# Patient Record
Sex: Male | Born: 1935 | Race: White | Hispanic: No | Smoking: Former smoker
Health system: Southern US, Community
[De-identification: ages and names within clinical notes are randomized; demographics above are authoritative.]

## PROBLEM LIST (undated history)

## (undated) DIAGNOSIS — E039 Hypothyroidism, unspecified: Secondary | ICD-10-CM

## (undated) DIAGNOSIS — I2510 Atherosclerotic heart disease of native coronary artery without angina pectoris: Secondary | ICD-10-CM

## (undated) DIAGNOSIS — N189 Chronic kidney disease, unspecified: Secondary | ICD-10-CM

## (undated) DIAGNOSIS — I252 Old myocardial infarction: Secondary | ICD-10-CM

## (undated) DIAGNOSIS — I10 Essential (primary) hypertension: Secondary | ICD-10-CM

## (undated) DIAGNOSIS — K219 Gastro-esophageal reflux disease without esophagitis: Secondary | ICD-10-CM

## (undated) DIAGNOSIS — E785 Hyperlipidemia, unspecified: Secondary | ICD-10-CM

## (undated) DIAGNOSIS — M069 Rheumatoid arthritis, unspecified: Secondary | ICD-10-CM

## (undated) HISTORY — DX: Hyperlipidemia, unspecified: E78.5

## (undated) HISTORY — DX: Rheumatoid arthritis, unspecified: M06.9

## (undated) HISTORY — DX: Hypothyroidism, unspecified: E03.9

## (undated) HISTORY — DX: Old myocardial infarction: I25.2

## (undated) HISTORY — PX: CHOLECYSTECTOMY: SHX55

## (undated) HISTORY — DX: Essential (primary) hypertension: I10

## (undated) HISTORY — DX: Atherosclerotic heart disease of native coronary artery without angina pectoris: I25.10

## (undated) HISTORY — PX: HEMILAMINOTOMY LUMBAR SPINE: SUR654

## (undated) HISTORY — DX: Chronic kidney disease, unspecified: N18.9

## (undated) HISTORY — DX: Gastro-esophageal reflux disease without esophagitis: K21.9

## (undated) SURGERY — Surgical Case
Anesthesia: *Unknown

---

## 2004-01-14 ENCOUNTER — Encounter: Admission: RE | Admit: 2004-01-14 | Discharge: 2004-01-14 | Payer: Self-pay | Admitting: Internal Medicine

## 2004-12-11 ENCOUNTER — Ambulatory Visit (HOSPITAL_COMMUNITY): Admission: RE | Admit: 2004-12-11 | Discharge: 2004-12-11 | Payer: Self-pay | Admitting: Gastroenterology

## 2010-09-02 ENCOUNTER — Encounter: Payer: Self-pay | Admitting: Neurosurgery

## 2013-02-09 ENCOUNTER — Other Ambulatory Visit: Payer: Self-pay | Admitting: Internal Medicine

## 2013-02-09 DIAGNOSIS — M549 Dorsalgia, unspecified: Secondary | ICD-10-CM

## 2013-02-15 ENCOUNTER — Ambulatory Visit
Admission: RE | Admit: 2013-02-15 | Discharge: 2013-02-15 | Disposition: A | Discharge status: Home / Self Care | Payer: Medicare Other | Source: Ambulatory Visit | Attending: Internal Medicine | Admitting: Internal Medicine

## 2013-02-15 DIAGNOSIS — M549 Dorsalgia, unspecified: Secondary | ICD-10-CM

## 2013-04-22 ENCOUNTER — Ambulatory Visit: Payer: Medicare Other | Admitting: Rehabilitation

## 2013-04-26 ENCOUNTER — Ambulatory Visit: Payer: Medicare Other | Attending: Neurosurgery | Admitting: Rehabilitation

## 2013-04-26 DIAGNOSIS — M545 Low back pain, unspecified: Secondary | ICD-10-CM | POA: Insufficient documentation

## 2013-04-26 DIAGNOSIS — IMO0001 Reserved for inherently not codable concepts without codable children: Secondary | ICD-10-CM | POA: Insufficient documentation

## 2013-04-29 ENCOUNTER — Ambulatory Visit: Payer: Medicare Other | Admitting: Rehabilitation

## 2013-05-03 ENCOUNTER — Ambulatory Visit: Payer: Medicare Other | Admitting: Rehabilitation

## 2013-05-06 ENCOUNTER — Ambulatory Visit: Payer: Medicare Other | Admitting: Rehabilitation

## 2013-05-10 ENCOUNTER — Ambulatory Visit: Payer: Medicare Other | Admitting: Rehabilitation

## 2013-05-13 ENCOUNTER — Ambulatory Visit: Payer: Medicare Other | Attending: Neurosurgery | Admitting: Rehabilitation

## 2013-05-13 DIAGNOSIS — M545 Low back pain, unspecified: Secondary | ICD-10-CM | POA: Insufficient documentation

## 2013-05-13 DIAGNOSIS — IMO0001 Reserved for inherently not codable concepts without codable children: Secondary | ICD-10-CM | POA: Insufficient documentation

## 2013-05-17 ENCOUNTER — Ambulatory Visit: Payer: Medicare Other | Admitting: Rehabilitation

## 2013-05-20 ENCOUNTER — Ambulatory Visit: Payer: Medicare Other | Admitting: Rehabilitation

## 2013-05-24 ENCOUNTER — Ambulatory Visit: Payer: Medicare Other | Admitting: Rehabilitation

## 2013-05-31 ENCOUNTER — Ambulatory Visit: Payer: Medicare Other | Admitting: Rehabilitation

## 2013-06-02 ENCOUNTER — Ambulatory Visit: Payer: Medicare Other | Admitting: Cardiology

## 2013-06-03 ENCOUNTER — Ambulatory Visit: Payer: Medicare Other | Admitting: Rehabilitation

## 2013-06-07 ENCOUNTER — Ambulatory Visit: Payer: Medicare Other | Admitting: Rehabilitation

## 2013-06-10 ENCOUNTER — Ambulatory Visit: Payer: Medicare Other | Admitting: Rehabilitation

## 2013-06-14 ENCOUNTER — Ambulatory Visit: Payer: Medicare Other | Admitting: Rehabilitation

## 2013-06-15 ENCOUNTER — Ambulatory Visit: Payer: Medicare Other | Attending: *Deleted | Admitting: Rehabilitation

## 2013-06-15 DIAGNOSIS — M545 Low back pain, unspecified: Secondary | ICD-10-CM | POA: Insufficient documentation

## 2013-06-15 DIAGNOSIS — IMO0001 Reserved for inherently not codable concepts without codable children: Secondary | ICD-10-CM | POA: Diagnosis present

## 2013-06-17 ENCOUNTER — Ambulatory Visit: Payer: Medicare Other | Admitting: Rehabilitation

## 2013-06-17 DIAGNOSIS — IMO0001 Reserved for inherently not codable concepts without codable children: Secondary | ICD-10-CM | POA: Diagnosis not present

## 2013-06-22 ENCOUNTER — Ambulatory Visit: Payer: Medicare Other | Admitting: Rehabilitation

## 2013-06-22 DIAGNOSIS — IMO0001 Reserved for inherently not codable concepts without codable children: Secondary | ICD-10-CM | POA: Diagnosis not present

## 2013-06-24 ENCOUNTER — Ambulatory Visit: Payer: Medicare Other | Admitting: Rehabilitation

## 2013-06-24 DIAGNOSIS — IMO0001 Reserved for inherently not codable concepts without codable children: Secondary | ICD-10-CM | POA: Diagnosis not present

## 2013-06-30 ENCOUNTER — Ambulatory Visit: Payer: Medicare Other | Admitting: Rehabilitation

## 2013-06-30 DIAGNOSIS — IMO0001 Reserved for inherently not codable concepts without codable children: Secondary | ICD-10-CM | POA: Diagnosis not present

## 2013-07-05 ENCOUNTER — Ambulatory Visit: Payer: Medicare Other | Admitting: Rehabilitation

## 2013-07-05 DIAGNOSIS — IMO0001 Reserved for inherently not codable concepts without codable children: Secondary | ICD-10-CM | POA: Diagnosis not present

## 2013-07-15 ENCOUNTER — Ambulatory Visit: Payer: Medicare Other | Attending: *Deleted | Admitting: Rehabilitation

## 2013-07-15 DIAGNOSIS — IMO0001 Reserved for inherently not codable concepts without codable children: Secondary | ICD-10-CM | POA: Insufficient documentation

## 2013-07-15 DIAGNOSIS — M545 Low back pain, unspecified: Secondary | ICD-10-CM | POA: Insufficient documentation

## 2013-07-19 ENCOUNTER — Encounter: Payer: Self-pay | Admitting: Cardiology

## 2013-07-19 ENCOUNTER — Encounter: Payer: Self-pay | Admitting: *Deleted

## 2013-07-20 ENCOUNTER — Ambulatory Visit: Payer: Medicare Other | Admitting: Rehabilitation

## 2013-07-20 ENCOUNTER — Ambulatory Visit (INDEPENDENT_AMBULATORY_CARE_PROVIDER_SITE_OTHER): Payer: Medicare Other | Admitting: Cardiology

## 2013-07-20 ENCOUNTER — Encounter: Payer: Self-pay | Admitting: Cardiology

## 2013-07-20 VITALS — BP 126/76 | HR 48 | Ht 69.0 in | Wt 196.0 lb

## 2013-07-20 DIAGNOSIS — I208 Other forms of angina pectoris: Secondary | ICD-10-CM

## 2013-07-20 DIAGNOSIS — I10 Essential (primary) hypertension: Secondary | ICD-10-CM

## 2013-07-20 DIAGNOSIS — I252 Old myocardial infarction: Secondary | ICD-10-CM

## 2013-07-20 DIAGNOSIS — I25810 Atherosclerosis of coronary artery bypass graft(s) without angina pectoris: Secondary | ICD-10-CM

## 2013-07-20 DIAGNOSIS — E785 Hyperlipidemia, unspecified: Secondary | ICD-10-CM

## 2013-07-20 NOTE — Progress Notes (Signed)
[address], [address] [place], [place]  [address] Phone: [phone] Fax:  [phone]  Date:  07/20/2013   ID:  Connor Hansen, DOB 04-22-1934, MRN [id number]  PCP:  Georgann Housekeeper, MD   History of Present Illness: Connor Hansen is a 79 y.o. male Several years ago he had a myocardial infarction treated with thrombolysis. Does not recall a heart catheterization at that time. More recently in 2010 was admitted to the hospital via emergency room with equivocal ST segment changes and chest discomfort. Cardiac catheterization at that time demonstrated a high grade mid LAD lesion in addition to a 99% subtotal length the mid right coronary artery lesion. It was difficult to ascertain which one was the culprit and a ventriculogram was done prior to intervention that demonstrated a modest size apical hypokinesis. Accordingly a stent was directed to the mid LAD lesion, bare-metal stent. The right coronary artery was also stented with a bare-metal stent at that time because it was felt that this lesion may occlude in a short period of time. This was done in February of 2010 with procedure performed at [facility]. Has established care here. Echocardiogram done on Jan 03, 2009 shows mild LVH, normal EF, distal anteroseptal and apical akinesis consistent with myocardial infarction, mild aortic sclerosis. Trivial mitral regurgitation and tricuspid regurgitation. Overall his LDL is 78, well controlled on atorvastatin 80. We went over goals. His hypertension is also well controlled. He occasionally will feel palpitations but this is quite rare. He has been having some back pain, this limits his playing of golf.   Wt Readings from Last 3 Encounters:  07/20/13 196 lb (88.905 kg)     Past Medical History  Diagnosis Date  . HTN (hypertension)   . GERD (gastroesophageal reflux disease)   . Hyperlipidemia   . Back pain   . Hypothyroidism   . Coronary atherosclerosis of native coronary artery   .  Obesity   . RA (rheumatoid arthritis)   . Palpitation   . CKD (chronic kidney disease)   . Old myocardial infarct   . CAD (coronary artery disease)     2/10 - BMS to mid LAD, RCA. Anteroapical akinesis - normal EF. 2010    Past Surgical History  Procedure Laterality Date  . Cholecystectomy    . Hemilaminotomy lumbar spine      Current Outpatient Prescriptions  Medication Sig Dispense Refill  . allopurinol (ZYLOPRIM) 100 MG tablet Take 100 mg by mouth daily.      Marland Kitchen aspirin 81 MG tablet Take 81 mg by mouth daily.      Marland Kitchen atorvastatin (LIPITOR) 80 MG tablet Take 80 mg by mouth daily.      . calcium & magnesium carbonates (MYLANTA) 311-232 MG per tablet Take 1 tablet by mouth daily.      . carvedilol (COREG) 6.25 MG tablet Take 6.25 mg by mouth 2 (two) times daily with a meal.      . Cholecalciferol (VITAMIN D) 400 UNITS capsule Take 400 Units by mouth daily.      . famotidine (PEPCID) 20 MG tablet Take 20 mg by mouth 2 (two) times daily.      . Flaxseed Oil OIL 1,000 mg by Does not apply route daily.      . folic acid (FOLVITE) 1 MG tablet Take 2 mg by mouth 2 (two) times daily.      Marland Kitchen levothyroxine (SYNTHROID, LEVOTHROID) 75 MCG tablet Take 75 mcg by mouth  daily before breakfast.      . methotrexate (RHEUMATREX) 2.5 MG tablet Take 20 mg by mouth once a week. Caution:Chemotherapy. Protect from light.      . Multiple Vitamin (MULTIVITAMIN) tablet Take 1 tablet by mouth daily.      . Multiple Vitamins-Minerals (PRESERVISION/LUTEIN PO) Take 500 mg by mouth daily.      . Omega-3 Fatty Acids (FISH OIL PO) Take 1,000 capsules by mouth daily.      Marland Kitchen omeprazole (PRILOSEC) 20 MG capsule Take 20 mg by mouth daily.      . traMADol (ULTRAM) 50 MG tablet Take 50 mg by mouth as needed.       No current facility-administered medications for this visit.    Allergies:   No Known Allergies  Social History:  The patient  reports that he has quit smoking. His smoking use included Cigarettes. He smoked  0.00 packs per day for 25 years. He does not have any smokeless tobacco history on file. He reports that he drinks alcohol. He reports that he does not use illicit drugs.   ROS:  Please see the history of present illness.   No syncope, no dizziness, no bleeding, no orthopnea, no PND    PHYSICAL EXAM: VS:  BP 126/76  Pulse 48  Ht 5\' 9"  (1.753 m)  Wt 196 lb (88.905 kg)  BMI 28.93 kg/m2 Well nourished, well developed, in no acute distress HEENT: normal Neck: no JVD Cardiac:  normal S1, S2, mildly bradycardic; RRR; no murmur Lungs:  clear to auscultation bilaterally, no wheezing, rhonchi or rales Abd: soft, nontender, no hepatomegaly Ext: no edema Skin: warm and dry Neuro: no focal abnormalities noted  EKG:  Sinus bradycardia rate 48, inferior infarct pattern, poor R wave progression     ASSESSMENT AND PLAN:  1.  coronary artery disease-status post PCI-currently doing very well, no anginal symptoms. Continue with aggressive secondary prevention. 2. Angina-well controlled. We discussed the possibility of repeat revascularization in the future if necessary. Hopefully with aggressive secondary prevention this may not be an issue. 3. Hyperlipidemia-continue with high-dose statin therapy. No changes made. LDL excellent in may of 2014. Lab work reviewed. 4. Sinus bradycardia-heart rate on EKG today 48 when laying down, when sitting up he was in the upper 50s. At previous doctor visits he has been in the 60s and 70s. I will not make any adjustments to his carvedilol, low-dose currently. We will continue to monitor. He is not having any active symptoms with this.  Signed, Donato Schultz, MD [facility]  07/20/2013 11:05 AM

## 2013-07-20 NOTE — Patient Instructions (Signed)
Your physician recommends that you continue on your current medications as directed. Please refer to the Current Medication list given to you today.  Your physician wants you to follow-up in: 1 year follow-up. You will receive a reminder letter in the mail two months in advance. If you don't receive a letter, please call our office to schedule the follow-up appointment.

## 2013-07-27 ENCOUNTER — Ambulatory Visit: Payer: Medicare Other | Admitting: Rehabilitation

## 2013-08-02 ENCOUNTER — Ambulatory Visit: Payer: Medicare Other | Admitting: Rehabilitation

## 2013-08-11 ENCOUNTER — Ambulatory Visit: Payer: Medicare Other | Admitting: Rehabilitation

## 2014-02-15 ENCOUNTER — Other Ambulatory Visit: Payer: Self-pay | Admitting: *Deleted

## 2014-02-15 MED ORDER — CARVEDILOL 6.25 MG PO TABS: 6.2500 mg | ORAL_TABLET | Freq: Two times a day (BID) | ORAL | Status: DC

## 2014-06-09 ENCOUNTER — Telehealth: Payer: Self-pay | Admitting: Cardiology

## 2014-06-09 NOTE — Telephone Encounter (Signed)
Received request from Nurse fax box, documents faxed for surgical clearance. To: [facility]  Fax number: [phone] Attention: 10.29.15/km

## 2014-07-20 ENCOUNTER — Encounter: Payer: Self-pay | Admitting: Cardiology

## 2014-07-20 ENCOUNTER — Ambulatory Visit (INDEPENDENT_AMBULATORY_CARE_PROVIDER_SITE_OTHER): Payer: Medicare Other | Admitting: Cardiology

## 2014-07-20 VITALS — BP 120/78 | HR 55 | Ht 69.0 in | Wt 188.0 lb

## 2014-07-20 DIAGNOSIS — I208 Other forms of angina pectoris: Secondary | ICD-10-CM

## 2014-07-20 DIAGNOSIS — I2510 Atherosclerotic heart disease of native coronary artery without angina pectoris: Secondary | ICD-10-CM | POA: Insufficient documentation

## 2014-07-20 DIAGNOSIS — I2583 Coronary atherosclerosis due to lipid rich plaque: Principal | ICD-10-CM

## 2014-07-20 DIAGNOSIS — E785 Hyperlipidemia, unspecified: Secondary | ICD-10-CM

## 2014-07-20 DIAGNOSIS — I10 Essential (primary) hypertension: Secondary | ICD-10-CM

## 2014-07-20 NOTE — Progress Notes (Signed)
[place]. [address], [address] [place], [place]  [address] Phone: [phone] Fax:  [phone]  Date:  07/20/2014   ID:  Connor Hansen, DOB 02-May-1934, MRN [id number]  PCP:  Wenda Low, MD   History of Present Illness: Connor Hansen is a 80 y.o. male who several years ago he had a myocardial infarction treated with thrombolysis. Does not recall a heart catheterization at that time. More recently in 2010 was admitted to the hospital via emergency room with equivocal ST segment changes and chest discomfort. Cardiac catheterization in 2010 at that time demonstrated a high grade mid LAD lesion in addition to a 99% subtotal length the mid right coronary artery lesion. It was difficult to ascertain which one was the culprit and a ventriculogram was done prior to intervention that demonstrated a modest size apical hypokinesis. Accordingly a stent was directed to the mid LAD lesion, bare-metal stent. The right coronary artery was also stented with a bare-metal stent at that time because it was felt that this lesion may occlude in a short period of time. This was done in February of 2010 with procedure performed at [facility].  Has established care here. Echocardiogram done on Jan 03, 2009 shows mild LVH, normal EF, distal anteroseptal and apical akinesis consistent with myocardial infarction, mild aortic sclerosis. Trivial mitral regurgitation and tricuspid regurgitation.  Overall his LDL has been 78, well controlled on atorvastatin 80. We went over goals. His hypertension is also well controlled. He occasionally will feel palpitations but this is quite rare. He has been having some back pain, this limits his playing of golf.  Cataract surgery Monday. No chest pain, no anginal symptoms. Doing well.   Wt Readings from Last 3 Encounters:  07/20/14 188 lb (85.276 kg)  07/20/13 196 lb (88.905 kg)     Past Medical History  Diagnosis Date  . HTN (hypertension)   . GERD (gastroesophageal  reflux disease)   . Hyperlipidemia   . Back pain   . Hypothyroidism   . Coronary atherosclerosis of native coronary artery   . Obesity   . RA (rheumatoid arthritis)   . Palpitation   . CKD (chronic kidney disease)   . Old myocardial infarct   . CAD (coronary artery disease)     2/10 - BMS to mid LAD, RCA. Anteroapical akinesis - normal EF. 2010    Past Surgical History  Procedure Laterality Date  . Cholecystectomy    . Hemilaminotomy lumbar spine      Current Outpatient Prescriptions  Medication Sig Dispense Refill  . allopurinol (ZYLOPRIM) 100 MG tablet Take 100 mg by mouth daily.    Marland Kitchen aspirin 81 MG tablet Take 81 mg by mouth daily.    Marland Kitchen atorvastatin (LIPITOR) 80 MG tablet Take 80 mg by mouth daily.    . calcium & magnesium carbonates (MYLANTA) 311-232 MG per tablet Take 1 tablet by mouth daily.    . carvedilol (COREG) 6.25 MG tablet Take 1 tablet (6.25 mg total) by mouth 2 (two) times daily with a meal. 180 tablet 2  . Cholecalciferol (VITAMIN D) 400 UNITS capsule Take 400 Units by mouth daily.    . famotidine (PEPCID) 20 MG tablet Take 20 mg by mouth 2 (two) times daily.    . Flaxseed Oil OIL 1,000 mg by Does not apply route daily.    . folic acid (FOLVITE) 1 MG tablet Take 2 mg by mouth 2 (two) times daily.    Marland Kitchen levothyroxine (  SYNTHROID, LEVOTHROID) 75 MCG tablet Take 75 mcg by mouth daily before breakfast.    . methotrexate (RHEUMATREX) 2.5 MG tablet Take 20 mg by mouth once a week. Caution:Chemotherapy. Protect from light.    . Multiple Vitamin (MULTIVITAMIN) tablet Take 1 tablet by mouth daily.    . Multiple Vitamins-Minerals (PRESERVISION/LUTEIN PO) Take 500 mg by mouth daily.    . Omega-3 Fatty Acids (FISH OIL PO) Take 1,000 capsules by mouth daily.    Marland Kitchen omeprazole (PRILOSEC) 20 MG capsule Take 20 mg by mouth daily.    . traMADol (ULTRAM) 50 MG tablet Take 50 mg by mouth as needed.     No current facility-administered medications for this visit.    Allergies:   No  Known Allergies  Social History:  The patient  reports that he has quit smoking. His smoking use included Cigarettes. He smoked 0.00 packs per day for 25 years. He does not have any smokeless tobacco history on file. He reports that he drinks alcohol. He reports that he does not use illicit drugs.   ROS:  Please see the history of present illness.   No syncope, no dizziness, no bleeding, no orthopnea, no PND    PHYSICAL EXAM: VS:  BP 120/78 mmHg  Pulse 55  Ht 5\' 9"  (1.753 m)  Wt 188 lb (85.276 kg)  BMI 27.75 kg/m2 Well nourished, well developed, in no acute distress HEENT: normal Neck: no JVD Cardiac:  normal S1, S2, mildly bradycardic; RRR; no murmur Lungs:  clear to auscultation bilaterally, no wheezing, rhonchi or rales Abd: soft, nontender, no hepatomegaly Ext: no edema Skin: warm and dry Neuro: no focal abnormalities noted  EKG:  07/20/14-sinus bradycardia rate 55, inferior infarct pattern-previously Sinus bradycardia rate 48, inferior infarct pattern, poor R wave progression     ASSESSMENT AND PLAN:  1. Preoperative risk stratification-cataract surgery, low risk, he may proceed. 2. Coronary artery disease-status post PCI-currently doing very well, no anginal symptoms. Continue with aggressive secondary prevention. 3. Angina-well controlled. We discussed the possibility of repeat revascularization in the future if necessary. Hopefully with aggressive secondary prevention this may not be an issue.  4. Hyperlipidemia-continue with high-dose statin therapy. No changes made. LDL excellent in May of 2014. Prior lab work reviewed. 5. Sinus bradycardia-mild. Overall doing well, no symptoms. We will continue to monitor. He is not having any active symptoms with this. 6. One-year follow-up  Signed, Candee Furbish, MD [facility]  07/20/2014 9:36 AM

## 2014-07-20 NOTE — Patient Instructions (Signed)
Your physician recommends that you continue on your current medications as directed. Please refer to the Current Medication list given to you today.  You can proceed with cataract surgery.  Your physician wants you to follow-up in: 1 YEAR with Dr Marlou Porch.  You will receive a reminder letter in the mail two months in advance. If you don't receive a letter, please call our office to schedule the follow-up appointment.

## 2014-10-26 ENCOUNTER — Other Ambulatory Visit: Payer: Self-pay

## 2014-10-26 MED ORDER — CARVEDILOL 6.25 MG PO TABS: 6.2500 mg | ORAL_TABLET | Freq: Two times a day (BID) | ORAL | Status: DC

## 2015-05-01 ENCOUNTER — Ambulatory Visit
Admission: RE | Admit: 2015-05-01 | Discharge: 2015-05-01 | Disposition: A | Discharge status: Home / Self Care | Payer: Medicare Other | Source: Ambulatory Visit | Attending: Internal Medicine | Admitting: Internal Medicine

## 2015-05-01 ENCOUNTER — Other Ambulatory Visit: Payer: Self-pay | Admitting: Internal Medicine

## 2015-05-01 DIAGNOSIS — R05 Cough: Secondary | ICD-10-CM

## 2015-05-01 DIAGNOSIS — R059 Cough, unspecified: Secondary | ICD-10-CM

## 2015-09-18 ENCOUNTER — Encounter: Payer: Self-pay | Admitting: Cardiology

## 2015-09-19 DIAGNOSIS — H353231 Exudative age-related macular degeneration, bilateral, with active choroidal neovascularization: Secondary | ICD-10-CM | POA: Diagnosis not present

## 2015-09-19 DIAGNOSIS — H43813 Vitreous degeneration, bilateral: Secondary | ICD-10-CM | POA: Diagnosis not present

## 2015-09-19 DIAGNOSIS — H35433 Paving stone degeneration of retina, bilateral: Secondary | ICD-10-CM | POA: Diagnosis not present

## 2015-09-27 ENCOUNTER — Ambulatory Visit (INDEPENDENT_AMBULATORY_CARE_PROVIDER_SITE_OTHER): Payer: PPO | Admitting: Cardiology

## 2015-09-27 ENCOUNTER — Encounter: Payer: Self-pay | Admitting: Cardiology

## 2015-09-27 VITALS — BP 118/68 | HR 54 | Ht 69.0 in | Wt 186.4 lb

## 2015-09-27 DIAGNOSIS — I208 Other forms of angina pectoris: Secondary | ICD-10-CM | POA: Diagnosis not present

## 2015-09-27 DIAGNOSIS — I10 Essential (primary) hypertension: Secondary | ICD-10-CM | POA: Diagnosis not present

## 2015-09-27 DIAGNOSIS — N400 Enlarged prostate without lower urinary tract symptoms: Secondary | ICD-10-CM | POA: Diagnosis not present

## 2015-09-27 DIAGNOSIS — I2583 Coronary atherosclerosis due to lipid rich plaque: Principal | ICD-10-CM

## 2015-09-27 DIAGNOSIS — E785 Hyperlipidemia, unspecified: Secondary | ICD-10-CM | POA: Diagnosis not present

## 2015-09-27 DIAGNOSIS — R001 Bradycardia, unspecified: Secondary | ICD-10-CM

## 2015-09-27 DIAGNOSIS — Z0000 Encounter for general adult medical examination without abnormal findings: Secondary | ICD-10-CM | POA: Diagnosis not present

## 2015-09-27 DIAGNOSIS — N4231 Prostatic intraepithelial neoplasia: Secondary | ICD-10-CM | POA: Diagnosis not present

## 2015-09-27 DIAGNOSIS — I2510 Atherosclerotic heart disease of native coronary artery without angina pectoris: Secondary | ICD-10-CM

## 2015-09-27 DIAGNOSIS — R972 Elevated prostate specific antigen [PSA]: Secondary | ICD-10-CM | POA: Diagnosis not present

## 2015-09-27 MED ORDER — CARVEDILOL 3.125 MG PO TABS: 3.1250 mg | ORAL_TABLET | Freq: Two times a day (BID) | ORAL | Status: DC

## 2015-09-27 NOTE — Progress Notes (Signed)
[place]. [address] [place], [place]  [address] Phone: [phone] Fax:  [phone]  Date:  09/27/2015   ID:  Connor Hansen, DOB 01-17-1934, MRN [id number]  PCP:  Wenda Low, MD   History of Present Illness: Connor Hansen is a 82 y.o. male who several years ago he had a myocardial infarction treated with thrombolysis. Does not recall a heart catheterization at that time. More recently in 2010 was admitted to the hospital via emergency room with equivocal ST segment changes and chest discomfort. Cardiac catheterization in 2010 at that time demonstrated a high grade mid LAD lesion in addition to a 99% subtotal length the mid right coronary artery lesion. It was difficult to ascertain which one was the culprit and a ventriculogram was done prior to intervention that demonstrated a modest size apical hypokinesis. Accordingly a stent was directed to the mid LAD lesion, bare-metal stent. The right coronary artery was also stented with a bare-metal stent at that time because it was felt that this lesion may occlude in a short period of time. This was done in February of 2010 with procedure performed at [facility].  1992 MI in [place].  Has established care here. Echocardiogram done on Jan 03, 2009 shows mild LVH, normal EF, distal anteroseptal and apical akinesis consistent with myocardial infarction, mild aortic sclerosis. Trivial mitral regurgitation and tricuspid regurgitation.  Overall his LDL has been 78, well controlled on atorvastatin 80. We went over goals. His hypertension is also well controlled. He occasionally will feel palpitations but this is quite rare. He has been having some back pain, this limits his playing of golf.   09/27/15 - No chest pain, no anginal symptoms. Doing well. Macular degeneration. Prior cataract surgery. No symptoms from bradycardia.   Wt Readings from Last 3 Encounters:  09/27/15 186 lb 6.4 oz (84.55 kg)  07/20/14 188 lb (85.276 kg)    07/20/13 196 lb (88.905 kg)     Past Medical History  Diagnosis Date  . HTN (hypertension)   . GERD (gastroesophageal reflux disease)   . Hyperlipidemia   . Back pain   . Hypothyroidism   . Coronary atherosclerosis of native coronary artery   . Obesity   . RA (rheumatoid arthritis) ([place])   . Palpitation   . CKD (chronic kidney disease)   . Old myocardial infarct   . CAD (coronary artery disease)     2/10 - BMS to mid LAD, RCA. Anteroapical akinesis - normal EF. 2010    Past Surgical History  Procedure Laterality Date  . Cholecystectomy    . Hemilaminotomy lumbar spine      Current Outpatient Prescriptions  Medication Sig Dispense Refill  . allopurinol (ZYLOPRIM) 100 MG tablet Take 100 mg by mouth daily.    Marland Kitchen aspirin 81 MG tablet Take 81 mg by mouth daily.    Marland Kitchen atorvastatin (LIPITOR) 80 MG tablet Take 80 mg by mouth daily.    . calcium & magnesium carbonates (MYLANTA) 311-232 MG per tablet Take 1 tablet by mouth daily.    . carvedilol (COREG) 6.25 MG tablet Take 1 tablet (6.25 mg total) by mouth 2 (two) times daily with a meal. 180 tablet 4  . Cholecalciferol (VITAMIN D) 400 UNITS capsule Take 400 Units by mouth daily.    . famotidine (PEPCID) 20 MG tablet Take 20 mg by mouth 2 (two) times daily.    . folic acid (FOLVITE) 1 MG tablet Take 2 mg by mouth  2 (two) times daily.    Marland Kitchen levothyroxine (SYNTHROID, LEVOTHROID) 75 MCG tablet Take 75 mcg by mouth daily before breakfast.    . methotrexate (RHEUMATREX) 2.5 MG tablet Take 20 mg by mouth once a week. Caution:Chemotherapy. Protect from light.    . Multiple Vitamin (MULTIVITAMIN) tablet Take 1 tablet by mouth daily.    . Multiple Vitamins-Minerals (PRESERVISION/LUTEIN PO) Take 500 mg by mouth daily.    . Omega-3 Fatty Acids (FISH OIL PO) Take 1,000 capsules by mouth daily.    Marland Kitchen omeprazole (PRILOSEC) 20 MG capsule Take 20 mg by mouth daily.    . traMADol (ULTRAM) 50 MG tablet Take 50 mg by mouth as needed.     No current  facility-administered medications for this visit.    Allergies:   No Known Allergies  Social History:  The patient  reports that he has quit smoking. His smoking use included Cigarettes. He quit after 25 years of use. He does not have any smokeless tobacco history on file. He reports that he drinks alcohol. He reports that he does not use illicit drugs.   ROS:  Please see the history of present illness.   No syncope, no dizziness, no bleeding, no orthopnea, no PND    PHYSICAL EXAM: VS:  BP 118/68 mmHg  Pulse 54  Ht 5\' 9"  (1.753 m)  Wt 186 lb 6.4 oz (84.55 kg)  BMI 27.51 kg/m2 Well nourished, well developed, in no acute distress HEENT: normal Neck: no JVD Cardiac:  normal S1, S2, mildly bradycardic; RRR; no murmur Lungs:  clear to auscultation bilaterally, no wheezing, rhonchi or rales Abd: soft, nontender, no hepatomegaly Ext: no edema Skin: warm and dry Neuro: no focal abnormalities noted  EKG:  07/20/14-sinus bradycardia rate 55, inferior infarct pattern-previously Sinus bradycardia rate 48, inferior infarct pattern, poor R wave progression     ASSESSMENT AND PLAN:   1. Coronary artery disease-status post PCI-currently doing very well, no anginal symptoms. Continue with aggressive secondary prevention. 2. Angina-well controlled. We discussed the possibility of repeat revascularization in the future if necessary. Hopefully with aggressive secondary prevention this may not be an issue.  3. Hyperlipidemia-continue with high-dose statin therapy. No changes made. LDL excellent  63. Prior lab work reviewed. 4. Sinus bradycardia-mild. Overall doing well, no symptoms. We will continue to monitor. He is not having any active symptoms with this. I will however decrease his carvedilol to 3.125 mg twice a day. 5. One-year follow-up  Signed, Candee Furbish, MD [facility]  09/27/2015 4:19 PM

## 2015-09-27 NOTE — Patient Instructions (Signed)
Medication Instructions:  Please decrease your Carvedilol to 3.125 mg twice a day. Continue all other medications as listed.  Follow-Up: Follow up in 1 year with Dr. Marlou Porch.  You will receive a letter in the mail 2 months before you are due.  Please call us when you receive this letter to schedule your follow up appointment.  If you need a refill on your cardiac medications before your next appointment, please call your pharmacy.  Thank you for choosing [facility]!!

## 2015-10-17 DIAGNOSIS — B351 Tinea unguium: Secondary | ICD-10-CM | POA: Diagnosis not present

## 2015-10-17 DIAGNOSIS — Z85828 Personal history of other malignant neoplasm of skin: Secondary | ICD-10-CM | POA: Diagnosis not present

## 2015-10-17 DIAGNOSIS — L821 Other seborrheic keratosis: Secondary | ICD-10-CM | POA: Diagnosis not present

## 2015-10-17 DIAGNOSIS — D1801 Hemangioma of skin and subcutaneous tissue: Secondary | ICD-10-CM | POA: Diagnosis not present

## 2015-10-18 DIAGNOSIS — M159 Polyosteoarthritis, unspecified: Secondary | ICD-10-CM | POA: Diagnosis not present

## 2015-10-18 DIAGNOSIS — M79642 Pain in left hand: Secondary | ICD-10-CM | POA: Diagnosis not present

## 2015-10-18 DIAGNOSIS — M79641 Pain in right hand: Secondary | ICD-10-CM | POA: Diagnosis not present

## 2015-10-18 DIAGNOSIS — M1009 Idiopathic gout, multiple sites: Secondary | ICD-10-CM | POA: Diagnosis not present

## 2015-10-18 DIAGNOSIS — M0609 Rheumatoid arthritis without rheumatoid factor, multiple sites: Secondary | ICD-10-CM | POA: Diagnosis not present

## 2015-10-18 DIAGNOSIS — Z79899 Other long term (current) drug therapy: Secondary | ICD-10-CM | POA: Diagnosis not present

## 2015-11-15 DIAGNOSIS — H353231 Exudative age-related macular degeneration, bilateral, with active choroidal neovascularization: Secondary | ICD-10-CM | POA: Diagnosis not present

## 2015-11-15 DIAGNOSIS — B351 Tinea unguium: Secondary | ICD-10-CM | POA: Diagnosis not present

## 2015-11-15 DIAGNOSIS — Z79899 Other long term (current) drug therapy: Secondary | ICD-10-CM | POA: Diagnosis not present

## 2015-11-15 DIAGNOSIS — H43813 Vitreous degeneration, bilateral: Secondary | ICD-10-CM | POA: Diagnosis not present

## 2015-11-15 DIAGNOSIS — H35433 Paving stone degeneration of retina, bilateral: Secondary | ICD-10-CM | POA: Diagnosis not present

## 2016-01-12 DIAGNOSIS — Z79899 Other long term (current) drug therapy: Secondary | ICD-10-CM | POA: Diagnosis not present

## 2016-01-12 DIAGNOSIS — M1009 Idiopathic gout, multiple sites: Secondary | ICD-10-CM | POA: Diagnosis not present

## 2016-01-12 DIAGNOSIS — M159 Polyosteoarthritis, unspecified: Secondary | ICD-10-CM | POA: Diagnosis not present

## 2016-01-12 DIAGNOSIS — M79641 Pain in right hand: Secondary | ICD-10-CM | POA: Diagnosis not present

## 2016-01-12 DIAGNOSIS — M79642 Pain in left hand: Secondary | ICD-10-CM | POA: Diagnosis not present

## 2016-01-12 DIAGNOSIS — M0609 Rheumatoid arthritis without rheumatoid factor, multiple sites: Secondary | ICD-10-CM | POA: Diagnosis not present

## 2016-01-16 DIAGNOSIS — Z79899 Other long term (current) drug therapy: Secondary | ICD-10-CM | POA: Diagnosis not present

## 2016-01-17 DIAGNOSIS — H353231 Exudative age-related macular degeneration, bilateral, with active choroidal neovascularization: Secondary | ICD-10-CM | POA: Diagnosis not present

## 2016-03-20 DIAGNOSIS — H353231 Exudative age-related macular degeneration, bilateral, with active choroidal neovascularization: Secondary | ICD-10-CM | POA: Diagnosis not present

## 2016-03-20 DIAGNOSIS — H35433 Paving stone degeneration of retina, bilateral: Secondary | ICD-10-CM | POA: Diagnosis not present

## 2016-03-20 DIAGNOSIS — H43813 Vitreous degeneration, bilateral: Secondary | ICD-10-CM | POA: Diagnosis not present

## 2016-04-17 DIAGNOSIS — M0609 Rheumatoid arthritis without rheumatoid factor, multiple sites: Secondary | ICD-10-CM | POA: Diagnosis not present

## 2016-04-17 DIAGNOSIS — M159 Polyosteoarthritis, unspecified: Secondary | ICD-10-CM | POA: Diagnosis not present

## 2016-04-17 DIAGNOSIS — M1009 Idiopathic gout, multiple sites: Secondary | ICD-10-CM | POA: Diagnosis not present

## 2016-04-17 DIAGNOSIS — M79641 Pain in right hand: Secondary | ICD-10-CM | POA: Diagnosis not present

## 2016-04-17 DIAGNOSIS — M79642 Pain in left hand: Secondary | ICD-10-CM | POA: Diagnosis not present

## 2016-04-17 DIAGNOSIS — Z79899 Other long term (current) drug therapy: Secondary | ICD-10-CM | POA: Diagnosis not present

## 2016-05-22 DIAGNOSIS — Z23 Encounter for immunization: Secondary | ICD-10-CM | POA: Diagnosis not present

## 2016-05-29 DIAGNOSIS — H35423 Microcystoid degeneration of retina, bilateral: Secondary | ICD-10-CM | POA: Diagnosis not present

## 2016-05-29 DIAGNOSIS — H43813 Vitreous degeneration, bilateral: Secondary | ICD-10-CM | POA: Diagnosis not present

## 2016-05-29 DIAGNOSIS — H35433 Paving stone degeneration of retina, bilateral: Secondary | ICD-10-CM | POA: Diagnosis not present

## 2016-05-29 DIAGNOSIS — H353231 Exudative age-related macular degeneration, bilateral, with active choroidal neovascularization: Secondary | ICD-10-CM | POA: Diagnosis not present

## 2016-07-12 DIAGNOSIS — R972 Elevated prostate specific antigen [PSA]: Secondary | ICD-10-CM | POA: Diagnosis not present

## 2016-07-12 DIAGNOSIS — K219 Gastro-esophageal reflux disease without esophagitis: Secondary | ICD-10-CM | POA: Diagnosis not present

## 2016-07-12 DIAGNOSIS — I10 Essential (primary) hypertension: Secondary | ICD-10-CM | POA: Diagnosis not present

## 2016-07-12 DIAGNOSIS — E039 Hypothyroidism, unspecified: Secondary | ICD-10-CM | POA: Diagnosis not present

## 2016-07-12 DIAGNOSIS — Z1389 Encounter for screening for other disorder: Secondary | ICD-10-CM | POA: Diagnosis not present

## 2016-07-12 DIAGNOSIS — M109 Gout, unspecified: Secondary | ICD-10-CM | POA: Diagnosis not present

## 2016-07-12 DIAGNOSIS — Z0000 Encounter for general adult medical examination without abnormal findings: Secondary | ICD-10-CM | POA: Diagnosis not present

## 2016-07-12 DIAGNOSIS — R7303 Prediabetes: Secondary | ICD-10-CM | POA: Diagnosis not present

## 2016-07-12 DIAGNOSIS — E782 Mixed hyperlipidemia: Secondary | ICD-10-CM | POA: Diagnosis not present

## 2016-07-12 DIAGNOSIS — I252 Old myocardial infarction: Secondary | ICD-10-CM | POA: Diagnosis not present

## 2016-07-12 DIAGNOSIS — M069 Rheumatoid arthritis, unspecified: Secondary | ICD-10-CM | POA: Diagnosis not present

## 2016-07-12 DIAGNOSIS — Z23 Encounter for immunization: Secondary | ICD-10-CM | POA: Diagnosis not present

## 2016-08-14 DIAGNOSIS — H353231 Exudative age-related macular degeneration, bilateral, with active choroidal neovascularization: Secondary | ICD-10-CM | POA: Diagnosis not present

## 2016-08-14 DIAGNOSIS — H35433 Paving stone degeneration of retina, bilateral: Secondary | ICD-10-CM | POA: Diagnosis not present

## 2016-08-14 DIAGNOSIS — H43813 Vitreous degeneration, bilateral: Secondary | ICD-10-CM | POA: Diagnosis not present

## 2016-08-14 DIAGNOSIS — H35423 Microcystoid degeneration of retina, bilateral: Secondary | ICD-10-CM | POA: Diagnosis not present

## 2016-10-16 DIAGNOSIS — M159 Polyosteoarthritis, unspecified: Secondary | ICD-10-CM | POA: Diagnosis not present

## 2016-10-16 DIAGNOSIS — M0609 Rheumatoid arthritis without rheumatoid factor, multiple sites: Secondary | ICD-10-CM | POA: Diagnosis not present

## 2016-10-16 DIAGNOSIS — M2550 Pain in unspecified joint: Secondary | ICD-10-CM | POA: Diagnosis not present

## 2016-10-16 DIAGNOSIS — M1009 Idiopathic gout, multiple sites: Secondary | ICD-10-CM | POA: Diagnosis not present

## 2016-10-16 DIAGNOSIS — Z79899 Other long term (current) drug therapy: Secondary | ICD-10-CM | POA: Diagnosis not present

## 2016-10-29 DIAGNOSIS — L820 Inflamed seborrheic keratosis: Secondary | ICD-10-CM | POA: Diagnosis not present

## 2016-10-29 DIAGNOSIS — Z85828 Personal history of other malignant neoplasm of skin: Secondary | ICD-10-CM | POA: Diagnosis not present

## 2016-10-29 DIAGNOSIS — D1801 Hemangioma of skin and subcutaneous tissue: Secondary | ICD-10-CM | POA: Diagnosis not present

## 2016-10-29 DIAGNOSIS — L821 Other seborrheic keratosis: Secondary | ICD-10-CM | POA: Diagnosis not present

## 2016-11-07 ENCOUNTER — Encounter: Payer: Self-pay | Admitting: *Deleted

## 2016-11-20 DIAGNOSIS — H35433 Paving stone degeneration of retina, bilateral: Secondary | ICD-10-CM | POA: Diagnosis not present

## 2016-11-20 DIAGNOSIS — H35423 Microcystoid degeneration of retina, bilateral: Secondary | ICD-10-CM | POA: Diagnosis not present

## 2016-11-20 DIAGNOSIS — H43813 Vitreous degeneration, bilateral: Secondary | ICD-10-CM | POA: Diagnosis not present

## 2016-11-20 DIAGNOSIS — H353231 Exudative age-related macular degeneration, bilateral, with active choroidal neovascularization: Secondary | ICD-10-CM | POA: Diagnosis not present

## 2016-11-22 ENCOUNTER — Ambulatory Visit: Payer: PPO | Admitting: Cardiology

## 2016-12-12 ENCOUNTER — Ambulatory Visit (INDEPENDENT_AMBULATORY_CARE_PROVIDER_SITE_OTHER): Payer: PPO | Admitting: Cardiology

## 2016-12-12 ENCOUNTER — Encounter: Payer: Self-pay | Admitting: Cardiology

## 2016-12-12 ENCOUNTER — Ambulatory Visit: Payer: PPO | Admitting: Cardiology

## 2016-12-12 ENCOUNTER — Encounter (INDEPENDENT_AMBULATORY_CARE_PROVIDER_SITE_OTHER): Payer: Self-pay

## 2016-12-12 VITALS — BP 120/68 | HR 61 | Ht 69.0 in | Wt 180.4 lb

## 2016-12-12 DIAGNOSIS — E7800 Pure hypercholesterolemia, unspecified: Secondary | ICD-10-CM

## 2016-12-12 DIAGNOSIS — I208 Other forms of angina pectoris: Secondary | ICD-10-CM | POA: Diagnosis not present

## 2016-12-12 DIAGNOSIS — I2510 Atherosclerotic heart disease of native coronary artery without angina pectoris: Secondary | ICD-10-CM | POA: Diagnosis not present

## 2016-12-12 DIAGNOSIS — I2583 Coronary atherosclerosis due to lipid rich plaque: Secondary | ICD-10-CM | POA: Diagnosis not present

## 2016-12-12 DIAGNOSIS — R001 Bradycardia, unspecified: Secondary | ICD-10-CM | POA: Diagnosis not present

## 2016-12-12 DIAGNOSIS — I2089 Other forms of angina pectoris: Secondary | ICD-10-CM

## 2016-12-12 NOTE — Progress Notes (Signed)
[facility]. [address], [address] [place], [place]  [address] Phone: [phone] Fax:  [phone]  Date:  12/12/2016   ID:  Connor Hansen, DOB 07-03-1934, MRN [id number]  PCP:  Wenda Low, MD   History of Present Illness: Connor Hansen is a 83 y.o. male who several years ago he had a myocardial infarction treated with thrombolysis. Does not recall a heart catheterization at that time. More recently in 2010 was admitted to the hospital via emergency room with equivocal ST segment changes and chest discomfort. Cardiac catheterization in 2010 at that time demonstrated a high grade mid LAD lesion in addition to a 99% subtotal length the mid right coronary artery lesion. It was difficult to ascertain which one was the culprit and a ventriculogram was done prior to intervention that demonstrated a modest size apical hypokinesis. Accordingly a stent was directed to the mid LAD lesion, bare-metal stent. The right coronary artery was also stented with a bare-metal stent at that time because it was felt that this lesion may occlude in a short period of time. This was done in February of 2010 with procedure performed at [facility].  1992 MI in [place].  Has established care here. Echocardiogram done on Jan 03, 2009 shows mild LVH, normal EF, distal anteroseptal and apical akinesis consistent with myocardial infarction, mild aortic sclerosis. Trivial mitral regurgitation and tricuspid regurgitation.  Overall his LDL has been 78, well controlled on atorvastatin 80. We went over goals. His hypertension is also well controlled. He occasionally will feel palpitations but this is quite rare. He has been having some back pain, this limits his playing of golf.   09/27/15 - No chest pain, no anginal symptoms. Doing well. Macular degeneration. Prior cataract surgery. No symptoms from bradycardia.  12/12/16-overall he is doing very well. Still playing golf. No active exertional symptoms. Sometimes he has mild  shortness of breath with increased activity. Twice a year he can feel his heart race but this is very rare. No chest pain.  Wt Readings from Last 3 Encounters:  12/12/16 180 lb 6.4 oz (81.8 kg)  09/27/15 186 lb 6.4 oz (84.6 kg)  07/20/14 188 lb (85.3 kg)     Past Medical History:  Diagnosis Date  . Back pain   . CAD (coronary artery disease)    2/10 - BMS to mid LAD, RCA. Anteroapical akinesis - normal EF. 2010  . CKD (chronic kidney disease)   . Coronary atherosclerosis of native coronary artery   . GERD (gastroesophageal reflux disease)   . HTN (hypertension)   . Hyperlipidemia   . Hypothyroidism   . Obesity   . Old myocardial infarct   . Palpitation   . RA (rheumatoid arthritis) ([place])     Past Surgical History:  Procedure Laterality Date  . CHOLECYSTECTOMY    . HEMILAMINOTOMY LUMBAR SPINE      Current Outpatient Prescriptions  Medication Sig Dispense Refill  . allopurinol (ZYLOPRIM) 100 MG tablet Take 100 mg by mouth daily.    Marland Kitchen aspirin 81 MG tablet Take 81 mg by mouth daily.    Marland Kitchen atorvastatin (LIPITOR) 80 MG tablet Take 80 mg by mouth daily.    . calcium & magnesium carbonates (MYLANTA) 311-232 MG per tablet Take 1 tablet by mouth daily.    . carvedilol (COREG) 3.125 MG tablet Take 1 tablet (3.125 mg total) by mouth 2 (two) times daily with a meal. 180 tablet 3  . Cholecalciferol (VITAMIN D) 400 UNITS  capsule Take 400 Units by mouth daily.    . famotidine (PEPCID) 20 MG tablet Take 20 mg by mouth 2 (two) times daily.    . folic acid (FOLVITE) 1 MG tablet Take 2 mg by mouth 2 (two) times daily.    Marland Kitchen levothyroxine (SYNTHROID, LEVOTHROID) 75 MCG tablet Take 75 mcg by mouth daily before breakfast.    . methotrexate (RHEUMATREX) 2.5 MG tablet Take 20 mg by mouth once a week. Caution:Chemotherapy. Protect from light.    . Multiple Vitamin (MULTIVITAMIN) tablet Take 1 tablet by mouth daily.    . Multiple Vitamins-Minerals (PRESERVISION/LUTEIN PO) Take 500 mg by mouth daily.     . Omega-3 Fatty Acids (FISH OIL PO) Take 1,000 capsules by mouth daily.    Marland Kitchen omeprazole (PRILOSEC) 20 MG capsule Take 20 mg by mouth daily.    . predniSONE (DELTASONE) 5 MG tablet Take 5 mg by mouth daily.    . traMADol (ULTRAM) 50 MG tablet Take 50 mg by mouth as needed.     No current facility-administered medications for this visit.     Allergies:   No Known Allergies  Social History:  The patient  reports that he has quit smoking. His smoking use included Cigarettes. He quit after 25.00 years of use. He has never used smokeless tobacco. He reports that he drinks alcohol. He reports that he does not use drugs.   ROS:  Please see the history of present illness.   No syncope, no dizziness, no bleeding, no orthopnea, no PND  unless specified above all other review of systems negative.  PHYSICAL EXAM: VS:  BP 120/68   Pulse 61   Ht 5\' 9"  (1.753 m)   Wt 180 lb 6.4 oz (81.8 kg)   BMI 26.64 kg/m  GEN: Well nourished, well developed, in no acute distress  HEENT: normal  Neck: no JVD, carotid bruits, or masses Cardiac: RRR; no murmurs, rubs, or gallops,no edema  Respiratory:  clear to auscultation bilaterally, normal work of breathing GI: soft, nontender, nondistended, + BS MS: no deformity or atrophy  Skin: warm and dry, no rash Neuro:  Alert and Oriented x 3, Strength and sensation are intact Psych: euthymic mood, full affect  EKG:  EKG today shows sinus rhythm 61, old inferior infarct pattern, no other abnormalities, old anterior infarct pattern. Personally viewed. 07/20/14-sinus bradycardia rate 55, inferior infarct pattern-previously Sinus bradycardia rate 48, inferior infarct pattern, poor R wave progression     ASSESSMENT AND PLAN:   1. Coronary artery disease-status post PCI-currently doing very well, no anginal symptoms. Continue with aggressive secondary prevention. Sometimes mild racing heart with exertion. Rare, 2 times a year.  2. Angina-well controlled. We discussed the  possibility of repeat revascularization in the future if necessary. Hopefully with aggressive secondary prevention this may not be an issue. Stable, no active symptoms. 3. Hyperlipidemia-continue with high-dose statin therapy. No changes made. LDL excellent  63. Prior lab work reviewed. Doing very well. Stable, no myalgias 4. Sinus bradycardia-this has resolved since I decreased his carvedilol to 3.125 g twice a day. Note, he did state that twice a year he is feeling his heart racing, perhaps this will be exacerbated by his decreased beta blocker dose but he does feel better overall, less dizzy. Excellent.. 5. One-year follow-up  Signed, Candee Furbish, MD [facility]  12/12/2016 3:46 PM

## 2016-12-12 NOTE — Patient Instructions (Signed)

## 2016-12-12 NOTE — Progress Notes (Deleted)
[facility]. [address] [place], [name]  [address] Phone: [phone] Fax:  [phone]  Date:  12/12/2016   ID:  Connor Hansen, DOB 02-Jul-1934, MRN [id number]  PCP:  Wenda Low, MD   History of Present Illness: Connor Hansen is a 83 y.o. male who several years ago he had a myocardial infarction treated with thrombolysis. Does not recall a heart catheterization at that time. More recently in 2010 was admitted to the hospital via emergency room with equivocal ST segment changes and chest discomfort. Cardiac catheterization in 2010 at that time demonstrated a high grade mid LAD lesion in addition to a 99% subtotal length the mid right coronary artery lesion. It was difficult to ascertain which one was the culprit and a ventriculogram was done prior to intervention that demonstrated a modest size apical hypokinesis. Accordingly a stent was directed to the mid LAD lesion, bare-metal stent. The right coronary artery was also stented with a bare-metal stent at that time because it was felt that this lesion may occlude in a short period of time. This was done in February of 2010 with procedure performed at [facility].  1992 MI in [place].  Has established care here. Echocardiogram done on Jan 03, 2009 shows mild LVH, normal EF, distal anteroseptal and apical akinesis consistent with myocardial infarction, mild aortic sclerosis. Trivial mitral regurgitation and tricuspid regurgitation.  Overall his LDL has been 78, well controlled on atorvastatin 80. We went over goals. His hypertension is also well controlled. He occasionally will feel palpitations but this is quite rare. He has been having some back pain, this limits his playing of golf.   09/27/15 - No chest pain, no anginal symptoms. Doing well. Macular degeneration. Prior cataract surgery. No symptoms from bradycardia.   Wt Readings from Last 3 Encounters:  09/27/15 186 lb 6.4 oz (84.6 kg)  07/20/14 188 lb (85.3 kg)  07/20/13  196 lb (88.9 kg)     Past Medical History:  Diagnosis Date  . Back pain   . CAD (coronary artery disease)    2/10 - BMS to mid LAD, RCA. Anteroapical akinesis - normal EF. 2010  . CKD (chronic kidney disease)   . Coronary atherosclerosis of native coronary artery   . GERD (gastroesophageal reflux disease)   . HTN (hypertension)   . Hyperlipidemia   . Hypothyroidism   . Obesity   . Old myocardial infarct   . Palpitation   . RA (rheumatoid arthritis) ([place])     Past Surgical History:  Procedure Laterality Date  . CHOLECYSTECTOMY    . HEMILAMINOTOMY LUMBAR SPINE      Current Outpatient Prescriptions  Medication Sig Dispense Refill  . allopurinol (ZYLOPRIM) 100 MG tablet Take 100 mg by mouth daily.    Marland Kitchen aspirin 81 MG tablet Take 81 mg by mouth daily.    Marland Kitchen atorvastatin (LIPITOR) 80 MG tablet Take 80 mg by mouth daily.    . calcium & magnesium carbonates (MYLANTA) 311-232 MG per tablet Take 1 tablet by mouth daily.    . carvedilol (COREG) 3.125 MG tablet Take 1 tablet (3.125 mg total) by mouth 2 (two) times daily with a meal. 180 tablet 3  . Cholecalciferol (VITAMIN D) 400 UNITS capsule Take 400 Units by mouth daily.    . famotidine (PEPCID) 20 MG tablet Take 20 mg by mouth 2 (two) times daily.    . folic acid (FOLVITE) 1 MG tablet Take 2 mg by mouth 2 (two)  times daily.    Marland Kitchen levothyroxine (SYNTHROID, LEVOTHROID) 75 MCG tablet Take 75 mcg by mouth daily before breakfast.    . methotrexate (RHEUMATREX) 2.5 MG tablet Take 20 mg by mouth once a week. Caution:Chemotherapy. Protect from light.    . Multiple Vitamin (MULTIVITAMIN) tablet Take 1 tablet by mouth daily.    . Multiple Vitamins-Minerals (PRESERVISION/LUTEIN PO) Take 500 mg by mouth daily.    . Omega-3 Fatty Acids (FISH OIL PO) Take 1,000 capsules by mouth daily.    Marland Kitchen omeprazole (PRILOSEC) 20 MG capsule Take 20 mg by mouth daily.    . traMADol (ULTRAM) 50 MG tablet Take 50 mg by mouth as needed.     No current  facility-administered medications for this visit.     Allergies:   No Known Allergies  Social History:  The patient  reports that he has quit smoking. His smoking use included Cigarettes. He quit after 25.00 years of use. He does not have any smokeless tobacco history on file. He reports that he drinks alcohol. He reports that he does not use drugs.   ROS:  Please see the history of present illness.   No syncope, no dizziness, no bleeding, no orthopnea, no PND    PHYSICAL EXAM: VS:  There were no vitals taken for this visit. Well nourished, well developed, in no acute distress HEENT: normal Neck: no JVD Cardiac:  normal S1, S2, mildly bradycardic; RRR; no murmur Lungs:  clear to auscultation bilaterally, no wheezing, rhonchi or rales Abd: soft, nontender, no hepatomegaly Ext: no edema Skin: warm and dry Neuro: no focal abnormalities noted  EKG:  07/20/14-sinus bradycardia rate 55, inferior infarct pattern-previously Sinus bradycardia rate 48, inferior infarct pattern, poor R wave progression     ASSESSMENT AND PLAN:   1. Coronary artery disease-status post PCI-currently doing very well, no anginal symptoms. Continue with aggressive secondary prevention. 2. Angina-well controlled. We discussed the possibility of repeat revascularization in the future if necessary. Hopefully with aggressive secondary prevention this may not be an issue.  3. Hyperlipidemia-continue with high-dose statin therapy. No changes made. LDL excellent  63. Prior lab work reviewed. 4. Sinus bradycardia-mild. Overall doing well, no symptoms. We will continue to monitor. He is not having any active symptoms with this. I will however decrease his carvedilol to 3.125 mg twice a day. 5. One-year follow-up  Signed, Candee Furbish, MD [facility]  12/12/2016 6:53 AM

## 2016-12-25 DIAGNOSIS — R972 Elevated prostate specific antigen [PSA]: Secondary | ICD-10-CM | POA: Diagnosis not present

## 2016-12-25 DIAGNOSIS — N4231 Prostatic intraepithelial neoplasia: Secondary | ICD-10-CM | POA: Diagnosis not present

## 2017-01-12 ENCOUNTER — Other Ambulatory Visit: Payer: Self-pay | Admitting: Cardiology

## 2017-01-15 DIAGNOSIS — M0609 Rheumatoid arthritis without rheumatoid factor, multiple sites: Secondary | ICD-10-CM | POA: Diagnosis not present

## 2017-01-15 DIAGNOSIS — M2550 Pain in unspecified joint: Secondary | ICD-10-CM | POA: Diagnosis not present

## 2017-01-15 DIAGNOSIS — Z79899 Other long term (current) drug therapy: Secondary | ICD-10-CM | POA: Diagnosis not present

## 2017-01-15 DIAGNOSIS — M1009 Idiopathic gout, multiple sites: Secondary | ICD-10-CM | POA: Diagnosis not present

## 2017-01-15 DIAGNOSIS — M159 Polyosteoarthritis, unspecified: Secondary | ICD-10-CM | POA: Diagnosis not present

## 2017-01-15 DIAGNOSIS — Z6827 Body mass index (BMI) 27.0-27.9, adult: Secondary | ICD-10-CM | POA: Diagnosis not present

## 2017-01-15 DIAGNOSIS — E663 Overweight: Secondary | ICD-10-CM | POA: Diagnosis not present

## 2017-02-25 DIAGNOSIS — R972 Elevated prostate specific antigen [PSA]: Secondary | ICD-10-CM | POA: Diagnosis not present

## 2017-03-10 ENCOUNTER — Other Ambulatory Visit: Payer: Self-pay | Admitting: Internal Medicine

## 2017-03-10 ENCOUNTER — Ambulatory Visit
Admission: RE | Admit: 2017-03-10 | Discharge: 2017-03-10 | Disposition: A | Discharge status: Home / Self Care | Payer: PPO | Source: Ambulatory Visit | Attending: Internal Medicine | Admitting: Internal Medicine

## 2017-03-10 DIAGNOSIS — M549 Dorsalgia, unspecified: Secondary | ICD-10-CM | POA: Diagnosis not present

## 2017-03-10 DIAGNOSIS — H353232 Exudative age-related macular degeneration, bilateral, with inactive choroidal neovascularization: Secondary | ICD-10-CM | POA: Diagnosis not present

## 2017-03-10 DIAGNOSIS — Z961 Presence of intraocular lens: Secondary | ICD-10-CM | POA: Diagnosis not present

## 2017-03-12 DIAGNOSIS — H33193 Other retinoschisis and retinal cysts, bilateral: Secondary | ICD-10-CM | POA: Diagnosis not present

## 2017-03-12 DIAGNOSIS — H35423 Microcystoid degeneration of retina, bilateral: Secondary | ICD-10-CM | POA: Diagnosis not present

## 2017-03-12 DIAGNOSIS — H353231 Exudative age-related macular degeneration, bilateral, with active choroidal neovascularization: Secondary | ICD-10-CM | POA: Diagnosis not present

## 2017-03-12 DIAGNOSIS — H35433 Paving stone degeneration of retina, bilateral: Secondary | ICD-10-CM | POA: Diagnosis not present

## 2017-03-13 DIAGNOSIS — H353231 Exudative age-related macular degeneration, bilateral, with active choroidal neovascularization: Secondary | ICD-10-CM | POA: Diagnosis not present

## 2017-04-16 DIAGNOSIS — M1009 Idiopathic gout, multiple sites: Secondary | ICD-10-CM | POA: Diagnosis not present

## 2017-04-16 DIAGNOSIS — Z79899 Other long term (current) drug therapy: Secondary | ICD-10-CM | POA: Diagnosis not present

## 2017-04-16 DIAGNOSIS — M0609 Rheumatoid arthritis without rheumatoid factor, multiple sites: Secondary | ICD-10-CM | POA: Diagnosis not present

## 2017-04-16 DIAGNOSIS — M2550 Pain in unspecified joint: Secondary | ICD-10-CM | POA: Diagnosis not present

## 2017-04-16 DIAGNOSIS — Z6828 Body mass index (BMI) 28.0-28.9, adult: Secondary | ICD-10-CM | POA: Diagnosis not present

## 2017-04-16 DIAGNOSIS — M159 Polyosteoarthritis, unspecified: Secondary | ICD-10-CM | POA: Diagnosis not present

## 2017-04-16 DIAGNOSIS — M546 Pain in thoracic spine: Secondary | ICD-10-CM | POA: Diagnosis not present

## 2017-04-23 DIAGNOSIS — H353231 Exudative age-related macular degeneration, bilateral, with active choroidal neovascularization: Secondary | ICD-10-CM | POA: Diagnosis not present

## 2017-04-23 DIAGNOSIS — H35423 Microcystoid degeneration of retina, bilateral: Secondary | ICD-10-CM | POA: Diagnosis not present

## 2017-04-23 DIAGNOSIS — H35433 Paving stone degeneration of retina, bilateral: Secondary | ICD-10-CM | POA: Diagnosis not present

## 2017-04-23 DIAGNOSIS — H43813 Vitreous degeneration, bilateral: Secondary | ICD-10-CM | POA: Diagnosis not present

## 2017-05-14 DIAGNOSIS — Z23 Encounter for immunization: Secondary | ICD-10-CM | POA: Diagnosis not present

## 2017-05-30 ENCOUNTER — Encounter (HOSPITAL_BASED_OUTPATIENT_CLINIC_OR_DEPARTMENT_OTHER): Payer: Self-pay | Admitting: *Deleted

## 2017-06-02 ENCOUNTER — Encounter (HOSPITAL_BASED_OUTPATIENT_CLINIC_OR_DEPARTMENT_OTHER): Payer: Self-pay | Admitting: Anesthesiology

## 2017-06-04 DIAGNOSIS — H35433 Paving stone degeneration of retina, bilateral: Secondary | ICD-10-CM | POA: Diagnosis not present

## 2017-06-04 DIAGNOSIS — H33193 Other retinoschisis and retinal cysts, bilateral: Secondary | ICD-10-CM | POA: Diagnosis not present

## 2017-06-04 DIAGNOSIS — H35423 Microcystoid degeneration of retina, bilateral: Secondary | ICD-10-CM | POA: Diagnosis not present

## 2017-06-04 DIAGNOSIS — H353231 Exudative age-related macular degeneration, bilateral, with active choroidal neovascularization: Secondary | ICD-10-CM | POA: Diagnosis not present

## 2017-06-06 ENCOUNTER — Encounter (HOSPITAL_BASED_OUTPATIENT_CLINIC_OR_DEPARTMENT_OTHER): Admission: RE | Payer: Self-pay | Source: Ambulatory Visit

## 2017-06-06 ENCOUNTER — Ambulatory Visit (HOSPITAL_BASED_OUTPATIENT_CLINIC_OR_DEPARTMENT_OTHER): Admission: RE | Admit: 2017-06-06 | Payer: PPO | Source: Ambulatory Visit | Admitting: Ophthalmology

## 2017-06-06 SURGERY — Surgical Case
Anesthesia: *Unknown

## 2017-06-06 SURGERY — STRABISMUS SURGERY, BILATERAL
Anesthesia: General | Laterality: Bilateral

## 2017-07-16 DIAGNOSIS — H35423 Microcystoid degeneration of retina, bilateral: Secondary | ICD-10-CM | POA: Diagnosis not present

## 2017-07-16 DIAGNOSIS — H33193 Other retinoschisis and retinal cysts, bilateral: Secondary | ICD-10-CM | POA: Diagnosis not present

## 2017-07-16 DIAGNOSIS — M0609 Rheumatoid arthritis without rheumatoid factor, multiple sites: Secondary | ICD-10-CM | POA: Diagnosis not present

## 2017-07-16 DIAGNOSIS — H35433 Paving stone degeneration of retina, bilateral: Secondary | ICD-10-CM | POA: Diagnosis not present

## 2017-07-16 DIAGNOSIS — H353231 Exudative age-related macular degeneration, bilateral, with active choroidal neovascularization: Secondary | ICD-10-CM | POA: Diagnosis not present

## 2017-08-27 DIAGNOSIS — R739 Hyperglycemia, unspecified: Secondary | ICD-10-CM | POA: Diagnosis not present

## 2017-08-27 DIAGNOSIS — I10 Essential (primary) hypertension: Secondary | ICD-10-CM | POA: Diagnosis not present

## 2017-08-27 DIAGNOSIS — E039 Hypothyroidism, unspecified: Secondary | ICD-10-CM | POA: Diagnosis not present

## 2017-08-27 DIAGNOSIS — M1990 Unspecified osteoarthritis, unspecified site: Secondary | ICD-10-CM | POA: Diagnosis not present

## 2017-08-27 DIAGNOSIS — I252 Old myocardial infarction: Secondary | ICD-10-CM | POA: Diagnosis not present

## 2017-08-27 DIAGNOSIS — M109 Gout, unspecified: Secondary | ICD-10-CM | POA: Diagnosis not present

## 2017-08-27 DIAGNOSIS — Z0000 Encounter for general adult medical examination without abnormal findings: Secondary | ICD-10-CM | POA: Diagnosis not present

## 2017-08-27 DIAGNOSIS — R972 Elevated prostate specific antigen [PSA]: Secondary | ICD-10-CM | POA: Diagnosis not present

## 2017-08-27 DIAGNOSIS — K219 Gastro-esophageal reflux disease without esophagitis: Secondary | ICD-10-CM | POA: Diagnosis not present

## 2017-08-27 DIAGNOSIS — Z1389 Encounter for screening for other disorder: Secondary | ICD-10-CM | POA: Diagnosis not present

## 2017-08-27 DIAGNOSIS — I2510 Atherosclerotic heart disease of native coronary artery without angina pectoris: Secondary | ICD-10-CM | POA: Diagnosis not present

## 2017-08-27 DIAGNOSIS — M069 Rheumatoid arthritis, unspecified: Secondary | ICD-10-CM | POA: Diagnosis not present

## 2017-09-12 DIAGNOSIS — H33193 Other retinoschisis and retinal cysts, bilateral: Secondary | ICD-10-CM | POA: Diagnosis not present

## 2017-09-12 DIAGNOSIS — H353231 Exudative age-related macular degeneration, bilateral, with active choroidal neovascularization: Secondary | ICD-10-CM | POA: Diagnosis not present

## 2017-09-12 DIAGNOSIS — H35423 Microcystoid degeneration of retina, bilateral: Secondary | ICD-10-CM | POA: Diagnosis not present

## 2017-09-12 DIAGNOSIS — H43813 Vitreous degeneration, bilateral: Secondary | ICD-10-CM | POA: Diagnosis not present

## 2017-09-24 DIAGNOSIS — R972 Elevated prostate specific antigen [PSA]: Secondary | ICD-10-CM | POA: Diagnosis not present

## 2017-09-24 DIAGNOSIS — N401 Enlarged prostate with lower urinary tract symptoms: Secondary | ICD-10-CM | POA: Diagnosis not present

## 2017-09-24 DIAGNOSIS — R351 Nocturia: Secondary | ICD-10-CM | POA: Diagnosis not present

## 2017-10-08 DIAGNOSIS — M545 Low back pain: Secondary | ICD-10-CM | POA: Diagnosis not present

## 2017-10-08 DIAGNOSIS — M47816 Spondylosis without myelopathy or radiculopathy, lumbar region: Secondary | ICD-10-CM | POA: Diagnosis not present

## 2017-10-08 DIAGNOSIS — R030 Elevated blood-pressure reading, without diagnosis of hypertension: Secondary | ICD-10-CM | POA: Diagnosis not present

## 2017-10-08 DIAGNOSIS — Z6829 Body mass index (BMI) 29.0-29.9, adult: Secondary | ICD-10-CM | POA: Diagnosis not present

## 2017-10-15 DIAGNOSIS — M2550 Pain in unspecified joint: Secondary | ICD-10-CM | POA: Diagnosis not present

## 2017-10-15 DIAGNOSIS — M1009 Idiopathic gout, multiple sites: Secondary | ICD-10-CM | POA: Diagnosis not present

## 2017-10-15 DIAGNOSIS — M0609 Rheumatoid arthritis without rheumatoid factor, multiple sites: Secondary | ICD-10-CM | POA: Diagnosis not present

## 2017-10-15 DIAGNOSIS — E663 Overweight: Secondary | ICD-10-CM | POA: Diagnosis not present

## 2017-10-15 DIAGNOSIS — Z6829 Body mass index (BMI) 29.0-29.9, adult: Secondary | ICD-10-CM | POA: Diagnosis not present

## 2017-10-15 DIAGNOSIS — M159 Polyosteoarthritis, unspecified: Secondary | ICD-10-CM | POA: Diagnosis not present

## 2017-10-15 DIAGNOSIS — Z79899 Other long term (current) drug therapy: Secondary | ICD-10-CM | POA: Diagnosis not present

## 2017-11-12 ENCOUNTER — Ambulatory Visit: Payer: PPO | Attending: Neurosurgery | Admitting: Physical Therapy

## 2017-11-12 ENCOUNTER — Encounter: Payer: Self-pay | Admitting: Physical Therapy

## 2017-11-12 ENCOUNTER — Other Ambulatory Visit: Payer: Self-pay

## 2017-11-12 DIAGNOSIS — G8929 Other chronic pain: Secondary | ICD-10-CM

## 2017-11-12 DIAGNOSIS — M25551 Pain in right hip: Secondary | ICD-10-CM | POA: Diagnosis not present

## 2017-11-12 DIAGNOSIS — M5441 Lumbago with sciatica, right side: Secondary | ICD-10-CM | POA: Diagnosis not present

## 2017-11-12 DIAGNOSIS — R29898 Other symptoms and signs involving the musculoskeletal system: Secondary | ICD-10-CM

## 2017-11-12 NOTE — Therapy (Signed)
[facility] [address]  [place], [place], [address] Phone: [phone]   Fax:  [phone]  Physical Therapy Evaluation  Patient Details  Name: Connor Hansen MRN: [id number] Date of Birth: 1934/01/14 Referring Provider: Dr. Erline Levine   Encounter Date: 11/12/2017  PT End of Session - 11/12/17 1150    Visit Number  1    Number of Visits  8    Date for PT Re-Evaluation  01/07/18    Authorization Type  [employer]    PT Start Time  1053    PT Stop Time  1137    PT Time Calculation (min)  44 min    Activity Tolerance  Patient tolerated treatment well    Behavior During Therapy  [facility] for tasks assessed/performed       Past Medical History:  Diagnosis Date  . CAD (coronary artery disease)    2/10 - BMS to mid LAD, RCA. Anteroapical akinesis - normal EF. 2010  . CKD (chronic kidney disease)   . Coronary atherosclerosis of native coronary artery   . GERD (gastroesophageal reflux disease)   . HTN (hypertension)   . Hyperlipidemia   . Hypothyroidism   . Old myocardial infarct   . RA (rheumatoid arthritis) ([place])     Past Surgical History:  Procedure Laterality Date  . CHOLECYSTECTOMY    . HEMILAMINOTOMY LUMBAR SPINE      There were no vitals filed for this visit.   Subjective Assessment - 11/12/17 1051    Subjective  had PT in 2014 - for low back; reports pain at sciatic nerve - R side - as well as "muscle pull" above hip. Muscle pain hurts all the time. Sciatic pain intermittent throughout day. Has had pain ~5 years - better after first bout of PT, now getting worse. Denies N&T into leg; only pain into lateral thigh - R side only. Feels restricted with general motion and ADLs. Heat tends to make pain better  as well as Tramadol - prn.     Pertinent History  CAD, CKD, HTN, RA, h/o MI    Diagnostic tests  nothing recent    Currently in Pain?  Yes    Pain Score  4     Pain Location  Back    Pain  Orientation  Right;Lower    Pain Descriptors / Indicators  Aching sharp pain with movement    Pain Type  Chronic pain    Pain Onset  More than a month ago    Pain Frequency  Intermittent    Aggravating Factors   movement    Pain Relieving Factors  heat         [facility] PT Assessment - 11/12/17 1050      Assessment   Medical Diagnosis  Low back pain    Referring Provider  Dr. Erline Levine    Next MD Visit  -- after PT - prn    Prior Therapy  yes - 5 years ago      Precautions   Precautions  None      Restrictions   Weight Bearing Restrictions  No      Balance Screen   Has the patient fallen in the past 6 months  Yes    How many times?  1    Has the patient had a decrease in activity level because of a fear of falling?   No    Is the patient reluctant to leave  their home because of a fear of falling?   No      Home Film/video editor residence    Living Arrangements  Spouse/significant other      Prior Function   Level of [place]  Retired    Office manager, outdoor work      Charity fundraiser Status  Within [employer] for tasks assessed      Observation/Other Assessments   Focus on Therapeutic Outcomes (FOTO)   Lumbar Spine:       Sensation   Light Touch  Appears Intact      Coordination   Gross Motor Movements are Fluid and Coordinated  Yes      Posture/Postural Control   Posture/Postural Control  Postural limitations    Postural Limitations  Rounded Shoulders;Forward head      ROM / Strength   AROM / PROM / Strength  AROM;Strength      AROM   AROM Assessment Site  Lumbar    Lumbar Flexion  fingertip to mid shins - stretching in leg    Lumbar Extension  75% limited - pain at R low back    Lumbar - Right Side Bend  25% limited - hurts a little less on R side    Lumbar - Left Side Bend  25% limited - R sided pain    Lumbar - Right Rotation  50% limited - hurts less    Lumbar - Left  Rotation  WNL - painful      Strength   Strength Assessment Site  Hip;Knee    Right/Left Hip  Right;Left    Right Hip Flexion  4/5    Right Hip ABduction  4/5    Left Hip Flexion  4/5    Right/Left Knee  Right;Left    Right Knee Flexion  4+/5 some pain    Right Knee Extension  4+/5    Left Knee Flexion  4+/5    Left Knee Extension  4+/5      Flexibility   Soft Tissue Assessment /Muscle Length  yes    Hamstrings  B tightness    Piriformis  R tightness      Palpation   Palpation comment  TTP at R buttock (piriformis, glute max and med) as well as R sided thoracic to lumbar paraspinals                Objective measurements completed on examination: See above findings.      [facility] Adult PT Treatment/Exercise - 11/12/17 1050      Exercises   Exercises  Lumbar      Lumbar Exercises: Stretches   Passive Hamstring Stretch  Right;Left;3 reps;30 seconds    Single Knee to Chest Stretch  Right;1 rep;30 seconds    Lower Trunk Rotation  5 reps;10 seconds    Lower Trunk Rotation Limitations  PT led    Piriformis Stretch  Right;3 reps;30 seconds    Piriformis Stretch Limitations  KTOS      Lumbar Exercises: Supine   Bridge  5 reps;3 seconds             PT Education - 11/12/17 1150    Education provided  Yes    Education Details  exam findings, POC, HEP    Person(s) Educated  Patient    Methods  Explanation;Demonstration;Handout    Comprehension  Verbalized understanding;Returned demonstration;Need further instruction  PT Long Term Goals - 11/12/17 1157      PT LONG TERM GOAL #1   Title  patient to be independent with advanced HEP    Status  New    Target Date  01/07/18      PT LONG TERM GOAL #2   Title  patient to improve lumbar AROM to WNL in all planes without pain limiting motion    Status  New    Target Date  01/07/18      PT LONG TERM GOAL #3   Title  patient to demonstrate improved flexibility at R hip/buttock for improved pain     Status  New    Target Date  01/07/18      PT LONG TERM GOAL #4   Title  patient to improve B hip strength to >/= 4+/5 without pain    Status  New    Target Date  01/07/18             Plan - 11/12/17 1152    Clinical Impression Statement  Connor Hansen is a pleasant 84 y/o male presenting to [facility] today regarding primary complaints of R sided low back pain with pain radition to R lateral/anterior thigh. Patient today with limited AROM at lumbar spine with pain reproduction during extension, rotary and lateral movements, as well as slight weakness at proximal hip mm, and TTP throughout R buttock and low back musculature. Patient today given initial HEP for gentle stretching and strengthening to affected area with good tolerance and carryover. Patient to benefit from skilled PT intervention to address the above listed deficits to allow for improved functional mobility and QOL.     Clinical Presentation  Stable    Clinical Decision Making  Low    Rehab Potential  Good    PT Frequency  1x / week    PT Duration  8 weeks    PT Treatment/Interventions  ADLs/Self Care Home Management;Cryotherapy;Electrical Stimulation;Moist Heat;Traction;Therapeutic exercise;Therapeutic activities;Functional mobility training;Ultrasound;Neuromuscular re-education;Patient/family education;Manual techniques;Vasopneumatic Device;Taping;Dry needling;Passive range of motion    Consulted and Agree with Plan of Care  Patient       Patient will benefit from skilled therapeutic intervention in order to improve the following deficits and impairments:  Pain, Decreased range of motion, Decreased mobility, Decreased strength, Decreased activity tolerance  Visit Diagnosis: Chronic right-sided low back pain with right-sided sciatica  Pain in right hip  Other symptoms and signs involving the musculoskeletal system     Problem List Patient Active Problem List   Diagnosis Date Noted  . Coronary artery disease due to lipid  rich plaque 07/20/2014  . Hyperlipidemia 07/20/2013  . HTN (hypertension) 07/20/2013  . Angina decubitus ([place]) 07/20/2013  . Old MI (myocardial infarction) 07/20/2013     Lanney Gins, PT, DPT 11/12/17 12:01 PM   [facility] [address]  [place], [place], [address] Phone: [phone]   Fax:  [phone]  Name: Connor Hansen MRN: [id number] Date of Birth: 05/24/1934

## 2017-11-12 NOTE — Patient Instructions (Signed)
Hamstring Step 2   Left foot relaxed, knee straight, other leg bent, foot flat. Raise straight leg further upward to maximal range. Hold _30__ seconds. Relax leg completely down. Repeat _3__ times.  Piriformis Stretch   Lying on back, pull right knee toward opposite shoulder. Hold __30__ seconds. Repeat __3__ times.   Lumbar Rotation (Non-Weight Bearing)   Feet on floor, slowly rock knees from side to side in small, pain-free range of motion. Allow lower back to rotate slightly. Repeat _10-15___ times per set.  Do __2__ sessions per day.  Bridging   Slowly raise buttocks from floor, keeping stomach tight. Repeat __10-15__ times per set. Do __2__ sets per session.   Knee to Chest (Flexion)   Pull knee toward chest. Feel stretch in lower back or buttock area. Breathing deeply, Hold __15-30__ seconds. Repeat with other knee. Repeat _3-5___ times.

## 2017-11-19 ENCOUNTER — Ambulatory Visit: Payer: PPO

## 2017-11-19 DIAGNOSIS — M5441 Lumbago with sciatica, right side: Principal | ICD-10-CM

## 2017-11-19 DIAGNOSIS — M25551 Pain in right hip: Secondary | ICD-10-CM

## 2017-11-19 DIAGNOSIS — R29898 Other symptoms and signs involving the musculoskeletal system: Secondary | ICD-10-CM

## 2017-11-19 DIAGNOSIS — G8929 Other chronic pain: Secondary | ICD-10-CM

## 2017-11-19 NOTE — Therapy (Signed)
[facility] [address]  [place], [place], [address] Phone: [phone]   Fax:  [phone]  Physical Therapy Treatment  Patient Details  Name: Connor Hansen MRN: [id number] Date of Birth: 03-Dec-1933 Referring Provider: Dr. Erline Levine   Encounter Date: 11/19/2017  PT End of Session - 11/19/17 1500    Visit Number  2    Number of Visits  8    Date for PT Re-Evaluation  01/07/18    Authorization Type  [employer]    PT Start Time  1447    PT Stop Time  1542    PT Time Calculation (min)  55 min    Activity Tolerance  Patient tolerated treatment well    Behavior During Therapy  [facility] for tasks assessed/performed       Past Medical History:  Diagnosis Date  . CAD (coronary artery disease)    2/10 - BMS to mid LAD, RCA. Anteroapical akinesis - normal EF. 2010  . CKD (chronic kidney disease)   . Coronary atherosclerosis of native coronary artery   . GERD (gastroesophageal reflux disease)   . HTN (hypertension)   . Hyperlipidemia   . Hypothyroidism   . Old myocardial infarct   . RA (rheumatoid arthritis) ([place])     Past Surgical History:  Procedure Laterality Date  . CHOLECYSTECTOMY    . HEMILAMINOTOMY LUMBAR SPINE      There were no vitals filed for this visit.  Subjective Assessment - 11/19/17 1451    Subjective  Pt. reporting improvement in back pain since performing HEP however feels "muscle on left side of my back started hurting following the exercises".      Pertinent History  CAD, CKD, HTN, RA, h/o MI    Diagnostic tests  nothing recent    Currently in Pain?  Yes    Pain Score  2     Pain Location  Back    Pain Orientation  Mid;Lower;Left    Pain Descriptors / Indicators  Sharp    Pain Type  Chronic pain    Pain Onset  More than a month ago    Pain Frequency  Intermittent    Aggravating Factors   rotation     Multiple Pain Sites  No                       [facility] Adult PT  Treatment/Exercise - 11/19/17 1458      Lumbar Exercises: Stretches   Passive Hamstring Stretch  Right;Left;30 seconds;2 reps    Passive Hamstring Stretch Limitations  supine with strap    Single Knee to Chest Stretch  Right;1 rep;30 seconds    Lower Trunk Rotation  5 reps;10 seconds    Lower Trunk Rotation Limitations  Cues to avoid painful range     Piriformis Stretch  Right;30 seconds;2 reps    Piriformis Stretch Limitations  KTOS      Lumbar Exercises: Aerobic   Nustep  -- No warmup due to availability of machines       Lumbar Exercises: Supine   Clam  10 reps;3 seconds    Clam Limitations  Alternating hip abd/ER with red TB at knees     Bridge  10 reps;3 seconds Cues to avoid excessive hold time and hyperextension       Lumbar Exercises: Sidelying   Clam  Right;10 reps;3 seconds cues required to avoid trunk rotation     Clam Limitations  yellow band      Modalities   Modalities  Electrical Stimulation;Moist Heat      Moist Heat Therapy   Number Minutes Moist Heat  10 Minutes    Moist Heat Location  Lumbar Spine and mid back       Electrical Stimulation   Electrical Stimulation Location  Lumbar spine     Electrical Stimulation Action  IFC    Electrical Stimulation Parameters  80-150Hz , intensity to pt. tolerance, 10'    Electrical Stimulation Goals  Pain;Tone      Manual Therapy   Manual Therapy  Soft tissue mobilization    Manual therapy comments  L sidelying with R LE resting on bolster     Soft tissue mobilization  STM to R sided paraspinals and R mid thoracic in areas of tenderness; good relief following this                   PT Long Term Goals - 11/19/17 1501      PT LONG TERM GOAL #1   Title  patient to be independent with advanced HEP    Status  On-going      PT LONG TERM GOAL #2   Title  patient to improve lumbar AROM to WNL in all planes without pain limiting motion    Status  On-going      PT LONG TERM GOAL #3   Title  patient to  demonstrate improved flexibility at R hip/buttock for improved pain    Status  On-going      PT LONG TERM GOAL #4   Title  patient to improve B hip strength to >/= 4+/5 without pain    Status  On-going            Plan - 11/19/17 1501    Clinical Impression Statement  Connor Hansen requesting to review HEP as he feels "one of the exercises made a muscle on the left side of my back start hurting".  Does note ~ 50% reduction in R hip pain since performing home program.  HEP review revealed Connor Hansen pushing LTR into painful range with pain felt in area of complaint.  Pt. technique corrected with pt. able to perform activity pain free following.  Found some relief from STM to back musculature today and may benefit from further manual work in this area.  Ended treatment with E-stim/moist heat to lumbar spine to further promote relaxation of musculature and reduction in tone.      PT Treatment/Interventions  ADLs/Self Care Home Management;Cryotherapy;Electrical Stimulation;Moist Heat;Traction;Therapeutic exercise;Therapeutic activities;Functional mobility training;Ultrasound;Neuromuscular re-education;Patient/family education;Manual techniques;Vasopneumatic Device;Taping;Dry needling;Passive range of motion    Consulted and Agree with Plan of Care  Patient       Patient will benefit from skilled therapeutic intervention in order to improve the following deficits and impairments:  Pain, Decreased range of motion, Decreased mobility, Decreased strength, Decreased activity tolerance  Visit Diagnosis: Chronic right-sided low back pain with right-sided sciatica  Pain in right hip  Other symptoms and signs involving the musculoskeletal system     Problem List Patient Active Problem List   Diagnosis Date Noted  . Coronary artery disease due to lipid rich plaque 07/20/2014  . Hyperlipidemia 07/20/2013  . HTN (hypertension) 07/20/2013  . Angina decubitus ([place]) 07/20/2013  . Old MI (myocardial  infarction) 07/20/2013    Bess Harvest, PTA 11/19/17 6:12 PM  [facility] [address]  [place], [place], [address] Phone: [phone]  Fax:  [phone]  Name: Connor Hansen MRN: [id number] Date of Birth: May 25, 1934

## 2017-11-25 DIAGNOSIS — H35433 Paving stone degeneration of retina, bilateral: Secondary | ICD-10-CM | POA: Diagnosis not present

## 2017-11-25 DIAGNOSIS — H33193 Other retinoschisis and retinal cysts, bilateral: Secondary | ICD-10-CM | POA: Diagnosis not present

## 2017-11-25 DIAGNOSIS — H353231 Exudative age-related macular degeneration, bilateral, with active choroidal neovascularization: Secondary | ICD-10-CM | POA: Diagnosis not present

## 2017-11-25 DIAGNOSIS — H35423 Microcystoid degeneration of retina, bilateral: Secondary | ICD-10-CM | POA: Diagnosis not present

## 2017-11-26 ENCOUNTER — Ambulatory Visit: Payer: PPO | Admitting: Physical Therapy

## 2017-11-26 ENCOUNTER — Encounter: Payer: Self-pay | Admitting: Physical Therapy

## 2017-11-26 DIAGNOSIS — R29898 Other symptoms and signs involving the musculoskeletal system: Secondary | ICD-10-CM

## 2017-11-26 DIAGNOSIS — M5441 Lumbago with sciatica, right side: Secondary | ICD-10-CM | POA: Diagnosis not present

## 2017-11-26 DIAGNOSIS — G8929 Other chronic pain: Secondary | ICD-10-CM

## 2017-11-26 DIAGNOSIS — M25551 Pain in right hip: Secondary | ICD-10-CM

## 2017-11-26 NOTE — Patient Instructions (Signed)
Open Book  With hips and knees bent and arms out straight, take top arm and rotate with head/eyes following Hold 10 sec x 5 on each side

## 2017-11-26 NOTE — Therapy (Signed)
[facility] [address]  [place], [place], [address] Phone: [phone]   Fax:  [phone]  Physical Therapy Treatment  Patient Details  Name: Connor Hansen MRN: [id number] Date of Birth: 1934/01/22 Referring Provider: Dr. Erline Levine   Encounter Date: 11/26/2017  PT End of Session - 11/26/17 1432    Visit Number  3    Number of Visits  8    Date for PT Re-Evaluation  01/07/18    Authorization Type  [employer]    PT Start Time  1428    PT Stop Time  1531    PT Time Calculation (min)  63 min    Activity Tolerance  Patient tolerated treatment well    Behavior During Therapy  [facility] for tasks assessed/performed       Past Medical History:  Diagnosis Date  . CAD (coronary artery disease)    2/10 - BMS to mid LAD, RCA. Anteroapical akinesis - normal EF. 2010  . CKD (chronic kidney disease)   . Coronary atherosclerosis of native coronary artery   . GERD (gastroesophageal reflux disease)   . HTN (hypertension)   . Hyperlipidemia   . Hypothyroidism   . Old myocardial infarct   . RA (rheumatoid arthritis) ([place])     Past Surgical History:  Procedure Laterality Date  . CHOLECYSTECTOMY    . HEMILAMINOTOMY LUMBAR SPINE      There were no vitals filed for this visit.  Subjective Assessment - 11/26/17 1430    Subjective  doing well - feels like sciatic symptoms are improved, but muscle above hip/back are still irritated    Pertinent History  CAD, CKD, HTN, RA, h/o MI    Diagnostic tests  nothing recent    Patient Stated Goals  improve pain    Currently in Pain?  Yes    Pain Score  4     Pain Location  Back    Pain Orientation  Right;Lower    Pain Descriptors / Indicators  Aching;Discomfort    Pain Type  Chronic pain                       [facility] Adult PT Treatment/Exercise - 11/26/17 1433      Lumbar Exercises: Stretches   Other Lumbar Stretch Exercise  sciatic nerve glide x 10 reps       Lumbar Exercises: Aerobic   Recumbent Bike  L2 x 6 min      Lumbar Exercises: Supine   Isometric Hip Flexion  10 reps;5 seconds B feet resting on peanut ball    Other Supine Lumbar Exercises  HS bridge x 12 reps      Lumbar Exercises: Sidelying   Other Sidelying Lumbar Exercises  open book - 5 x 10 sec each side      Lumbar Exercises: Quadruped   [place]  10 reps    Other Quadruped Lumbar Exercises  childs pose + overpressure by PT on thoracic and lumbar segments for improved joint mobility      Modalities   Modalities  Electrical Stimulation      Electrical Stimulation   Electrical Stimulation Location  R sided lumbar    Electrical Stimulation Action  IFC    Electrical Stimulation Parameters  to tolerance    Electrical Stimulation Goals  Pain;Tone      Manual Therapy   Manual Therapy  Joint mobilization;Soft tissue mobilization;Taping    Manual therapy comments  patient prone    Joint Mobilization  gentle CPAs of lumbar and lower thoracic spine    Soft tissue mobilization  STM to R sided paraspinals, lower lumbar musculature    Kinesiotex  Inhibit Muscle      Kinesiotix   Inhibit Muscle   2 "I" strips along paraspinals, 1 horizontal strip                  PT Long Term Goals - 11/19/17 1501      PT LONG TERM GOAL #1   Title  patient to be independent with advanced HEP    Status  On-going      PT LONG TERM GOAL #2   Title  patient to improve lumbar AROM to WNL in all planes without pain limiting motion    Status  On-going      PT LONG TERM GOAL #3   Title  patient to demonstrate improved flexibility at R hip/buttock for improved pain    Status  On-going      PT LONG TERM GOAL #4   Title  patient to improve B hip strength to >/= 4+/5 without pain    Status  On-going            Plan - 11/26/17 1432    Clinical Impression Statement  Patient doing well - discussed lower lumbar rotation exercise and to refrain form this for a few days to  see if this is provoking pain. Patient tolerable to all the ex in session today without increase in pain. SOme cueing required in seesion to produce appropriate muscle activation and motor control. Taping to low back for hopeful pain reduction.     PT Treatment/Interventions  ADLs/Self Care Home Management;Cryotherapy;Electrical Stimulation;Moist Heat;Traction;Therapeutic exercise;Therapeutic activities;Functional mobility training;Ultrasound;Neuromuscular re-education;Patient/family education;Manual techniques;Vasopneumatic Device;Taping;Dry needling;Passive range of motion    Consulted and Agree with Plan of Care  Patient       Patient will benefit from skilled therapeutic intervention in order to improve the following deficits and impairments:  Pain, Decreased range of motion, Decreased mobility, Decreased strength, Decreased activity tolerance  Visit Diagnosis: Chronic right-sided low back pain with right-sided sciatica  Pain in right hip  Other symptoms and signs involving the musculoskeletal system     Problem List Patient Active Problem List   Diagnosis Date Noted  . Coronary artery disease due to lipid rich plaque 07/20/2014  . Hyperlipidemia 07/20/2013  . HTN (hypertension) 07/20/2013  . Angina decubitus ([place]) 07/20/2013  . Old MI (myocardial infarction) 07/20/2013     Lanney Gins, PT, DPT 11/26/17 3:35 PM   [facility] [address]  [place], [place], [address] Phone: [phone]   Fax:  [phone]  Name: Connor Hansen MRN: [id number] Date of Birth: May 02, 1934

## 2017-12-03 ENCOUNTER — Ambulatory Visit: Payer: PPO

## 2017-12-03 DIAGNOSIS — R29898 Other symptoms and signs involving the musculoskeletal system: Secondary | ICD-10-CM

## 2017-12-03 DIAGNOSIS — M25551 Pain in right hip: Secondary | ICD-10-CM

## 2017-12-03 DIAGNOSIS — M5441 Lumbago with sciatica, right side: Secondary | ICD-10-CM | POA: Diagnosis not present

## 2017-12-03 DIAGNOSIS — G8929 Other chronic pain: Secondary | ICD-10-CM

## 2017-12-03 NOTE — Therapy (Signed)
[facility] [address]  [place], [place], [address] Phone: [phone]   Fax:  [phone]  Physical Therapy Treatment  Patient Details  Name: Connor Hansen MRN: [id number] Date of Birth: 28-Mar-1934 Referring Provider: Dr. Erline Levine    Encounter Date: 12/03/2017  PT End of Session - 12/03/17 1449    Visit Number  4    Number of Visits  8    Date for PT Re-Evaluation  01/07/18    Authorization Type  [employer]    PT Start Time  1440    PT Stop Time  1540    PT Time Calculation (min)  60 min    Activity Tolerance  Patient tolerated treatment well    Behavior During Therapy  [facility] for tasks assessed/performed       Past Medical History:  Diagnosis Date  . CAD (coronary artery disease)    2/10 - BMS to mid LAD, RCA. Anteroapical akinesis - normal EF. 2010  . CKD (chronic kidney disease)   . Coronary atherosclerosis of native coronary artery   . GERD (gastroesophageal reflux disease)   . HTN (hypertension)   . Hyperlipidemia   . Hypothyroidism   . Old myocardial infarct   . RA (rheumatoid arthritis) ([place])     Past Surgical History:  Procedure Laterality Date  . CHOLECYSTECTOMY    . HEMILAMINOTOMY LUMBAR SPINE      There were no vitals filed for this visit.  Subjective Assessment - 12/03/17 1447    Subjective  Has been having increased sciatic symptoms last 4-5 days.       Pertinent History  CAD, CKD, HTN, RA, h/o MI    Diagnostic tests  nothing recent    Patient Stated Goals  improve pain    Currently in Pain?  Yes    Pain Score  5     Pain Location  Back    Pain Orientation  Right;Lower    Pain Descriptors / Indicators  Aching;Discomfort    Pain Type  Chronic pain    Pain Onset  More than a month ago    Pain Frequency  Intermittent    Multiple Pain Sites  No         [facility] PT Assessment - 12/03/17 1535      Assessment   Referring Provider  Dr. Erline Levine     Next MD Visit  5.15.19                    [facility] Adult PT Treatment/Exercise - 12/03/17 1455      Lumbar Exercises: Stretches   Double Knee to Chest Stretch  1 rep;60 seconds    Double Knee to Chest Stretch Limitations  LE on p-ball and therapist guidance on p-ball       Lumbar Exercises: Aerobic   Recumbent Bike  L2 x 6 min      Lumbar Exercises: Supine   Bent Knee Raise  10 reps;3 seconds    Bent Knee Raise Limitations  red TB at knees       Lumbar Exercises: Sidelying   Clam  Right;15 reps;3 seconds    Clam Limitations  red looped band at knees     Other Sidelying Lumbar Exercises  open book - 5 x 5 sec each side reviewed per pt. report of pain HEP; pt. over-rotating       Knee/Hip Exercises: Standing   Hip Abduction  Right;Left;10 reps;Knee straight  Abduction Limitations  counter     Hip Extension  Right;Left;Knee straight;5 reps terminated after five reps due to increased back pain     Extension Limitations  leaning over peanut p-ball      Moist Heat Therapy   Number Minutes Moist Heat  10 Minutes    Moist Heat Location  Lumbar Spine lumbar spine       Electrical Stimulation   Electrical Stimulation Location  R sided lumbar    Electrical Stimulation Action  IFC    Electrical Stimulation Parameters  to tolerance, 10'    Electrical Stimulation Goals  Pain;Tone      Manual Therapy   Manual Therapy  Passive ROM;Soft tissue mobilization;Myofascial release    Manual therapy comments  Supine, sidelying     Soft tissue mobilization  STM to R sided paraspinals, R QL over area of tenderness; Reported relief of pain and "relaxation" of musculature following this      Myofascial Release  TPR to R QL     Passive ROM  Manual R piriformis, SKTC, HS, QL (sidelying with LE off table with therapist support) stretch with therapist x 20 sec each way              PT Education - 12/03/17 1549    Education provided  Yes    Education Details  clam shell with red looped TB issued to pt.      Person(s) Educated  Patient    Methods  Explanation;Demonstration;Verbal cues;Handout    Comprehension  Verbalized understanding;Returned demonstration;Verbal cues required;Need further instruction          PT Long Term Goals - 11/19/17 1501      PT LONG TERM GOAL #1   Title  patient to be independent with advanced HEP    Status  On-going      PT LONG TERM GOAL #2   Title  patient to improve lumbar AROM to WNL in all planes without pain limiting motion    Status  On-going      PT LONG TERM GOAL #3   Title  patient to demonstrate improved flexibility at R hip/buttock for improved pain    Status  On-going      PT LONG TERM GOAL #4   Title  patient to improve B hip strength to >/= 4+/5 without pain    Status  On-going            Plan - 12/03/17 1450    Clinical Impression Statement  [name] reporting increased sciatic symptoms last 4-5 days without known trigger.  Feels "open book" stretch may be causing some of his pain.  Review of this activity revealed pt. over-rotating into painful range thus cued pt. to hold this activity at sensation of "gentle pull" in mid/lower back.  Pt. with good tolerance for "open book" stretch following this.  Pt. tolerated mild progression of lumbopelvic strengthening activities in treatment today well however had poor tolerance for standing hip extension thus terminated.  Manual therapy focusing on R-sided lumbar paraspinals and QL musculature in area of tenderness with good response.  HEP updated.  Ended treatment with E-stim/moist heat to lumbar spine to reduce post exercise pain and tone.  Pt. leaving treatment reporting good reduction in pain and tightness in lower back.  Will continue to progress toward goals.    PT Treatment/Interventions  ADLs/Self Care Home Management;Cryotherapy;Electrical Stimulation;Moist Heat;Traction;Therapeutic exercise;Therapeutic activities;Functional mobility training;Ultrasound;Neuromuscular re-education;Patient/family  education;Manual techniques;Vasopneumatic Device;Taping;Dry needling;Passive range of motion    Consulted  and Agree with Plan of Care  Patient       Patient will benefit from skilled therapeutic intervention in order to improve the following deficits and impairments:  Pain, Decreased range of motion, Decreased mobility, Decreased strength, Decreased activity tolerance  Visit Diagnosis: Chronic right-sided low back pain with right-sided sciatica  Pain in right hip  Other symptoms and signs involving the musculoskeletal system     Problem List Patient Active Problem List   Diagnosis Date Noted  . Coronary artery disease due to lipid rich plaque 07/20/2014  . Hyperlipidemia 07/20/2013  . HTN (hypertension) 07/20/2013  . Angina decubitus ([place]) 07/20/2013  . Old MI (myocardial infarction) 07/20/2013    Bess Harvest, PTA 12/03/17 3:59 PM  [facility] [address]  [place], [place], [address] Phone: [phone]   Fax:  [phone]  Name: PEGGY MONK MRN: [id number] Date of Birth: 09-Feb-1934

## 2017-12-10 ENCOUNTER — Ambulatory Visit: Payer: PPO | Admitting: Physical Therapy

## 2017-12-17 ENCOUNTER — Ambulatory Visit: Payer: PPO | Attending: Neurosurgery

## 2017-12-17 DIAGNOSIS — M5441 Lumbago with sciatica, right side: Secondary | ICD-10-CM | POA: Insufficient documentation

## 2017-12-17 DIAGNOSIS — G8929 Other chronic pain: Secondary | ICD-10-CM | POA: Diagnosis not present

## 2017-12-17 DIAGNOSIS — M25551 Pain in right hip: Secondary | ICD-10-CM

## 2017-12-17 DIAGNOSIS — R29898 Other symptoms and signs involving the musculoskeletal system: Secondary | ICD-10-CM

## 2017-12-17 NOTE — Therapy (Signed)
[facility] [address]  [place], [place], [address] Phone: [phone]   Fax:  [phone]  Physical Therapy Treatment  Patient Details  Name: Connor Hansen MRN: [id number] Date of Birth: 1934-06-17 Referring Provider: Dr. Erline Levine    Encounter Date: 12/17/2017  PT End of Session - 12/17/17 1509    Visit Number  5    Number of Visits  8    Date for PT Re-Evaluation  01/07/18    Authorization Type  [employer]    PT Start Time  1448    PT Stop Time  1532    PT Time Calculation (min)  44 min    Activity Tolerance  Patient tolerated treatment well    Behavior During Therapy  [facility] for tasks assessed/performed       Past Medical History:  Diagnosis Date  . CAD (coronary artery disease)    2/10 - BMS to mid LAD, RCA. Anteroapical akinesis - normal EF. 2010  . CKD (chronic kidney disease)   . Coronary atherosclerosis of native coronary artery   . GERD (gastroesophageal reflux disease)   . HTN (hypertension)   . Hyperlipidemia   . Hypothyroidism   . Old myocardial infarct   . RA (rheumatoid arthritis) ([place])     Past Surgical History:  Procedure Laterality Date  . CHOLECYSTECTOMY    . HEMILAMINOTOMY LUMBAR SPINE      There were no vitals filed for this visit.  Subjective Assessment - 12/17/17 1451    Subjective  Pt. reporting some increased pain at L lower back/buttocks which he states is, "in a new place".  Pt. reporting he has been having soreness in neck which has made it difficult to turn head.      Pertinent History  CAD, CKD, HTN, RA, h/o MI    Diagnostic tests  nothing recent    Patient Stated Goals  improve pain    Currently in Pain?  Yes    Pain Score  8     Pain Location  Back    Pain Orientation  Lower;Left    Pain Descriptors / Indicators  Aching;Sharp    Pain Type  Chronic pain    Pain Onset  In the past 7 days    Pain Frequency  Intermittent    Multiple Pain Sites  No          [facility] PT Assessment - 12/17/17 1527      AROM   Lumbar Flexion  fingertip to mid shins - stretching in leg    Lumbar Extension  50% limited - some pain     Lumbar - Right Side Bend  25% limited - R sided lower back pain     Lumbar - Left Side Bend  25% limited - pain free    Lumbar - Right Rotation  25% limited - pain free    Lumbar - Left Rotation  WNL - pain free      Strength   Strength Assessment Site  Hip;Knee    Right/Left Hip  Right;Left    Right Hip Flexion  4/5    Right Hip ABduction  4+/5    Left Hip Flexion  4/5    Right/Left Knee  Left;Right    Right Knee Flexion  4/5    Right Knee Extension  5/5    Left Knee Flexion  4+/5    Left Knee Extension  4+/5  [facility] Adult PT Treatment/Exercise - 12/17/17 1510      Lumbar Exercises: Stretches   Piriformis Stretch  30 seconds;2 reps;Left    Other Lumbar Stretch Exercise  sciatic nerve glide x 10 reps      Lumbar Exercises: Aerobic   Recumbent Bike  L2 x 6 min      Lumbar Exercises: Standing   Functional Squats  10 reps;3 seconds    Functional Squats Limitations  Cues required to initiate upward motion from glutes       Lumbar Exercises: Seated   Sit to Stand  10 reps pushoff from knees     Sit to Stand Limitations  red TB TB at knees      Lumbar Exercises: Supine   Clam  10 reps;3 seconds    Clam Limitations  Alternating hip abd/ER with red TB at knees       Manual Therapy   Manual Therapy  Passive ROM;Soft tissue mobilization;Myofascial release    Manual therapy comments  Supine, sidelying     Soft tissue mobilization  STM to L glute/piriformis in area of tenderness     Myofascial Release  TPR to L piriformis with good repsonse and decreased ttp in glute following this and report of significant drop in pain levels     Passive ROM  Manual R KTOS, figure 4 stretch with therapist x 20 sec each       Kinesiotix   Inhibit Muscle   Piriformis taping (star pattern) over area of  most tenderness (30% stretch)             PT Education - 12/17/17 1800    Education provided  Yes    Education Details  HEP update     Person(s) Educated  Patient    Methods  Explanation;Demonstration;Verbal cues;Handout    Comprehension  Verbalized understanding;Returned demonstration;Verbal cues required;Need further instruction          PT Long Term Goals - 12/17/17 1816      PT LONG TERM GOAL #1   Title  patient to be independent with advanced HEP    Status  Partially Met met for current HEP       PT LONG TERM GOAL #2   Title  patient to improve lumbar AROM to WNL in all planes without pain limiting motion    Status  Partially Met      PT LONG TERM GOAL #3   Title  patient to demonstrate improved flexibility at R hip/buttock for improved pain    Status  On-going      PT LONG TERM GOAL #4   Title  patient to improve B hip strength to >/= 4+/5 without pain    Status  Partially Met            Plan - 12/17/17 1516    Clinical Impression Statement  Connor Hansen reporting onset of L buttocks/lower back pain over last few days, which he attributes to performing most recently updated HEP activity.  Palpation revealed increased muscular tension/tightness in L glutes/piriformis which responded well to STM/TPR with significant decrease in pain levels from 8/10>2/10 following manual therapy.  Further decrease in pain and increased tolerance for sit<>stand and twisting movements following therex today.  Don able to demo improvement in lumbar AROM since starting therapy and some improvement in LE strength with MMT.  Making progress toward goals.  Ended treatment with K-taping applied to L glute/piriformis for hopeful reduction in pain and tone.  Will monitor response  and continue to progress per pt. response.      PT Treatment/Interventions  ADLs/Self Care Home Management;Cryotherapy;Electrical Stimulation;Moist Heat;Traction;Therapeutic exercise;Therapeutic activities;Functional  mobility training;Ultrasound;Neuromuscular re-education;Patient/family education;Manual techniques;Vasopneumatic Device;Taping;Dry needling;Passive range of motion    Consulted and Agree with Plan of Care  Patient       Patient will benefit from skilled therapeutic intervention in order to improve the following deficits and impairments:  Pain, Decreased range of motion, Decreased mobility, Decreased strength, Decreased activity tolerance  Visit Diagnosis: Chronic right-sided low back pain with right-sided sciatica  Pain in right hip  Other symptoms and signs involving the musculoskeletal system     Problem List Patient Active Problem List   Diagnosis Date Noted  . Coronary artery disease due to lipid rich plaque 07/20/2014  . Hyperlipidemia 07/20/2013  . HTN (hypertension) 07/20/2013  . Angina decubitus ([place]) 07/20/2013  . Old MI (myocardial infarction) 07/20/2013   Bess Harvest, PTA 12/17/17 6:18 PM  [facility] [address]  [place], [place], [address] Phone: [phone]   Fax:  [phone]  Name: NISHAWN ROTAN MRN: [id number] Date of Birth: 04-02-1934

## 2017-12-23 ENCOUNTER — Ambulatory Visit: Payer: PPO | Admitting: Physical Therapy

## 2017-12-24 ENCOUNTER — Ambulatory Visit: Payer: PPO

## 2017-12-24 ENCOUNTER — Ambulatory Visit: Payer: PPO | Admitting: Physical Therapy

## 2017-12-24 DIAGNOSIS — G8929 Other chronic pain: Secondary | ICD-10-CM

## 2017-12-24 DIAGNOSIS — M5441 Lumbago with sciatica, right side: Principal | ICD-10-CM

## 2017-12-24 DIAGNOSIS — M25551 Pain in right hip: Secondary | ICD-10-CM

## 2017-12-24 DIAGNOSIS — R29898 Other symptoms and signs involving the musculoskeletal system: Secondary | ICD-10-CM

## 2017-12-24 NOTE — Therapy (Signed)
[facility] [address]  [place], [place], [address] Phone: [phone]   Fax:  [phone]  Physical Therapy Treatment  Patient Details  Name: Connor Hansen MRN: [id number] Date of Birth: 01-Jul-1934 Referring Provider: Dr. Erline Levine    Encounter Date: 12/24/2017  PT End of Session - 12/24/17 1433    Visit Number  6    Number of Visits  8    Date for PT Re-Evaluation  01/07/18    Authorization Type  [employer]    PT Start Time  1425    PT Stop Time  1540    PT Time Calculation (min)  75 min    Activity Tolerance  Patient tolerated treatment well    Behavior During Therapy  [facility] for tasks assessed/performed       Past Medical History:  Diagnosis Date  . CAD (coronary artery disease)    2/10 - BMS to mid LAD, RCA. Anteroapical akinesis - normal EF. 2010  . CKD (chronic kidney disease)   . Coronary atherosclerosis of native coronary artery   . GERD (gastroesophageal reflux disease)   . HTN (hypertension)   . Hyperlipidemia   . Hypothyroidism   . Old myocardial infarct   . RA (rheumatoid arthritis) ([place])     Past Surgical History:  Procedure Laterality Date  . CHOLECYSTECTOMY    . HEMILAMINOTOMY LUMBAR SPINE      There were no vitals filed for this visit.  Subjective Assessment - 12/24/17 1431    Subjective  Pt. reporting he is unsure of benefit from taping applied last visit and has felt increased L buttocks pain since last visit which he states has made it difficult to stand up straight.      Pertinent History  CAD, CKD, HTN, RA, h/o MI    Diagnostic tests  nothing recent    Patient Stated Goals  improve pain    Currently in Pain?  Yes    Pain Score  8     Pain Location  Back    Pain Orientation  Left;Lower    Pain Descriptors / Indicators  Aching;Sharp    Pain Type  Chronic pain    Pain Radiating Towards  radiating into L buttocks     Pain Frequency  Intermittent    Aggravating Factors    Bending     Pain Relieving Factors  Heat     Multiple Pain Sites  No                       [facility] Adult PT Treatment/Exercise - 12/24/17 1503      Lumbar Exercises: Stretches   Piriformis Stretch  30 seconds;Left;1 rep poor tolerance     Piriformis Stretch Limitations  seated     Other Lumbar Stretch Exercise  sciatic nerve glide 2 x 10 reps seated and supine       Lumbar Exercises: Aerobic   Nustep  Lvl 4, 7 min       Lumbar Exercises: Standing   Functional Squats  10 reps;3 seconds    Functional Squats Limitations  TRX - focusing on "cracking walnut" with glute contraction to initiate movement     Other Standing Lumbar Exercises  B pallof press with double red TB x 10 reps  Cues required for proper motion       Lumbar Exercises: Seated   Sit to Stand  10 reps pushing off from knees; cues  for glute contraction     Other Seated Lumbar Exercises  Seated fitter leg press (1 blue, 1 black band) x 10 reps       Lumbar Exercises: Supine   Clam  10 reps;3 seconds    Clam Limitations  Alternating hip abd/ER with red TB at knees       Modalities   Modalities  Traction      Moist Heat Therapy   Number Minutes Moist Heat  10 Minutes    Moist Heat Location  -- L buttocks       Electrical Stimulation   Electrical Stimulation Location  L buttocks     Electrical Stimulation Action  IFC    Electrical Stimulation Parameters  to tolerance, 10'     Electrical Stimulation Goals  Pain;Tone      Traction   Type of Traction  Lumbar    Min (lbs)  45    Max (lbs)  55    Hold Time  60    Rest Time  20    Time  10      Manual Therapy   Manual Therapy  Passive ROM;Soft tissue mobilization;Myofascial release    Manual therapy comments  Supine, sidelying     Soft tissue mobilization  STM to L glute/piriformis in area of tenderness     Myofascial Release  TPR to L glute/piriformis; good response  significant relief from L buttocks pain/back pain following     Passive ROM   Manual L KTOS, modified piri, HS (with sciatic nerve glide) stretch with therapist x 20 sec each  significant relief from L buttocks pain/back pain following                   PT Long Term Goals - 12/17/17 1816      PT LONG TERM GOAL #1   Title  patient to be independent with advanced HEP    Status  Partially Met met for current HEP       PT LONG TERM GOAL #2   Title  patient to improve lumbar AROM to WNL in all planes without pain limiting motion    Status  Partially Met      PT LONG TERM GOAL #3   Title  patient to demonstrate improved flexibility at R hip/buttock for improved pain    Status  On-going      PT LONG TERM GOAL #4   Title  patient to improve B hip strength to >/= 4+/5 without pain    Status  Partially Met            Plan - 12/24/17 1433    Clinical Impression Statement  Pt. seen to start treatment with complaint of worsening L buttocks back pain over this past week noting limited relief from taping applied last visit.  Pt. ambulating into session with notable trunk flexion and wt. shift away from L LE.  Pt. noting significant improvement in L buttocks/back pain following STM/TPR and manual stretching today with pain reduction from 8/10>2/10.  Pt. noting further pain reduction following lumbopelvic strengthening activities today.  Pt. requesting lumbar traction to end session, as he has felt good benefit from this in past therapy.  Ended session with E-stim/moist heat to L buttocks and lumbar traction in neutral, hooklying with 55#/45# pull.  Pt. leaving treatment ambulating with good upright posture, increased step length, and noting pain free.  Further discussed pt. HEP today as pt. convinced he "pulled a muscle" with band  resisted, sidelying clam shell activity, and has previously had poor tolerance for HEP additions of "open book" stretch, and LTR.  Pt. instructed to avoid these HEP activities and continue with tennis ball glute release on wall if this  provides relief over next few days.  Will plan to monitor response and continue to progress per pt. tolerance in coming visits.      PT Treatment/Interventions  ADLs/Self Care Home Management;Cryotherapy;Electrical Stimulation;Moist Heat;Traction;Therapeutic exercise;Therapeutic activities;Functional mobility training;Ultrasound;Neuromuscular re-education;Patient/family education;Manual techniques;Vasopneumatic Device;Taping;Dry needling;Passive range of motion    Consulted and Agree with Plan of Care  Patient       Patient will benefit from skilled therapeutic intervention in order to improve the following deficits and impairments:  Pain, Decreased range of motion, Decreased mobility, Decreased strength, Decreased activity tolerance  Visit Diagnosis: Chronic right-sided low back pain with right-sided sciatica  Pain in right hip  Other symptoms and signs involving the musculoskeletal system     Problem List Patient Active Problem List   Diagnosis Date Noted  . Coronary artery disease due to lipid rich plaque 07/20/2014  . Hyperlipidemia 07/20/2013  . HTN (hypertension) 07/20/2013  . Angina decubitus ([place]) 07/20/2013  . Old MI (myocardial infarction) 07/20/2013    Bess Harvest, PTA 12/24/17 6:38 PM  [facility] [address]  [place], [place], [address] Phone: [phone]   Fax:  [phone]  Name: ROBEY MASSMANN MRN: [id number] Date of Birth: 1934-03-08

## 2017-12-25 ENCOUNTER — Ambulatory Visit: Payer: PPO

## 2017-12-31 ENCOUNTER — Ambulatory Visit: Payer: PPO

## 2017-12-31 DIAGNOSIS — G8929 Other chronic pain: Secondary | ICD-10-CM

## 2017-12-31 DIAGNOSIS — M5441 Lumbago with sciatica, right side: Principal | ICD-10-CM

## 2017-12-31 DIAGNOSIS — R29898 Other symptoms and signs involving the musculoskeletal system: Secondary | ICD-10-CM

## 2017-12-31 DIAGNOSIS — M25551 Pain in right hip: Secondary | ICD-10-CM

## 2017-12-31 NOTE — Therapy (Signed)
[facility] [address]  [place], [place], [address] Phone: [phone]   Fax:  [phone]  Physical Therapy Treatment  Patient Details  Name: Connor Hansen MRN: [id number] Date of Birth: [birth year] Referring Provider: Dr. Erline Levine  Progress Note Reporting Period 11/12/17 to 12/31/17  See note below for Objective Data and Assessment of Progress/Goals.      Encounter Date: 12/31/2017  PT End of Session - 12/31/17 1106    Visit Number  7    Number of Visits  8    Date for PT Re-Evaluation  01/07/18    Authorization Type  [employer]    PT Start Time  1100    PT Stop Time  1207    PT Time Calculation (min)  67 min    Activity Tolerance  Patient tolerated treatment well    Behavior During Therapy  [facility] for tasks assessed/performed       Past Medical History:  Diagnosis Date  . CAD (coronary artery disease)    2/10 - BMS to mid LAD, RCA. Anteroapical akinesis - normal EF. 2010  . CKD (chronic kidney disease)   . Coronary atherosclerosis of native coronary artery   . GERD (gastroesophageal reflux disease)   . HTN (hypertension)   . Hyperlipidemia   . Hypothyroidism   . Old myocardial infarct   . RA (rheumatoid arthritis) ([place])     Past Surgical History:  Procedure Laterality Date  . CHOLECYSTECTOMY    . HEMILAMINOTOMY LUMBAR SPINE      There were no vitals filed for this visit.  Subjective Assessment - 12/31/17 1102    Subjective  Pt. noting 3-4 days relief from last visit and attributes this to mechanics traction.      Pertinent History  CAD, CKD, HTN, RA, h/o MI    Diagnostic tests  nothing recent    Patient Stated Goals  improve pain    Currently in Pain?  Yes    Pain Score  3     Pain Location  Back    Pain Orientation  Left;Lower    Pain Descriptors / Indicators  Aching;Sharp    Pain Type  Chronic pain    Pain Radiating Towards  Radiating pain into L buttocks          [facility] PT  Assessment - 12/31/17 1136      Assessment   Referring Provider  Dr. Erline Levine    Next MD Visit  ~ 6.1.19      AROM   AROM Assessment Site  Lumbar    Lumbar Flexion  fingertip to mid shins - stretching in leg    Lumbar Extension  50% limited - some pain     Lumbar - Right Side Bend  25% limited - some pain      Lumbar - Left Side Bend  25% limited - pain free    Lumbar - Right Rotation  25% limited - pain free    Lumbar - Left Rotation  WNL - pain free      Strength   Strength Assessment Site  Hip;Knee    Right/Left Hip  Right;Left    Right Hip Flexion  4+/5    Right Hip ABduction  4+/5    Left Hip Flexion  4+/5    Left Hip ABduction  4/5    Right/Left Knee  Left;Right    Right Knee Flexion  4+/5    Right Knee Extension  5/5    Left Knee Flexion  4+/5    Left Knee Extension  4+/5                   [facility] Adult PT Treatment/Exercise - 12/31/17 1200      Lumbar Exercises: Stretches   Single Knee to Chest Stretch  Right;Left;30 seconds;2 reps      Lumbar Exercises: Aerobic   Nustep  Lvl 5, 6 min       Lumbar Exercises: Machines for Strengthening   Other Lumbar Machine Exercise  BATCA low row 15# x 15 reps; focusing on abdominal bracing and upright posture       Lumbar Exercises: Standing   Functional Squats  15 reps;3 seconds    Functional Squats Limitations  counter - cues to contract glutes with motion       Lumbar Exercises: Seated   Other Seated Lumbar Exercises  Seated hip alternating abd/ER with red TB at knees x 10 reps each way       Knee/Hip Exercises: Standing   Hip Flexion  Right;Left;10 reps;Knee bent;Stengthening    Hip Flexion Limitations  2#      Moist Heat Therapy   Number Minutes Moist Heat  15 Minutes    Moist Heat Location  Hip L buttocks       Electrical Stimulation   Electrical Stimulation Location  L buttocks     Electrical Stimulation Action  IFC    Electrical Stimulation Parameters  to tolerance, 15'     Electrical  Stimulation Goals  Pain;Tone      Traction   Type of Traction  Lumbar    Min (lbs)  50    Max (lbs)  60    Hold Time  60    Rest Time  20    Time  13      Manual Therapy   Manual Therapy  Passive ROM;Soft tissue mobilization;Myofascial release    Manual therapy comments  Supine, sidelying     Soft tissue mobilization  STM to L buttocks, L QL, L paraspinals     Myofascial Release  TPR to L Piriformis along length of muscle (tender throughout)    Passive ROM  Manual L KTOS, modified piri, HS with stretch with therapist x 20 sec each              PT Education - 12/31/17 1207    Education provided  Yes    Education Details  HEP update    Person(s) Educated  Patient    Methods  Explanation;Demonstration;Verbal cues;Handout    Comprehension  Verbalized understanding;Returned demonstration;Verbal cues required;Need further instruction          PT Long Term Goals - 12/31/17 1135      PT LONG TERM GOAL #1   Title  patient to be independent with advanced HEP    Status  Partially Met met for current HEP       PT LONG TERM GOAL #2   Title  patient to improve lumbar AROM to WNL in all planes without pain limiting motion    Status  Partially Met      PT LONG TERM GOAL #3   Title  patient to demonstrate improved flexibility at R hip/buttock for improved pain    Status  On-going      PT LONG TERM GOAL #4   Title  patient to improve B hip strength to >/= 4+/5 without pain    Status  Partially Met  Some pain reproduction in lower back with max level hip abduction resistance             Plan - 12/31/17 1122    Clinical Impression Statement  [name] noting good benefit from physical therapy thus far.  Is making good progress toward goals.  Feels lumbar traction initiated last visit has provided 3-4 day relief from LBP/buttocks pain.  Wishes to continue with PT to reduce his overall pain levels.  Don able to demo improvement in hip strength with MMT today however still  demonstrating mod tightness in glutes, HS, and proximal hip musculature.  Pt. tolerated advancement of lumbopelvic strengthening activities today well and progression of lumbar traction to 60#/50#.  Did still present with ttp and TP in L glute/piriformis today which was addressed with manual therapy with some relief following this.  Pt. leaving session today noting good reduction in pain levels and will continue to benefit from further skilled therapy for improvement in tissue quality and LE strength for improved functional tolerance.      PT Treatment/Interventions  ADLs/Self Care Home Management;Cryotherapy;Electrical Stimulation;Moist Heat;Traction;Therapeutic exercise;Therapeutic activities;Functional mobility training;Ultrasound;Neuromuscular re-education;Patient/family education;Manual techniques;Vasopneumatic Device;Taping;Dry needling;Passive range of motion    Consulted and Agree with Plan of Care  Patient       Patient will benefit from skilled therapeutic intervention in order to improve the following deficits and impairments:  Pain, Decreased range of motion, Decreased mobility, Decreased strength, Decreased activity tolerance  Visit Diagnosis: Chronic right-sided low back pain with right-sided sciatica  Pain in right hip  Other symptoms and signs involving the musculoskeletal system     Problem List Patient Active Problem List   Diagnosis Date Noted  . Coronary artery disease due to lipid rich plaque 07/20/2014  . Hyperlipidemia 07/20/2013  . HTN (hypertension) 07/20/2013  . Angina decubitus ([place]) 07/20/2013  . Old MI (myocardial infarction) 07/20/2013    Bess Harvest, PTA 12/31/17 12:33 PM   Mr. Alsip doing well clinically. Initially with some slow progress as we teased out root cause of back pain, which at this point seem to be rotary activities. Patient reporting good compliance with HEP thus far without issue. Now, finding great benefit from mechanical traction. Will  plan to extend POC to progress functional mobility and QOL.  Lanney Gins, PT, DPT 12/31/17 2:33 PM   [facility] [address]  [place], [place], [address] Phone: [phone]   Fax:  [phone]  Name: HUBBERT LANDRIGAN MRN: [id number] Date of Birth: 03/08/1934

## 2018-01-06 ENCOUNTER — Other Ambulatory Visit: Payer: Self-pay | Admitting: Cardiology

## 2018-01-07 ENCOUNTER — Encounter: Payer: Self-pay | Admitting: Physical Therapy

## 2018-01-07 ENCOUNTER — Ambulatory Visit: Payer: PPO | Admitting: Physical Therapy

## 2018-01-07 DIAGNOSIS — M5441 Lumbago with sciatica, right side: Secondary | ICD-10-CM | POA: Diagnosis not present

## 2018-01-07 DIAGNOSIS — M25551 Pain in right hip: Secondary | ICD-10-CM

## 2018-01-07 DIAGNOSIS — R29898 Other symptoms and signs involving the musculoskeletal system: Secondary | ICD-10-CM

## 2018-01-07 DIAGNOSIS — G8929 Other chronic pain: Secondary | ICD-10-CM

## 2018-01-07 NOTE — Therapy (Signed)
[facility] [address]  [place], [place], [address] Phone: [phone]   Fax:  [phone]  Physical Therapy Treatment  Patient Details  Name: Connor Hansen MRN: [id number] Date of Birth: 01-24-1934 Referring Provider: Dr. Erline Levine   Encounter Date: 01/07/2018  PT End of Session - 01/07/18 1816    Visit Number  8    Number of Visits  16    Date for PT Re-Evaluation  02/25/18    Authorization Type  [employer]    PT Start Time  1525    PT Stop Time  1621    PT Time Calculation (min)  56 min    Activity Tolerance  Patient tolerated treatment well    Behavior During Therapy  [facility] for tasks assessed/performed       Past Medical History:  Diagnosis Date  . CAD (coronary artery disease)    2/10 - BMS to mid LAD, RCA. Anteroapical akinesis - normal EF. 2010  . CKD (chronic kidney disease)   . Coronary atherosclerosis of native coronary artery   . GERD (gastroesophageal reflux disease)   . HTN (hypertension)   . Hyperlipidemia   . Hypothyroidism   . Old myocardial infarct   . RA (rheumatoid arthritis) ([place])     Past Surgical History:  Procedure Laterality Date  . CHOLECYSTECTOMY    . HEMILAMINOTOMY LUMBAR SPINE      There were no vitals filed for this visit.  Subjective Assessment - 01/07/18 1527    Subjective  Patient reports that L sided sciatica has kicked in lately. Reports he has not done exercises today or yesterday and thinks it has been better because of that.    Pertinent History  CAD, CKD, HTN, RA, h/o MI    Diagnostic tests  nothing recent    Patient Stated Goals  improve pain    Pain Score  3     Pain Location  Back    Pain Orientation  Right;Left;Lower    Pain Descriptors / Indicators  Aching;[facility] Adult PT Treatment/Exercise - 01/07/18 1527      Lumbar Exercises: Stretches   Single Knee to Chest Stretch  Right;2 reps;20 seconds KTOS     Figure 4 Stretch  1 rep;20 seconds;With overpressure B LEs      Lumbar Exercises: Aerobic   Nustep  Lvl 5, 6 min       Lumbar Exercises: Supine   Bridge  10 reps cues to activate core and glutes throughout; avoid valsalva    Bridge with clamshell  10 reps red TB around knees    Other Supine Lumbar Exercises  Hooklying pallof press with green TB; 10x each side    Other Supine Lumbar Exercises  sciatic nerve glide with strap; 10x each LE      Electrical Stimulation   Electrical Stimulation Location  R buttocks     Electrical Stimulation Action  IFC    Electrical Stimulation Parameters  to tolerance; 10 min; output 22    Electrical Stimulation Goals  Pain      Traction   Type of Traction  Lumbar    Min (lbs)  50    Max (lbs)  65    Hold Time  60    Rest Time  20    Time  15  Manual Therapy   Manual Therapy  Soft tissue mobilization    Manual therapy comments  Supine, sidelying     Soft tissue mobilization  L glute, L piriformis, R thoracolumbar paraspinals, R glute;self- STM to L&R glute  2 min                  PT Long Term Goals - 12/31/17 1135      PT LONG TERM GOAL #1   Title  patient to be independent with advanced HEP    Status  Partially Met met for current HEP       PT LONG TERM GOAL #2   Title  patient to improve lumbar AROM to WNL in all planes without pain limiting motion    Status  Partially Met      PT LONG TERM GOAL #3   Title  patient to demonstrate improved flexibility at R hip/buttock for improved pain    Status  On-going      PT LONG TERM GOAL #4   Title  patient to improve B hip strength to >/= 4+/5 without pain    Status  Partially Met Some pain reproduction in lower back with max level hip abduction resistance             Plan - 01/07/18 1817    Clinical Impression Statement  Patient arrived to session with report that L sided sciatic has been acting up lately, but has not been doing HEP at home and attributes his  improvement for the past few days to this. Tolerated STM to L glute and piriformis and R thoracolumbar paraspinals; soft tissue restriction noted in L glute which improved after STM. Patient tolerated supine core and hip strengthening. Attempted supine figure 4 stretch on B LEs without report of pain, which he reports usually bothers him. Taught patient to perform self-STM with ball to B glutes; performed to tolerance against wall. Received supine e-stim to R buttock and lumbar traction in supine at end of session. Normal integumentary response noted at end of session.    PT Treatment/Interventions  ADLs/Self Care Home Management;Cryotherapy;Electrical Stimulation;Moist Heat;Traction;Therapeutic exercise;Therapeutic activities;Functional mobility training;Ultrasound;Neuromuscular re-education;Patient/family education;Manual techniques;Vasopneumatic Device;Taping;Dry needling;Passive range of motion    Consulted and Agree with Plan of Care  Patient       Patient will benefit from skilled therapeutic intervention in order to improve the following deficits and impairments:  Pain, Decreased range of motion, Decreased mobility, Decreased strength, Decreased activity tolerance  Visit Diagnosis: Chronic right-sided low back pain with right-sided sciatica  Pain in right hip  Other symptoms and signs involving the musculoskeletal system     Problem List Patient Active Problem List   Diagnosis Date Noted  . Coronary artery disease due to lipid rich plaque 07/20/2014  . Hyperlipidemia 07/20/2013  . HTN (hypertension) 07/20/2013  . Angina decubitus ([place]) 07/20/2013  . Old MI (myocardial infarction) 07/20/2013     Janene Harvey, PT, DPT 01/07/18 6:21 PM   [facility] [address]  [address] [place], [place], [address] Phone: [phone]   Fax:  [phone]  Name: Connor Hansen MRN: [id number] Date of Birth: 06-May-1934

## 2018-01-14 ENCOUNTER — Telehealth: Payer: Self-pay | Admitting: Cardiology

## 2018-01-14 ENCOUNTER — Ambulatory Visit: Payer: PPO

## 2018-01-14 DIAGNOSIS — M159 Polyosteoarthritis, unspecified: Secondary | ICD-10-CM | POA: Diagnosis not present

## 2018-01-14 DIAGNOSIS — M1009 Idiopathic gout, multiple sites: Secondary | ICD-10-CM | POA: Diagnosis not present

## 2018-01-14 DIAGNOSIS — Z79899 Other long term (current) drug therapy: Secondary | ICD-10-CM | POA: Diagnosis not present

## 2018-01-14 DIAGNOSIS — M2550 Pain in unspecified joint: Secondary | ICD-10-CM | POA: Diagnosis not present

## 2018-01-14 DIAGNOSIS — Z6828 Body mass index (BMI) 28.0-28.9, adult: Secondary | ICD-10-CM | POA: Diagnosis not present

## 2018-01-14 DIAGNOSIS — E663 Overweight: Secondary | ICD-10-CM | POA: Diagnosis not present

## 2018-01-14 DIAGNOSIS — M0609 Rheumatoid arthritis without rheumatoid factor, multiple sites: Secondary | ICD-10-CM | POA: Diagnosis not present

## 2018-01-14 MED ORDER — CARVEDILOL 3.125 MG PO TABS: 3.1250 mg | ORAL_TABLET | Freq: Two times a day (BID) | ORAL | 0 refills | Status: DC

## 2018-01-14 NOTE — Addendum Note (Signed)
Addended by: Juventino Slovak on: 01/14/2018 03:49 PM   Modules accepted: Orders

## 2018-01-14 NOTE — Telephone Encounter (Signed)
[place] and talked to [place]. She is requesting patient to have a 90 day supply for insurance. Patient has a follow up office visit in July. Asked them to fill Coreg for 90 days with 0 refills, and patient can have refills after office visit. Will forward to Dr. Marlou Porch and his nurse.

## 2018-01-14 NOTE — Telephone Encounter (Signed)
ENw essage   Pt c/o medication issue:  1. Name of Medication: carvedilol (COREG) 3.125 MG tablet  2. How are you currently taking this medication (dosage and times per day)? Take 1 tablet (3.125 mg total) by mouth 2 (two) times daily with a meal. Patient needs to schedule an appt for further refills 1st attempt  3. Are you having a reaction (difficulty breathing--STAT)? no  4. What is your medication issue? Requesting 180 tablets refill and needs a verbal. Please call

## 2018-01-15 ENCOUNTER — Ambulatory Visit: Payer: PPO | Attending: Neurosurgery

## 2018-01-15 DIAGNOSIS — G8929 Other chronic pain: Secondary | ICD-10-CM | POA: Diagnosis not present

## 2018-01-15 DIAGNOSIS — M25551 Pain in right hip: Secondary | ICD-10-CM | POA: Insufficient documentation

## 2018-01-15 DIAGNOSIS — M5441 Lumbago with sciatica, right side: Secondary | ICD-10-CM | POA: Diagnosis not present

## 2018-01-15 DIAGNOSIS — R29898 Other symptoms and signs involving the musculoskeletal system: Secondary | ICD-10-CM | POA: Diagnosis not present

## 2018-01-15 NOTE — Therapy (Signed)
[facility] [address]  [place], [place], [address] Phone: [phone]   Fax:  [phone]  Physical Therapy Progress Note  Patient Details  Name: Connor Hansen MRN: [id number] Date of Birth: 1934-04-01 Referring Provider: Dr. Erline Levine    Progress Note Reporting Period 11/12/17 to 01/15/18  See note below for Objective Data and Assessment of Progress/Goals.    Encounter Date: 01/15/2018  PT End of Session - 01/15/18 1451    Visit Number  10 visit count corrected from last visit    Number of Visits  16    Date for PT Re-Evaluation  02/25/18    Authorization Type  [employer]    PT Start Time  1446    PT Stop Time  1545    PT Time Calculation (min)  59 min    Activity Tolerance  Patient tolerated treatment well    Behavior During Therapy  [facility] for tasks assessed/performed       Past Medical History:  Diagnosis Date  . CAD (coronary artery disease)    2/10 - BMS to mid LAD, RCA. Anteroapical akinesis - normal EF. 2010  . CKD (chronic kidney disease)   . Coronary atherosclerosis of native coronary artery   . GERD (gastroesophageal reflux disease)   . HTN (hypertension)   . Hyperlipidemia   . Hypothyroidism   . Old myocardial infarct   . RA (rheumatoid arthritis) ([place])     Past Surgical History:  Procedure Laterality Date  . CHOLECYSTECTOMY    . HEMILAMINOTOMY LUMBAR SPINE      There were no vitals filed for this visit.  Subjective Assessment - 01/15/18 1447    Subjective  Pt. noting he is walking 2 miles 5x/wk in morning and feels this is helping his pain levels.      Pertinent History  CAD, CKD, HTN, RA, h/o MI    How long can you sit comfortably?  30 min     How long can you stand comfortably?  1 hour     How long can you walk comfortably?  35 min     Diagnostic tests  nothing recent    Patient Stated Goals  improve pain    Currently in Pain?  Yes    Pain Score  5     Pain Location   Buttocks    Pain Orientation  Right    Pain Descriptors / Indicators  Aching;Burning    Pain Type  Chronic pain    Pain Radiating Towards  Radiating pain into R LE          [facility] PT Assessment - 01/15/18 1507      Observation/Other Assessments   Focus on Therapeutic Outcomes (FOTO)   58% (42% limitation)      AROM   AROM Assessment Site  Lumbar    Lumbar Flexion  fingertip to mid shins - stretching in leg    Lumbar Extension  50% limited - some pain     Lumbar - Right Side Bend  25% limited - some pain      Lumbar - Left Side Bend  25% limited - pain free    Lumbar - Right Rotation  25% limited - pain free    Lumbar - Left Rotation  WNL - pain free      Strength   Strength Assessment Site  Hip;Knee    Right/Left Hip  Right;Left    Right Hip Flexion  4+/5  Right Hip ABduction  4+/5    Left Hip Flexion  4+/5    Left Hip ABduction  4+/5    Right/Left Knee  Left;Right    Right Knee Flexion  5/5    Right Knee Extension  5/5    Left Knee Flexion  5/5    Left Knee Extension  5/5                   [facility] Adult PT Treatment/Exercise - 01/15/18 1520      Lumbar Exercises: Stretches   Piriformis Stretch  30 seconds;1 rep;Right    Piriformis Stretch Limitations  Supine     Other Lumbar Stretch Exercise  seated sciatic nerve glide x 10 reps      Lumbar Exercises: Aerobic   Nustep  Lvl 5, 6 min       Lumbar Exercises: Supine   Bent Knee Raise  3 seconds x 12 rpes     Bent Knee Raise Limitations  + abdom. bracing     Bridge  -- x 12 reps  - cues to avoid holding breath    Other Supine Lumbar Exercises  sciatic nerve glide with strap; 10x each LE      Traction   Type of Traction  Lumbar    Min (lbs)  65    Max (lbs)  80    Hold Time  60    Rest Time  20    Time  15      Manual Therapy   Manual Therapy  Soft tissue mobilization    Manual therapy comments  Sidelying     Soft tissue mobilization  R glute, R piriformis     Myofascial Release  TPR to R  Piriformis along length of muscle (tender throughout); pt. noting good relief following this     Passive ROM  Manual R KTOS, modified piri, HS with stretch with therapist x 20 sec each                   PT Long Term Goals - 01/15/18 1507      PT LONG TERM GOAL #1   Title  patient to be independent with advanced HEP    Status  Partially Met met for current HEP       PT LONG TERM GOAL #2   Title  patient to improve lumbar AROM to WNL in all planes without pain limiting motion    Status  Partially Met      PT LONG TERM GOAL #3   Title  patient to demonstrate improved flexibility at R hip/buttock for improved pain    Status  On-going      PT LONG TERM GOAL #4   Title  patient to improve B hip strength to >/= 4+/5 without pain    Status  Achieved            Plan - 01/15/18 1505    Clinical Impression Statement  Pt. noting most difficulty with prolonged sitting and bending now at home.  Walking 5x/week in mornings for two miles distance and feels this is helping his pain levels improve.  Notes ~ 50% improvement in overall pain levels since starting therapy and has felt most benefit from lumbar traction.  Pt. able to demo improvement in LE strength with MMT today able to meet strength goal.  Pt. still has potential to benefit from further skilled therapy to improve functional ROM and strength to improve quality of life and  return to leisure activities.   On track to meet remaining LTG's.      PT Treatment/Interventions  ADLs/Self Care Home Management;Cryotherapy;Electrical Stimulation;Moist Heat;Traction;Therapeutic exercise;Therapeutic activities;Functional mobility training;Ultrasound;Neuromuscular re-education;Patient/family education;Manual techniques;Vasopneumatic Device;Taping;Dry needling;Passive range of motion    Consulted and Agree with Plan of Care  Patient       Patient will benefit from skilled therapeutic intervention in order to improve the following deficits  and impairments:  Pain, Decreased range of motion, Decreased mobility, Decreased strength, Decreased activity tolerance  Visit Diagnosis: Chronic right-sided low back pain with right-sided sciatica  Pain in right hip  Other symptoms and signs involving the musculoskeletal system     Problem List Patient Active Problem List   Diagnosis Date Noted  . Coronary artery disease due to lipid rich plaque 07/20/2014  . Hyperlipidemia 07/20/2013  . HTN (hypertension) 07/20/2013  . Angina decubitus ([place]) 07/20/2013  . Old MI (myocardial infarction) 07/20/2013    Bess Harvest, PTA 01/15/18 6:11 PM  [facility] [address]  [place], [place], [address] Phone: [phone]   Fax:  [phone]  Name: Connor Hansen MRN: [id number] Date of Birth: Mar 25, 1934    Patient reports 75% improvement in L buttock pain, now only remaining L LBP. Patient notes improvement in LE strength as evidenced by ability to tolerate prolonged walking- up to 2 miles a day at this time. Patient has met strength goals at this time. Patient still shows room for improvement in LE flexibility and lumbar ROM- pain with lumbar flexion especially. Patient has shown good improvement thus far and on track to meet remaining goals in future visits of POC- patient in agreement.    Janene Harvey, PT, DPT 01/15/18 6:19 PM

## 2018-01-22 ENCOUNTER — Encounter: Payer: Self-pay | Admitting: Physical Therapy

## 2018-01-22 ENCOUNTER — Ambulatory Visit: Payer: PPO | Admitting: Physical Therapy

## 2018-01-22 DIAGNOSIS — M5441 Lumbago with sciatica, right side: Secondary | ICD-10-CM | POA: Diagnosis not present

## 2018-01-22 DIAGNOSIS — G8929 Other chronic pain: Secondary | ICD-10-CM

## 2018-01-22 DIAGNOSIS — M25551 Pain in right hip: Secondary | ICD-10-CM

## 2018-01-22 DIAGNOSIS — R29898 Other symptoms and signs involving the musculoskeletal system: Secondary | ICD-10-CM

## 2018-01-22 NOTE — Therapy (Signed)
[facility] [address]  [place], [place], [address] Phone: [phone]   Fax:  [phone]  Physical Therapy Treatment  Patient Details  Name: Connor Hansen MRN: [id number] Date of Birth: May 20, 1934 Referring Provider: Dr. Erline Levine   Encounter Date: 01/22/2018  PT End of Session - 01/22/18 1712    Visit Number  11    Number of Visits  16    Date for PT Re-Evaluation  02/25/18    Authorization Type  [employer]    PT Start Time  1508    PT Stop Time  1616    PT Time Calculation (min)  68 min    Activity Tolerance  Patient tolerated treatment well    Behavior During Therapy  [facility] for tasks assessed/performed       Past Medical History:  Diagnosis Date  . CAD (coronary artery disease)    2/10 - BMS to mid LAD, RCA. Anteroapical akinesis - normal EF. 2010  . CKD (chronic kidney disease)   . Coronary atherosclerosis of native coronary artery   . GERD (gastroesophageal reflux disease)   . HTN (hypertension)   . Hyperlipidemia   . Hypothyroidism   . Old myocardial infarct   . RA (rheumatoid arthritis) ([place])     Past Surgical History:  Procedure Laterality Date  . CHOLECYSTECTOMY    . HEMILAMINOTOMY LUMBAR SPINE      There were no vitals filed for this visit.  Subjective Assessment - 01/22/18 1509    Subjective  Reports R sided sciatic-type pain; worse with sitting. Feels better with SKTC and HS stretch. Still walking 2 miles 5x/week.     Diagnostic tests  nothing recent    Patient Stated Goals  improve pain    Currently in Pain?  Yes    Pain Score  3     Pain Location  Back    Pain Orientation  Right    Pain Descriptors / Indicators  Burning    Pain Type  Chronic pain                       [facility] Adult PT Treatment/Exercise - 01/22/18 0001      Lumbar Exercises: Stretches   Passive Hamstring Stretch  Right;2 reps;20 seconds    Passive Hamstring Stretch Limitations  supine  with strap    Single Knee to Chest Stretch  Right;2 reps;30 seconds    Piriformis Stretch  Right;2 reps;30 seconds      Lumbar Exercises: Aerobic   Nustep  Lvl 5, 6 min       Lumbar Exercises: Machines for Strengthening   Other Lumbar Machine Exercise  BATCA low row 20# x 15 reps; focusing on abdominal bracing and upright posture       Lumbar Exercises: Standing   Functional Squats  15 reps;3 seconds    Functional Squats Limitations  counter - cues to contract glutes with motion     Other Standing Lumbar Exercises  Standing marching at counter top; 4#     Other Standing Lumbar Exercises  Sidestepping with red TB; 4x length of counter top      Lumbar Exercises: Supine   Bridge  15 reps HS bridge; VCs to avoid valsalva    Bridge with clamshell  15 reps red TB around knees in ER    Other Supine Lumbar Exercises  sciatic nerve glide with; 20x R LE      Electrical  Stimulation   Electrical Stimulation Location  R buttocks     Electrical Stimulation Action  IFC    Electrical Stimulation Parameters  Output: 23 to tolerance; 15 min    Electrical Stimulation Goals  Pain;Tone      Traction   Type of Traction  Lumbar    Min (lbs)  65    Max (lbs)  80    Hold Time  60    Rest Time  20    Time  15      Manual Therapy   Manual Therapy  Soft tissue mobilization    Manual therapy comments  Sidelying     Soft tissue mobilization  R glute, R piriformis     Myofascial Release  TPR to R Piriformis along length of muscle (tender throughout); pt. noting good relief following this     Passive ROM  Manual R KTOS, modified piri, HS with stretch with therapist x 20 sec each                   PT Long Term Goals - 01/15/18 1507      PT LONG TERM GOAL #1   Title  patient to be independent with advanced HEP    Status  Partially Met met for current HEP       PT LONG TERM GOAL #2   Title  patient to improve lumbar AROM to WNL in all planes without pain limiting motion    Status   Partially Met      PT LONG TERM GOAL #3   Title  patient to demonstrate improved flexibility at R hip/buttock for improved pain    Status  On-going      PT LONG TERM GOAL #4   Title  patient to improve B hip strength to >/= 4+/5 without pain    Status  Achieved            Plan - 01/22/18 1713    Clinical Impression Statement  Patient arrived to session with report of persisting R buttock pain. Patient however continuing to be active at home-still walking 2 miles without rest break. Tolerated STM to R glute and piriformis- patient with palpable soft tissue restriction and tenderness in these areas. Encouraged patient to try tennis ball self-STM to these areas for continued pain relief. Educated patient on DN and its potential benefit- patient interested in possibly trying DN next visit. Patient tolerated R hip stretching and strengthening this date with good form and no c/o pain. Received e-stim to R buttock and lumbar traction at end of session per patient's request. Normal integumentary response and no c/o pain at end of session.     PT Treatment/Interventions  ADLs/Self Care Home Management;Cryotherapy;Electrical Stimulation;Moist Heat;Traction;Therapeutic exercise;Therapeutic activities;Functional mobility training;Ultrasound;Neuromuscular re-education;Patient/family education;Manual techniques;Vasopneumatic Device;Taping;Dry needling;Passive range of motion    PT Next Visit Plan  possibly DN next session    Consulted and Agree with Plan of Care  Patient       Patient will benefit from skilled therapeutic intervention in order to improve the following deficits and impairments:  Pain, Decreased range of motion, Decreased mobility, Decreased strength, Decreased activity tolerance  Visit Diagnosis: Chronic right-sided low back pain with right-sided sciatica  Pain in right hip  Other symptoms and signs involving the musculoskeletal system     Problem List Patient Active Problem  List   Diagnosis Date Noted  . Coronary artery disease due to lipid rich plaque 07/20/2014  . Hyperlipidemia 07/20/2013  .  HTN (hypertension) 07/20/2013  . Angina decubitus ([place]) 07/20/2013  . Old MI (myocardial infarction) 07/20/2013    Janene Harvey, PT, DPT 01/22/18 5:18 PM   [facility] [address]  [place], [place], [address] Phone: [phone]   Fax:  [phone]  Name: JARET COPPEDGE MRN: [id number] Date of Birth: [birth year]

## 2018-01-27 ENCOUNTER — Ambulatory Visit: Payer: PPO

## 2018-01-27 DIAGNOSIS — G8929 Other chronic pain: Secondary | ICD-10-CM

## 2018-01-27 DIAGNOSIS — M5441 Lumbago with sciatica, right side: Secondary | ICD-10-CM | POA: Diagnosis not present

## 2018-01-27 DIAGNOSIS — R29898 Other symptoms and signs involving the musculoskeletal system: Secondary | ICD-10-CM

## 2018-01-27 DIAGNOSIS — M25551 Pain in right hip: Secondary | ICD-10-CM

## 2018-01-27 NOTE — Therapy (Signed)
[facility] [address]  [place], [place], [address] Phone: [phone]   Fax:  [phone]  Physical Therapy Treatment  Patient Details  Name: Connor Hansen MRN: [id number] Date of Birth: [birth year] Referring Provider: Dr. Erline Levine   Encounter Date: 01/27/2018  PT End of Session - 01/27/18 1453    Visit Number  12    Number of Visits  16    Date for PT Re-Evaluation  02/25/18    Authorization Type  [employer]    PT Start Time  1446    PT Stop Time  1549    PT Time Calculation (min)  63 min    Activity Tolerance  Patient tolerated treatment well    Behavior During Therapy  [facility] for tasks assessed/performed       Past Medical History:  Diagnosis Date  . CAD (coronary artery disease)    2/10 - BMS to mid LAD, RCA. Anteroapical akinesis - normal EF. 2010  . CKD (chronic kidney disease)   . Coronary atherosclerosis of native coronary artery   . GERD (gastroesophageal reflux disease)   . HTN (hypertension)   . Hyperlipidemia   . Hypothyroidism   . Old myocardial infarct   . RA (rheumatoid arthritis) ([place])     Past Surgical History:  Procedure Laterality Date  . CHOLECYSTECTOMY    . HEMILAMINOTOMY LUMBAR SPINE      There were no vitals filed for this visit.  Subjective Assessment - 01/27/18 1451    Subjective  Pt. notes, "My Sciatica on my right side is acting up".      Pertinent History  CAD, CKD, HTN, RA, h/o MI    Patient Stated Goals  improve pain    Currently in Pain?  Yes    Pain Score  5     Pain Location  Buttocks    Pain Orientation  Right    Pain Descriptors / Indicators  -- "pressure"    Pain Type  Chronic pain    Pain Radiating Towards  Radiating pain into R LE     Pain Frequency  Intermittent    Aggravating Factors   Bending     Pain Relieving Factors  Heat; LE stretching    Multiple Pain Sites  No                       [facility] Adult PT Treatment/Exercise -  01/27/18 1446      Exercises   Exercises  Lumbar      Lumbar Exercises: Stretches   Active Hamstring Stretch  Right;30 seconds;2 reps    Active Hamstring Stretch Limitations  seated    Single Knee to Chest Stretch  Right;30 seconds;1 rep    Piriformis Stretch  Right;30 seconds;2 reps    Piriformis Stretch Limitations  KTOS      Lumbar Exercises: Aerobic   Recumbent Bike  L3 x 6 min      Lumbar Exercises: Supine   Bridge with clamshell  10 reps    Bridge with [employer] Limitations  + alt hip ABD/ER with red TB      Lumbar Exercises: Sidelying   Clam  Right;15 reps;3 seconds    Clam Limitations  red looped band at knees       Moist Heat Therapy   Number Minutes Moist Heat  15 Minutes    Moist Heat Location  Hip R hip/buttocks  Acupuncturist Location  R buttocks     Electrical Stimulation Action  IFC    Electrical Stimulation Parameters  80-150 Hz, intensity to pt tolerance x15'    Electrical Stimulation Goals  Pain;Tone      Manual Therapy   Manual Therapy  Soft tissue mobilization;Myofascial release;Passive ROM    Manual therapy comments  Sidelying     Soft tissue mobilization  R glutes, R piriformis     Myofascial Release  manual TPR to R R piriformsi, glute med & min    Passive ROM  Manual R KTOS, modified piri, HS with stretch with therapist x 20 sec each                   PT Long Term Goals - 01/15/18 1507      PT LONG TERM GOAL #1   Title  patient to be independent with advanced HEP    Status  Partially Met met for current HEP       PT LONG TERM GOAL #2   Title  patient to improve lumbar AROM to WNL in all planes without pain limiting motion    Status  Partially Met      PT LONG TERM GOAL #3   Title  patient to demonstrate improved flexibility at R hip/buttock for improved pain    Status  On-going      PT LONG TERM GOAL #4   Title  patient to improve B hip strength to >/= 4+/5 without pain    Status   Achieved            Plan - 01/27/18 1741    PT Treatment/Interventions  ADLs/Self Care Home Management;Cryotherapy;Electrical Stimulation;Moist Heat;Traction;Therapeutic exercise;Therapeutic activities;Functional mobility training;Ultrasound;Neuromuscular re-education;Patient/family education;Manual techniques;Vasopneumatic Device;Taping;Dry needling;Passive range of motion    Consulted and Agree with Plan of Care  Patient       Patient will benefit from skilled therapeutic intervention in order to improve the following deficits and impairments:  Pain, Decreased range of motion, Decreased mobility, Decreased strength, Decreased activity tolerance  Visit Diagnosis: Chronic right-sided low back pain with right-sided sciatica  Pain in right hip  Other symptoms and signs involving the musculoskeletal system     Problem List Patient Active Problem List   Diagnosis Date Noted  . Coronary artery disease due to lipid rich plaque 07/20/2014  . Hyperlipidemia 07/20/2013  . HTN (hypertension) 07/20/2013  . Angina decubitus ([place]) 07/20/2013  . Old MI (myocardial infarction) 07/20/2013    Percival Spanish, PT, MPT 01/27/2018, 5:50 PM  [facility] [address]  [place], [place], [address] Phone: [phone]   Fax:  [phone]  Name: Connor Hansen MRN: [id number] Date of Birth: 1934/06/25

## 2018-01-27 NOTE — Patient Instructions (Signed)

## 2018-02-03 ENCOUNTER — Ambulatory Visit: Payer: PPO

## 2018-02-03 DIAGNOSIS — H353231 Exudative age-related macular degeneration, bilateral, with active choroidal neovascularization: Secondary | ICD-10-CM | POA: Diagnosis not present

## 2018-02-03 DIAGNOSIS — H35433 Paving stone degeneration of retina, bilateral: Secondary | ICD-10-CM | POA: Diagnosis not present

## 2018-02-03 DIAGNOSIS — H35423 Microcystoid degeneration of retina, bilateral: Secondary | ICD-10-CM | POA: Diagnosis not present

## 2018-02-03 DIAGNOSIS — H33193 Other retinoschisis and retinal cysts, bilateral: Secondary | ICD-10-CM | POA: Diagnosis not present

## 2018-02-11 DIAGNOSIS — M545 Low back pain: Secondary | ICD-10-CM | POA: Diagnosis not present

## 2018-02-11 DIAGNOSIS — R030 Elevated blood-pressure reading, without diagnosis of hypertension: Secondary | ICD-10-CM | POA: Diagnosis not present

## 2018-02-11 DIAGNOSIS — M5416 Radiculopathy, lumbar region: Secondary | ICD-10-CM | POA: Diagnosis not present

## 2018-02-11 DIAGNOSIS — M47816 Spondylosis without myelopathy or radiculopathy, lumbar region: Secondary | ICD-10-CM | POA: Diagnosis not present

## 2018-02-13 ENCOUNTER — Ambulatory Visit: Payer: PPO

## 2018-02-18 ENCOUNTER — Encounter: Payer: PPO | Admitting: Physical Therapy

## 2018-02-19 ENCOUNTER — Ambulatory Visit: Payer: PPO | Attending: Neurosurgery

## 2018-02-19 ENCOUNTER — Encounter: Payer: Self-pay | Admitting: Cardiology

## 2018-02-19 DIAGNOSIS — G8929 Other chronic pain: Secondary | ICD-10-CM | POA: Diagnosis not present

## 2018-02-19 DIAGNOSIS — M5441 Lumbago with sciatica, right side: Secondary | ICD-10-CM | POA: Insufficient documentation

## 2018-02-19 DIAGNOSIS — M25551 Pain in right hip: Secondary | ICD-10-CM | POA: Diagnosis not present

## 2018-02-19 DIAGNOSIS — R29898 Other symptoms and signs involving the musculoskeletal system: Secondary | ICD-10-CM | POA: Insufficient documentation

## 2018-02-19 NOTE — Therapy (Signed)
[facility] [address]  [place], [place], [address] Phone: [phone]   Fax:  [phone]  Physical Therapy Treatment  Patient Details  Name: Connor Hansen MRN: [id number] Date of Birth: [birth year] Referring Provider: Dr. Erline Levine   Encounter Date: 02/19/2018  PT End of Session - 02/19/18 1417    Visit Number  13    Number of Visits  25    Date for PT Re-Evaluation  04/02/18    Authorization Type  [employer]    PT Start Time  1355    PT Stop Time  1500    PT Time Calculation (min)  65 min    Activity Tolerance  Patient tolerated treatment well    Behavior During Therapy  [facility] for tasks assessed/performed       Past Medical History:  Diagnosis Date  . CAD (coronary artery disease)    2/10 - BMS to mid LAD, RCA. Anteroapical akinesis - normal EF. 2010  . CKD (chronic kidney disease)   . Coronary atherosclerosis of native coronary artery   . GERD (gastroesophageal reflux disease)   . HTN (hypertension)   . Hyperlipidemia   . Hypothyroidism   . Old myocardial infarct   . RA (rheumatoid arthritis) ([place])     Past Surgical History:  Procedure Laterality Date  . CHOLECYSTECTOMY    . HEMILAMINOTOMY LUMBAR SPINE      There were no vitals filed for this visit.  Subjective Assessment - 02/19/18 1400    Subjective  Pt. noting,  "My doctor has me and Benjamine Mola lined up for an epidural injection in August".      Pertinent History  CAD, CKD, HTN, RA, h/o MI    How long can you sit comfortably?  20 min     How long can you stand comfortably?  1 hour     How long can you walk comfortably?  45 min     Diagnostic tests  nothing recent    Patient Stated Goals  improve pain    Currently in Pain?  Yes    Pain Score  3     Pain Location  Buttocks    Pain Orientation  Right;Left    Pain Descriptors / Indicators  -- "Pressure"    Pain Type  Chronic pain    Pain Radiating Towards  radiating into R LE    Pain  Onset  1 to 4 weeks ago    Pain Frequency  Intermittent    Pain Relieving Factors  heat; LE stretching     Multiple Pain Sites  No         [facility] PT Assessment - 02/19/18 1428      AROM   AROM Assessment Site  Lumbar    Lumbar Flexion  fingertip to mid shins - stretching in leg    Lumbar Extension  25% limited - some pain free    Lumbar - Right Side Bend  25% limited - some pain      Lumbar - Left Side Bend  25% limited - pain free    Lumbar - Right Rotation  WNL - pain free    Lumbar - Left Rotation  WNL  - pain freee      Strength   Strength Assessment Site  Hip;Ankle    Right/Left Hip  Right;Left    Right Hip Flexion  4+/5    Right Hip ABduction  4+/5    Left Hip  Flexion  4+/5    Left Hip ABduction  4+/5    Right/Left Knee  Right;Left    Right Knee Flexion  5/5    Right Knee Extension  5/5    Left Knee Flexion  5/5    Left Knee Extension  5/5                   [facility] Adult PT Treatment/Exercise - 02/19/18 1422      Lumbar Exercises: Stretches   Passive Hamstring Stretch  Right;2 reps;20 seconds    Passive Hamstring Stretch Limitations  seated     Single Knee to Chest Stretch  Right;Left;1 rep;30 seconds    Lower Trunk Rotation  5 reps;10 seconds    Piriformis Stretch  Right;30 seconds;2 reps Notes good relief from this     Piriformis Stretch Limitations  KTOS      Lumbar Exercises: Aerobic   Recumbent Bike  L3 x 7 min      Lumbar Exercises: Standing   Functional Squats  15 reps;3 seconds    Functional Squats Limitations  counter - cues to contract glutes with motion       Lumbar Exercises: Supine   Bridge  15 reps;5 seconds VC's for proper ROM       Lumbar Exercises: Sidelying   Clam  Right;Left;10 reps;3 seconds less resistance due to pt. noting pain on red TB     Clam Limitations  yellow looped TB       Electrical Stimulation   Electrical Stimulation Location  lower back     Electrical Stimulation Action  IFC    Electrical Stimulation  Parameters  to tolerance, 15'     Electrical Stimulation Goals  Pain;Tone      Manual Therapy   Manual Therapy  Soft tissue mobilization;Myofascial release;Passive ROM    Manual therapy comments  Sidelying     Soft tissue mobilization  R medial glutes, R piriformis, B lumbar paraspinals     Myofascial Release  Manual TPR to R piriformsi, medial glute              PT Education - 02/19/18 1809    Education provided  Yes    Education Details  HEP update:  Only with activities that pt. has tolerated well or reported relief from in 2+ visits     Person(s) Educated  Patient    Methods  Explanation;Demonstration;Verbal cues;Handout    Comprehension  Verbalized understanding;Returned demonstration;Verbal cues required;Need further instruction          PT Long Term Goals - 02/19/18 1427      PT LONG TERM GOAL #1   Title  patient to be independent with advanced HEP    Status  Partially Met met for current HEP     Target Date  04/02/18      PT LONG TERM GOAL #2   Title  patient to improve lumbar AROM to WNL in all planes without pain limiting motion    Status  Partially Met Still limited lumbar extension and flexoin     Target Date  04/02/18      PT LONG TERM GOAL #3   Title  patient to demonstrate improved flexibility at R hip/buttock for improved pain    Status  On-going    Target Date  04/02/18      PT LONG TERM GOAL #4   Title  patient to improve B hip strength to >/= 4+/5 without pain    Status  Achieved    Target Date  04/02/18            Plan - 02/19/18 1418    Clinical Impression Statement  [name] seen to start session reporting "my sciatica has been flaring up on both sides".  Reports reason for recent absence from therapy was due to [facility] unit breaking and being out for two weeks at home and needing to alter living arrangements.  Notes he feels that he has a 50% improvement in overall pain levels since starting therapy and felt nearly 75% improvement before recent  flare up of "sciatic pain".  Pt. able to achieve LE strength goal, and now only limited in lumbar ROM flexion, extension.  Still demonstrating limited flexibility in B buttocks and hip musculature likely contributing to ongoing symptoms/pain in hips.  Pt. reporting his primary goal in therapy is to return to golf at this point.  Notes he is tolerating previously issued HEP activities, which he had poor tolerance for better now.  Updated HEP with activities pt. can tolerate well to continue to focus on remaining deficits.  Pt. requesting to continue with therapy focusing on improving walking tolerance, comfort with transitional sit<>stand motions, and return to golf.  Pt. will continue to benefit from further skilled therapy to improve tolerance for daily activities and return to leisure activities.      PT Treatment/Interventions  ADLs/Self Care Home Management;Cryotherapy;Electrical Stimulation;Moist Heat;Traction;Therapeutic exercise;Therapeutic activities;Functional mobility training;Ultrasound;Neuromuscular re-education;Patient/family education;Manual techniques;Vasopneumatic Device;Taping;Dry needling;Passive range of motion    Consulted and Agree with Plan of Care  Patient       Patient will benefit from skilled therapeutic intervention in order to improve the following deficits and impairments:  Pain, Decreased range of motion, Decreased mobility, Decreased strength, Decreased activity tolerance  Visit Diagnosis: Chronic right-sided low back pain with right-sided sciatica  Pain in right hip  Other symptoms and signs involving the musculoskeletal system     Problem List Patient Active Problem List   Diagnosis Date Noted  . Coronary artery disease due to lipid rich plaque 07/20/2014  . Hyperlipidemia 07/20/2013  . HTN (hypertension) 07/20/2013  . Angina decubitus ([place]) 07/20/2013  . Old MI (myocardial infarction) 07/20/2013    Bess Harvest, PTA 02/19/18 7:01 PM   [facility] [address]  [place], [place], [address] Phone: [phone]   Fax:  [phone]  Name: JANCARLOS THRUN MRN: [id number] Date of Birth: January 23, 1934   Patient reports 50% improvement in LBP since initial eval due to recent flare up, however citing it was 75% before this flare. Reports difficulty with prolonged walking tolerance, decreased tolerance of STS, and patient's ultimate goal of returning to golf. Would benefit from continued skilled PT services 2x/week for 6 weeks to address pain and aforementioned functional impairments.   Janene Harvey, PT, DPT 02/19/18 7:03 PM

## 2018-02-26 ENCOUNTER — Ambulatory Visit: Payer: PPO

## 2018-02-26 DIAGNOSIS — G8929 Other chronic pain: Secondary | ICD-10-CM

## 2018-02-26 DIAGNOSIS — M25551 Pain in right hip: Secondary | ICD-10-CM

## 2018-02-26 DIAGNOSIS — M5441 Lumbago with sciatica, right side: Secondary | ICD-10-CM | POA: Diagnosis not present

## 2018-02-26 DIAGNOSIS — R29898 Other symptoms and signs involving the musculoskeletal system: Secondary | ICD-10-CM

## 2018-02-26 NOTE — Therapy (Signed)
[facility] [address]  [place], [place], [address] Phone: [phone]   Fax:  [phone]  Physical Therapy Treatment  Patient Details  Name: Connor Hansen MRN: [id number] Date of Birth: 11-12-1933 Referring Provider: Dr. Erline Levine   Encounter Date: 02/26/2018  PT End of Session - 02/26/18 1407    Visit Number  14    Number of Visits  25    Date for PT Re-Evaluation  04/02/18    Authorization Type  [employer]    PT Start Time  1401    PT Stop Time  1500    PT Time Calculation (min)  59 min    Activity Tolerance  Patient tolerated treatment well    Behavior During Therapy  [facility] for tasks assessed/performed       Past Medical History:  Diagnosis Date  . CAD (coronary artery disease)    2/10 - BMS to mid LAD, RCA. Anteroapical akinesis - normal EF. 2010  . CKD (chronic kidney disease)   . Coronary atherosclerosis of native coronary artery   . GERD (gastroesophageal reflux disease)   . HTN (hypertension)   . Hyperlipidemia   . Hypothyroidism   . Old myocardial infarct   . RA (rheumatoid arthritis) ([place])     Past Surgical History:  Procedure Laterality Date  . CHOLECYSTECTOMY    . HEMILAMINOTOMY LUMBAR SPINE      There were no vitals filed for this visit.  Subjective Assessment - 02/26/18 1403    Subjective  Pt. reporting he has been walking everyday ~ 2 miles before lunch.      Pertinent History  CAD, CKD, HTN, RA, h/o MI    Diagnostic tests  nothing recent    Patient Stated Goals  improve pain    Currently in Pain?  Yes    Pain Score  3     Pain Location  Buttocks    Pain Orientation  Right    Pain Descriptors / Indicators  -- "pressure"    Pain Type  Chronic pain    Pain Radiating Towards  Radiating into R lateral thigh     Pain Onset  More than a month ago    Pain Frequency  Intermittent    Aggravating Factors   Bending     Multiple Pain Sites  No                        [facility] Adult PT Treatment/Exercise - 02/26/18 1421      Lumbar Exercises: Aerobic   Nustep  Lvl 6, 6 min       Lumbar Exercises: Standing   Heel Raises  3 seconds;10 reps    Heel Raises Limitations  R eccentric     Functional Squats  15 reps;3 seconds red TB at knees     Functional Squats Limitations  counter     Wall Slides  10 reps;3 seconds    Wall Slides Limitations  leaning on orange p-ball on wall     Other Standing Lumbar Exercises  Sidestepping with red TB; 5x length of counter top      Lumbar Exercises: Seated   Other Seated Lumbar Exercises  Seated fitter hip extension (1 blue, 1 black band) x 20 reps       Lumbar Exercises: Supine   Ab Set  10 reps;3 seconds    Isometric Hip Flexion  15 reps;5 seconds    Isometric  Hip Flexion Limitations  heels on peanut p-ball     Other Supine Lumbar Exercises  sciatic nerve glide with; 20x R LE      Lumbar Exercises: Sidelying   Clam  Right;Left;3 seconds;15 reps    Clam Limitations  red TB      Moist Heat Therapy   Number Minutes Moist Heat  15 Minutes    Moist Heat Location  Hip R hip/buttocks       Electrical Stimulation   Electrical Stimulation Location  R buttocks     Electrical Stimulation Action  IFC    Electrical Stimulation Parameters  to tolerance, 15'    Electrical Stimulation Goals  Pain;Tone                  PT Long Term Goals - 02/19/18 1427      PT LONG TERM GOAL #1   Title  patient to be independent with advanced HEP    Status  Partially Met met for current HEP     Target Date  04/02/18      PT LONG TERM GOAL #2   Title  patient to improve lumbar AROM to WNL in all planes without pain limiting motion    Status  Partially Met Still limited lumbar extension and flexoin     Target Date  04/02/18      PT LONG TERM GOAL #3   Title  patient to demonstrate improved flexibility at [place] for improved pain    Status  On-going    Target Date  04/02/18       PT LONG TERM GOAL #4   Title  patient to improve B hip strength to >/= 4+/5 without pain    Status  Achieved    Target Date  04/02/18            Plan - 02/26/18 1409    Clinical Impression Statement  [name] seen today noting improvement with therapy however, primary complaint was, "My right sciatica is still bothering me today.  Don tolerated increased focus on LE strengthening and functional strengthening activities well today and did not request manual therapy.  Ended session with moist heat/E-stim to R buttocks per pt. request to decrease tone and pain.  Will continue to progress toward goals.      PT Treatment/Interventions  ADLs/Self Care Home Management;Cryotherapy;Electrical Stimulation;Moist Heat;Traction;Therapeutic exercise;Therapeutic activities;Functional mobility training;Ultrasound;Neuromuscular re-education;Patient/family education;Manual techniques;Vasopneumatic Device;Taping;Dry needling;Passive range of motion    Consulted and Agree with Plan of Care  Patient       Patient will benefit from skilled therapeutic intervention in order to improve the following deficits and impairments:  Pain, Decreased range of motion, Decreased mobility, Decreased strength, Decreased activity tolerance  Visit Diagnosis: Chronic right-sided low back pain with right-sided sciatica  Pain in right hip  Other symptoms and signs involving the musculoskeletal system     Problem List Patient Active Problem List   Diagnosis Date Noted  . Coronary artery disease due to lipid rich plaque 07/20/2014  . Hyperlipidemia 07/20/2013  . HTN (hypertension) 07/20/2013  . Angina decubitus ([place]) 07/20/2013  . Old MI (myocardial infarction) 07/20/2013    Bess Harvest, PTA 02/26/18 6:45 PM   [facility] [address]  [place], [place], [address] Phone: [phone]   Fax:  [phone]  Name: UMBERTO PAVEK MRN: [id number] Date of  Birth: January 29, 1934

## 2018-03-03 ENCOUNTER — Ambulatory Visit: Payer: PPO | Admitting: Physical Therapy

## 2018-03-06 ENCOUNTER — Ambulatory Visit: Payer: PPO

## 2018-03-06 DIAGNOSIS — G8929 Other chronic pain: Secondary | ICD-10-CM

## 2018-03-06 DIAGNOSIS — M25551 Pain in right hip: Secondary | ICD-10-CM

## 2018-03-06 DIAGNOSIS — M5441 Lumbago with sciatica, right side: Principal | ICD-10-CM

## 2018-03-06 DIAGNOSIS — R29898 Other symptoms and signs involving the musculoskeletal system: Secondary | ICD-10-CM

## 2018-03-06 NOTE — Patient Instructions (Signed)
TENS stands for Transcutaneous Electrical Nerve Stimulation. In other words, electrical impulses are allowed to pass through the skin in order to excite a nerve.   Purpose and Use of TENS:  TENS is a method used to manage acute and chronic pain without the use of drugs. It has been effective in managing pain associated with surgery, sprains, strains, trauma, rheumatoid arthritis, and neuralgias. It is a non-addictive, low risk, and non-invasive technique used to control pain. It is not, by any means, a curative form of treatment.   How TENS Works:  Most TENS units are a small pocket-sized unit powered by one 9 volt battery. Attached to the outside of the unit are two lead wires where two pins and/or snaps connect on each wire. All units come with a set of four reusable pads or electrodes. These are placed on the skin surrounding the area involved. By inserting the leads into  the pads, the electricity can pass from the unit making the circuit complete.  As the intensity is turned up slowly, the electrical current enters the body from the electrodes through the skin to the surrounding nerve fibers. This triggers the release of hormones from within the body. These hormones contain pain relievers. By increasing the circulation of these hormones, the person's pain may be lessened. It is also believed that the electrical stimulation itself helps to block the pain messages being sent to the brain, thus also decreasing the body's perception of pain.   Hazards:  TENS units are NOT to be used by patients with PACEMAKERS, DEFIBRILLATORS, DIABETIC PUMPS, PREGNANT WOMEN, and patients with SEIZURE DISORDERS.  TENS units are NOT to be used over the heart, throat, brain, or spinal cord.  One of the major side effects from the TENS unit may be skin irritation. Some people may develop a rash if they are sensitive to the materials used in the electrodes or the connecting wires.   Wear the unit for 15'.   Avoid overuse  due the body getting used to the stem making it not as effective over time.   TENS UNIT  This is helpful for muscle pain and spasm.   Search and Purchase a TENS 7000 2nd edition at www.tenspros.com or www.amazon.com  (It should be less than $30)     TENS unit instructions:   Do not shower or bathe with the unit on  Turn the unit off before removing electrodes or batteries  If the electrodes lose stickiness add a drop of water to the electrodes after they are disconnected from the unit and place on plastic sheet. If you continued to have difficulty, call the TENS unit company to purchase more electrodes.  Do not apply lotion on the skin area prior to use. Make sure the skin is clean and dry as this will help prolong the life of the electrodes.  After use, always check skin for unusual red areas, rash or other skin difficulties. If there are any skin problems, does not apply electrodes to the same area.  Never remove the electrodes from the unit by pulling the wires.  Do not use the TENS unit or electrodes other than as directed.  Do not change electrode placement without consulting your therapist or physician.  Keep 2 fingers with between each electrode.  

## 2018-03-06 NOTE — Therapy (Signed)
[facility] [address]  [place], [place], [address] Phone: [phone]   Fax:  [phone]  Physical Therapy Treatment  Patient Details  Name: THORNTON DOHRMANN MRN: [id number] Date of Birth: October 08, 1933 Referring Provider: Dr. Erline Levine   Encounter Date: 03/06/2018  PT End of Session - 03/06/18 0939    Visit Number  15    Number of Visits  25    Date for PT Re-Evaluation  04/02/18    Authorization Type  [employer]    PT Start Time  0930    PT Stop Time  1025    PT Time Calculation (min)  55 min    Activity Tolerance  Patient tolerated treatment well    Behavior During Therapy  [facility] for tasks assessed/performed       Past Medical History:  Diagnosis Date  . CAD (coronary artery disease)    2/10 - BMS to mid LAD, RCA. Anteroapical akinesis - normal EF. 2010  . CKD (chronic kidney disease)   . Coronary atherosclerosis of native coronary artery   . GERD (gastroesophageal reflux disease)   . HTN (hypertension)   . Hyperlipidemia   . Hypothyroidism   . Old myocardial infarct   . RA (rheumatoid arthritis) ([place])     Past Surgical History:  Procedure Laterality Date  . CHOLECYSTECTOMY    . HEMILAMINOTOMY LUMBAR SPINE      There were no vitals filed for this visit.  Subjective Assessment - 03/06/18 0934    Subjective  Notesd 75% improvement in overall pain levels since attending therapy     Pertinent History  CAD, CKD, HTN, RA, h/o MI    Patient Stated Goals  improve pain    Currently in Pain?  Yes    Pain Score  1     Pain Location  -- upper posterior thigh     Pain Orientation  Right;Posterior;Proximal    Pain Descriptors / Indicators  Aching    Pain Type  Chronic pain    Pain Radiating Towards  none today    Pain Onset  More than a month ago    Pain Frequency  Intermittent                       [facility] Adult PT Treatment/Exercise - 03/06/18 0946      Lumbar Exercises: Stretches    Lower Trunk Rotation  5 reps;10 seconds      Lumbar Exercises: Aerobic   Tread Mill  2.58mh, 5 min       Lumbar Exercises: Machines for Strengthening   Other Lumbar Machine Exercise  BATCA low row 20# x 15 reps; focusing on abdominal bracing and upright posture       Lumbar Exercises: Standing   Functional Squats  15 reps;3 seconds    Functional Squats Limitations  TRX    Row  Both;10 reps    Theraband Level (Row)  Level 3 (Green)    Other Standing Lumbar Exercises  B pallof press with green TB x 15 reps       Knee/Hip Exercises: Standing   Hip Flexion  Right;Left;10 reps;Stengthening;Knee straight    Hip Flexion Limitations  at TM;     Hip Abduction  Right;Left;10 reps;Knee straight    Abduction Limitations  at TM; cues for abdom. bracing     Hip Extension  Right;Left;10 reps;Knee straight    Extension Limitations  45 dg kickback; at TM;  cues for abdom. bracing       Moist Heat Therapy   Number Minutes Moist Heat  10 Minutes    Moist Heat Location  Lumbar Spine      Electrical Stimulation   Electrical Stimulation Location  lumbar spine     Electrical Stimulation Action  IFC    Electrical Stimulation Parameters  to tolerance, 10'    Electrical Stimulation Goals  Pain;Tone      Manual Therapy   Manual Therapy  Soft tissue mobilization;Myofascial release    Manual therapy comments  prone     Soft tissue mobilization  R proximal HS STM     Myofascial Release  TPR to R proximal HS     Passive ROM  manual R HS, glute stretch with therapist x 30 sec each way                   PT Long Term Goals - 02/19/18 1427      PT LONG TERM GOAL #1   Title  patient to be independent with advanced HEP    Status  Partially Met met for current HEP     Target Date  04/02/18      PT LONG TERM GOAL #2   Title  patient to improve lumbar AROM to WNL in all planes without pain limiting motion    Status  Partially Met Still limited lumbar extension and flexoin     Target Date   04/02/18      PT LONG TERM GOAL #3   Title  patient to demonstrate improved flexibility at R hip/buttock for improved pain    Status  On-going    Target Date  04/02/18      PT LONG TERM GOAL #4   Title  patient to improve B hip strength to >/= 4+/5 without pain    Status  Achieved    Target Date  04/02/18            Plan - 03/06/18 0948    Clinical Impression Statement  Pt. reporting reduction in buttocks pain since last visit however onset of R posterior/upper thigh pain over last few days.  Pt. with mild tenderness at proximal HS today, which was addressed with manual therapy with good relief and pt. able to progress therex for duration of visit without report of pain.  Pt. noting he is now able to perform LTR, and open book stretch, bridge, and clam shell activities at home without issue.  Feels he is getting his lumbar motion back.  Tolerated progression of lumbopelvic/LE strengthening activities well today without pain.  Ended session with E-stim/moist heat to lumbar spine per pt. request to further reduce pain and tone in lower back.  HEP updated with 3-way hip kicker.  Education for home TENS unit issued to pt. today with pt. verbalizing understanding.  Will also check for coverage for home TENS/EMSI unit and will monitor response in coming visits as pt. to be contacted by TENS rep.      PT Treatment/Interventions  ADLs/Self Care Home Management;Cryotherapy;Electrical Stimulation;Moist Heat;Traction;Therapeutic exercise;Therapeutic activities;Functional mobility training;Ultrasound;Neuromuscular re-education;Patient/family education;Manual techniques;Vasopneumatic Device;Taping;Dry needling;Passive range of motion    Consulted and Agree with Plan of Care  Patient       Patient will benefit from skilled therapeutic intervention in order to improve the following deficits and impairments:  Pain, Decreased range of motion, Decreased mobility, Decreased strength, Decreased activity  tolerance  Visit Diagnosis: Chronic right-sided low back pain with right-sided sciatica  Pain in right hip  Other symptoms and signs involving the musculoskeletal system     Problem List Patient Active Problem List   Diagnosis Date Noted  . Coronary artery disease due to lipid rich plaque 07/20/2014  . Hyperlipidemia 07/20/2013  . HTN (hypertension) 07/20/2013  . Angina decubitus ([place]) 07/20/2013  . Old MI (myocardial infarction) 07/20/2013    Bess Harvest, PTA 03/06/18 10:50 AM   [facility] [address]  [place], [place], [address] Phone: [phone]   Fax:  [phone]  Name: EMONTE DIEUJUSTE MRN: [id number] Date of Birth: July 22, 1934

## 2018-03-10 ENCOUNTER — Ambulatory Visit: Payer: PPO | Admitting: Cardiology

## 2018-03-10 ENCOUNTER — Ambulatory Visit: Payer: PPO

## 2018-03-10 DIAGNOSIS — M5441 Lumbago with sciatica, right side: Secondary | ICD-10-CM | POA: Diagnosis not present

## 2018-03-10 DIAGNOSIS — M25551 Pain in right hip: Secondary | ICD-10-CM

## 2018-03-10 DIAGNOSIS — G8929 Other chronic pain: Secondary | ICD-10-CM

## 2018-03-10 DIAGNOSIS — R29898 Other symptoms and signs involving the musculoskeletal system: Secondary | ICD-10-CM

## 2018-03-10 NOTE — Therapy (Addendum)
[facility] [address]  [place], [place], [address] Phone: [phone]   Fax:  [phone]  Physical Therapy Treatment  Patient Details  Name: Connor Hansen MRN: [id number] Date of Birth: 06-16-1934 Referring Provider: Dr. Erline Levine   Encounter Date: 03/10/2018  PT End of Session - 03/10/18 1554    Visit Number  16    Number of Visits  25    Date for PT Re-Evaluation  04/02/18    Authorization Type  [employer]    PT Start Time  1533    PT Stop Time  1615    PT Time Calculation (min)  42 min    Activity Tolerance  Patient tolerated treatment well    Behavior During Therapy  [facility] for tasks assessed/performed       Past Medical History:  Diagnosis Date  . CAD (coronary artery disease)    2/10 - BMS to mid LAD, RCA. Anteroapical akinesis - normal EF. 2010  . CKD (chronic kidney disease)   . Coronary atherosclerosis of native coronary artery   . GERD (gastroesophageal reflux disease)   . HTN (hypertension)   . Hyperlipidemia   . Hypothyroidism   . Old myocardial infarct   . RA (rheumatoid arthritis) ([place])     Past Surgical History:  Procedure Laterality Date  . CHOLECYSTECTOMY    . HEMILAMINOTOMY LUMBAR SPINE      There were no vitals filed for this visit.  Subjective Assessment - 03/10/18 1540    Subjective  Connor Hansen noting increased R-sided thoracic back pain in mornings which is his primary complaint today.      Pertinent History  CAD, CKD, HTN, RA, h/o MI    How long can you sit comfortably?  30 min     How long can you stand comfortably?  1 hour     How long can you walk comfortably?  1 hours     Diagnostic tests  nothing recent    Patient Stated Goals  improve pain    Currently in Pain?  Yes    Pain Score  1  3/10 at worst     Pain Location  Hip    Pain Orientation  Right    Pain Descriptors / Indicators  Aching    Pain Type  Chronic pain    Pain Onset  More than a month ago    Pain  Frequency  Intermittent    Aggravating Factors   proloned sitting     Pain Relieving Factors  heat, KTOS    Multiple Pain Sites  Yes    Pain Score  0 5/10 in mornings     Pain Location  Scapula    Pain Orientation  Right    Pain Descriptors / Indicators  Aching    Pain Type  Chronic pain    Pain Onset  More than a month ago    Pain Frequency  Intermittent    Aggravating Factors   mornings, started [place]     Pain Relieving Factors  unsure                        [facility] Adult PT Treatment/Exercise - 03/10/18 1555      Lumbar Exercises: Stretches   Lower Trunk Rotation  5 reps;10 seconds      Lumbar Exercises: Aerobic   Recumbent Bike  L3 x 4 min  Nustep         Lumbar Exercises: Standing   Wall Slides  15 reps;3 seconds    Wall Slides Limitations  leaning on orange p-ball     Row  15 reps    Theraband Level (Row)  Level 3 (Green)    Shoulder Extension  Both;Strengthening;15 reps    Theraband Level (Shoulder Extension)  Level 1 (Yellow)    Other Standing Lumbar Exercises  B pallof press with double green TB x 15 reps       Lumbar Exercises: Quadruped   Single Arm Raise  10 reps;Right;Left    Single Arm Raises Limitations  peanut p-ball support     Straight Leg Raise  10 reps    Straight Leg Raises Limitations  peanut p-ball support      Manual Therapy   Manual Therapy  Soft tissue mobilization    Manual therapy comments  seated     Soft tissue mobilization  STM to R-sided thoracic and medial scap. area              PT Education - 03/11/18 1223    Education provided  Yes    Education Details  HEP update     Person(s) Educated  Patient    Methods  Explanation;Demonstration;Verbal cues;Handout    Comprehension  Verbalized understanding;Returned demonstration;Need further instruction;Verbal cues required          PT Long Term Goals - 02/19/18 1427      PT LONG TERM GOAL #1   Title  patient to be  independent with advanced HEP    Status  Partially Met met for current HEP     Target Date  04/02/18      PT LONG TERM GOAL #2   Title  patient to improve lumbar AROM to WNL in all planes without pain limiting motion    Status  Partially Met Still limited lumbar extension and flexoin     Target Date  04/02/18      PT LONG TERM GOAL #3   Title  patient to demonstrate improved flexibility at R hip/buttock for improved pain    Status  On-going    Target Date  04/02/18      PT LONG TERM GOAL #4   Title  patient to improve B hip strength to >/= 4+/5 without pain    Status  Achieved    Target Date  04/02/18            Plan - 03/10/18 1555    Clinical Impression Statement  Connor Hansen reporting some R-sided thoracic back pain, which is primary complaint today with secondary concern being, "Right sided sciatica".  Did present with some increased muscular tone in R mid trap/medial scapula area, which responded well to STM.  Tolerated gentle stretching and scapular strengthening well today.  Ended session with LE and proximal hip strengthening activities, which were well tolerated.  Connor Hansen progressing well with LE strengthening with improved tolerance.  Will attempt to hit a bucket of golf balls later this week.    PT Treatment/Interventions  ADLs/Self Care Home Management;Cryotherapy;Electrical Stimulation;Moist Heat;Traction;Therapeutic exercise;Therapeutic activities;Functional mobility training;Ultrasound;Neuromuscular re-education;Patient/family education;Manual techniques;Vasopneumatic Device;Taping;Dry needling;Passive range of motion    Consulted and Agree with Plan of Care  Patient       Patient will benefit from skilled therapeutic intervention in order to improve the following deficits and impairments:  Pain, Decreased range of motion, Decreased mobility, Decreased strength, Decreased activity tolerance  Visit Diagnosis: Chronic right-sided low back  pain with right-sided sciatica  Pain in  right hip  Other symptoms and signs involving the musculoskeletal system     Problem List Patient Active Problem List   Diagnosis Date Noted  . Coronary artery disease due to lipid rich plaque 07/20/2014  . Hyperlipidemia 07/20/2013  . HTN (hypertension) 07/20/2013  . Angina decubitus ([place]) 07/20/2013  . Old MI (myocardial infarction) 07/20/2013    Bess Harvest, PTA 03/11/18 12:23 PM   [facility] [address]  [place], [place], [address] Phone: [phone]   Fax:  [phone]  Name: Connor Hansen MRN: [id number] Date of Birth: [birth year]

## 2018-03-20 ENCOUNTER — Ambulatory Visit: Payer: PPO | Attending: Neurosurgery

## 2018-03-20 DIAGNOSIS — G8929 Other chronic pain: Secondary | ICD-10-CM | POA: Diagnosis not present

## 2018-03-20 DIAGNOSIS — M25551 Pain in right hip: Secondary | ICD-10-CM

## 2018-03-20 DIAGNOSIS — R29898 Other symptoms and signs involving the musculoskeletal system: Secondary | ICD-10-CM

## 2018-03-20 DIAGNOSIS — M5441 Lumbago with sciatica, right side: Secondary | ICD-10-CM | POA: Insufficient documentation

## 2018-03-20 NOTE — Therapy (Addendum)
[facility] [address]  [place], [place], [address] Phone: [phone]   Fax:  [phone]  Physical Therapy Treatment  Patient Details  Name: ALANO BLASCO MRN: [id number] Date of Birth: [birth year] Referring Provider: Dr. Erline Levine   Progress Note Reporting Period 01/22/18 to 03/20/18  See note below for Objective Data and Assessment of Progress/Goals.    Encounter Date: 03/20/2018  PT End of Session - 03/20/18 0901    Visit Number  17    Number of Visits  25    Date for PT Re-Evaluation  04/02/18    Authorization Type  [employer]    PT Start Time  0846    PT Stop Time  0930    PT Time Calculation (min)  44 min    Activity Tolerance  Patient tolerated treatment well    Behavior During Therapy  [facility] for tasks assessed/performed       Past Medical History:  Diagnosis Date  . CAD (coronary artery disease)    2/10 - BMS to mid LAD, RCA. Anteroapical akinesis - normal EF. 2010  . CKD (chronic kidney disease)   . Coronary atherosclerosis of native coronary artery   . GERD (gastroesophageal reflux disease)   . HTN (hypertension)   . Hyperlipidemia   . Hypothyroidism   . Old myocardial infarct   . RA (rheumatoid arthritis) ([place])     Past Surgical History:  Procedure Laterality Date  . CHOLECYSTECTOMY    . HEMILAMINOTOMY LUMBAR SPINE      There were no vitals filed for this visit.  Subjective Assessment - 03/20/18 0908    Subjective  Pt. noting upcoming epidural injection on Tuesday of next week and ongoing R "sciatica" sypmtoms.      Patient Stated Goals  improve pain    Currently in Pain?  Yes    Pain Location  Hip    Pain Orientation  Right    Pain Descriptors / Indicators  Aching    Pain Type  Chronic pain    Pain Radiating Towards  radiating pain down R LE with prolonged sitting in car     Pain Frequency  Intermittent    Aggravating Factors   Prolonged sitting     Pain Relieving  Factors  heat, clam shell, KTOS         [facility] PT Assessment - 03/20/18 0913      Observation/Other Assessments   Focus on Therapeutic Outcomes (FOTO)   62% (38% limitation)      AROM   Lumbar Flexion  fingertip to mid shins - stretching in leg    Lumbar Extension  25% limited - some pain free    Lumbar - Right Side Bend  WNL - pain free    Lumbar - Left Side Bend  WNL - pain free    Lumbar - Right Rotation  WNL - pain free    Lumbar - Left Rotation  WNL  - pain freee      Strength   Right/Left Hip  Right;Left    Right Hip Flexion  4+/5    Right Hip ABduction  4+/5    Left Hip Flexion  4+/5    Left Hip ABduction  4+/5    Right/Left Knee  Right;Left    Right Knee Flexion  5/5    Right Knee Extension  5/5    Left Knee Flexion  5/5    Left Knee Extension  5/5  Flexibility   Piriformis  Improved from intitial however remains tight B   pt. now with symmetrical piriformis length with passive stre                  [facility] Adult PT Treatment/Exercise - 03/20/18 0900      Self-Care   Self-Care  Other Self-Care Comments    Other Self-Care Comments   Discussed possibility of DN to R buttocks/piriformis in area of tenderness with pt. declining this; dicussed possible 30-day hold with PT and therapist with pt. agreeing to this pending upcoming epidural injection       Lumbar Exercises: Aerobic   Recumbent Bike  L3 x 7 min      Lumbar Exercises: Seated   Sit to Stand  --   x 12 reps; green TB at knees with sustained hip abd/ER      Lumbar Exercises: Supine   Clam  10 reps;3 seconds    Clam Limitations  Hooklying with green TB at knees x 10 rpes     Other Supine Lumbar Exercises  sciatic nerve glide with; 20x R LE      Manual Therapy   Manual Therapy  Soft tissue mobilization;Myofascial release    Manual therapy comments  sidelying     Soft tissue mobilization  STM to R-sided paraspinals, STM to R piriformis in area of ttp     Myofascial Release  TPR to R  piriformis with some relief noted following this     Passive ROM  manual B glute stretch x 30 sec each                   PT Long Term Goals - 03/20/18 0902      PT LONG TERM GOAL #1   Title  patient to be independent with advanced HEP    Status  Partially Met   met for current HEP      PT LONG TERM GOAL #2   Title  patient to improve lumbar AROM to WNL in all planes without pain limiting motion    Status  Partially Met   Still limited lumbar extension and flexoin      PT LONG TERM GOAL #3   Title  patient to demonstrate improved flexibility at R hip/buttock for improved pain    Status  Partially Met   Improved R flexibility however remains B min tight     PT LONG TERM GOAL #4   Title  patient to improve B hip strength to >/= 4+/5 without pain    Status  Achieved            Plan - 03/20/18 0910    Clinical Impression Statement  Pt. reporting upcoming epidural injection and noted he would be interested in possible -30-day hold following session today to wait and see response.  PT approving this thus reviewed comprehensive HEP with pt. to check for appropriateness with pt. verbalizing understanding.  Pt. demonstrating somewhat improved lumbar AROM today and R glute/piriformis flexibility today however continues with global LE tightness most seen with B HS and piriformis with ongoing LE guarding tendency.  Pt. noting some remaining R glute/piriformis ttp and R LE radicular symptoms earlier this morning thus addressed hip musculature with STM/TPR with good response following.  Still not answer from insurance regarding TENS unit coverage at this time.  Pt. now on 30-day hold and has met or partially met all LTG's.    PT Treatment/Interventions  ADLs/Self Care Home Management;Cryotherapy;Dealer  Stimulation;Moist Heat;Traction;Therapeutic exercise;Therapeutic activities;Functional mobility training;Ultrasound;Neuromuscular re-education;Patient/family education;Manual  techniques;Vasopneumatic Device;Taping;Dry needling;Passive range of motion    PT Next Visit Plan  30-day hold    Consulted and Agree with Plan of Care  Patient       Patient will benefit from skilled therapeutic intervention in order to improve the following deficits and impairments:  Pain, Decreased range of motion, Decreased mobility, Decreased strength, Decreased activity tolerance  Visit Diagnosis: Chronic right-sided low back pain with right-sided sciatica  Pain in right hip  Other symptoms and signs involving the musculoskeletal system     Problem List Patient Active Problem List   Diagnosis Date Noted  . Coronary artery disease due to lipid rich plaque 07/20/2014  . Hyperlipidemia 07/20/2013  . HTN (hypertension) 07/20/2013  . Angina decubitus ([place]) 07/20/2013  . Old MI (myocardial infarction) 07/20/2013    Bess Harvest, PTA 03/20/18 12:15 PM   [facility] [address]  [place], [place], [address] Phone: [phone]   Fax:  [phone]  Name: LEOPOLD SMYERS MRN: [id number] Date of Birth: 02-28-1934  PHYSICAL THERAPY DISCHARGE SUMMARY  Visits from Start of Care: 17  Current functional level related to goals / functional outcomes: See above clinical summary   Remaining deficits: Decreased LE flexibility and limited lumbar AROM   Education / Equipment: HEP  Plan: Patient agrees to discharge.  Patient goals were partially met. Patient is being discharged due to being pleased with the current functional level.  ?????    Janene Harvey, PT, DPT 04/22/18 12:41 PM

## 2018-03-24 DIAGNOSIS — G5701 Lesion of sciatic nerve, right lower limb: Secondary | ICD-10-CM | POA: Diagnosis not present

## 2018-04-06 ENCOUNTER — Ambulatory Visit: Payer: PPO | Admitting: Cardiology

## 2018-04-06 ENCOUNTER — Encounter (INDEPENDENT_AMBULATORY_CARE_PROVIDER_SITE_OTHER): Payer: Self-pay

## 2018-04-06 ENCOUNTER — Encounter: Payer: Self-pay | Admitting: Cardiology

## 2018-04-06 VITALS — BP 154/84 | HR 63 | Ht 69.0 in | Wt 190.0 lb

## 2018-04-06 DIAGNOSIS — I2583 Coronary atherosclerosis due to lipid rich plaque: Secondary | ICD-10-CM | POA: Diagnosis not present

## 2018-04-06 DIAGNOSIS — E7800 Pure hypercholesterolemia, unspecified: Secondary | ICD-10-CM

## 2018-04-06 DIAGNOSIS — I2510 Atherosclerotic heart disease of native coronary artery without angina pectoris: Secondary | ICD-10-CM | POA: Diagnosis not present

## 2018-04-06 DIAGNOSIS — I208 Other forms of angina pectoris: Secondary | ICD-10-CM

## 2018-04-06 MED ORDER — CARVEDILOL 6.25 MG PO TABS: 6.2500 mg | ORAL_TABLET | Freq: Two times a day (BID) | ORAL | 3 refills | Status: DC

## 2018-04-06 NOTE — Patient Instructions (Signed)
Medication Instructions:  Please increase your Carvedilol to 6.25 mg twice a day. Continue all other medications as listed.  Follow-Up: Follow up in 1 month with the [facility].  If you need a refill on your cardiac medications before your next appointment, please call your pharmacy.  Thank you for choosing [facility]!!

## 2018-04-06 NOTE — Progress Notes (Signed)
[facility]. [address], [place]  [address] Phone: [phone] Fax:  [phone]  Date:  04/06/2018   ID:  Connor Hansen, DOB [birth year], MRN [id number]  PCP:  Wenda Low, MD   History of Present Illness: Connor Hansen is a 84 y.o. male who several years ago he had a myocardial infarction treated with thrombolysis. Does not recall a heart catheterization at that time. More recently in 2010 was admitted to the hospital via emergency room with equivocal ST segment changes and chest discomfort. Cardiac catheterization in 2010 at that time demonstrated a high grade mid LAD lesion in addition to a 99% subtotal length the mid right coronary artery lesion. It was difficult to ascertain which one was the culprit and a ventriculogram was done prior to intervention that demonstrated a modest size apical hypokinesis. Accordingly a stent was directed to the mid LAD lesion, bare-metal stent. The right coronary artery was also stented with a bare-metal stent at that time because it was felt that this lesion may occlude in a short period of time. This was done in February of 2010 with procedure performed at [facility].  1992 MI in [place].  Has established care here. Echocardiogram done on Jan 03, 2009 shows mild LVH, normal EF, distal anteroseptal and apical akinesis consistent with myocardial infarction, mild aortic sclerosis. Trivial mitral regurgitation and tricuspid regurgitation.  Overall his LDL has been 78, well controlled on atorvastatin 80. We went over goals. His hypertension is also well controlled. He occasionally will feel palpitations but this is quite rare. He has been having some back pain, this limits his playing of golf.   09/27/15 - No chest pain, no anginal symptoms. Doing well. Macular degeneration. Prior cataract surgery. No symptoms from bradycardia.  12/12/16-overall he is doing very well. Still playing golf. No active exertional symptoms. Sometimes he has mild  shortness of breath with increased activity. Twice a year he can feel his heart race but this is very rare. No chest pain.  04/06/18 - BP has never been high but last few visits it has been high.  He has had some back issues recently, mild back pain.  He has been walking.  No chest pain fevers chills nausea vomiting shortness of breath.   Wt Readings from Last 3 Encounters:  04/06/18 190 lb (86.2 kg)  12/12/16 180 lb 6.4 oz (81.8 kg)  09/27/15 186 lb 6.4 oz (84.6 kg)     Past Medical History:  Diagnosis Date  . CAD (coronary artery disease)    2/10 - BMS to mid LAD, RCA. Anteroapical akinesis - normal EF. 2010  . CKD (chronic kidney disease)   . Coronary atherosclerosis of native coronary artery   . GERD (gastroesophageal reflux disease)   . HTN (hypertension)   . Hyperlipidemia   . Hypothyroidism   . Old myocardial infarct   . RA (rheumatoid arthritis) ([place])     Past Surgical History:  Procedure Laterality Date  . CHOLECYSTECTOMY    . HEMILAMINOTOMY LUMBAR SPINE      Current Outpatient Medications  Medication Sig Dispense Refill  . allopurinol (ZYLOPRIM) 100 MG tablet Take 100 mg by mouth daily.    Marland Kitchen aspirin 81 MG tablet Take 81 mg by mouth daily.    Marland Kitchen atorvastatin (LIPITOR) 80 MG tablet Take 80 mg by mouth daily.    . calcium & magnesium carbonates (MYLANTA) 311-232 MG per tablet Take 1 tablet by mouth daily.    Marland Kitchen  carvedilol (COREG) 6.25 MG tablet Take 1 tablet (6.25 mg total) by mouth 2 (two) times daily with a meal. 180 tablet 3  . Cholecalciferol (VITAMIN D) 400 UNITS capsule Take 400 Units by mouth daily.    . famotidine (PEPCID) 20 MG tablet Take 20 mg by mouth 2 (two) times daily.    . folic acid (FOLVITE) 1 MG tablet Take 2 mg by mouth 2 (two) times daily.    Marland Kitchen leflunomide (ARAVA) 20 MG tablet Take 1 tablet by mouth at bedtime.    Marland Kitchen levothyroxine (SYNTHROID, LEVOTHROID) 75 MCG tablet Take 75 mcg by mouth daily before breakfast.    . methotrexate (RHEUMATREX) 2.5 MG  tablet Take 20 mg by mouth once a week. Caution:Chemotherapy. Protect from light.    . Multiple Vitamin (MULTIVITAMIN) tablet Take 1 tablet by mouth daily.    . Multiple Vitamins-Minerals (PRESERVISION/LUTEIN PO) Take 500 mg by mouth daily.    . Omega-3 Fatty Acids (FISH OIL PO) Take 1,000 capsules by mouth daily.    Marland Kitchen omeprazole (PRILOSEC) 20 MG capsule Take 20 mg by mouth daily.    . predniSONE (DELTASONE) 5 MG tablet Take 5 mg by mouth daily.    . traMADol (ULTRAM) 50 MG tablet Take 50 mg by mouth as needed.     No current facility-administered medications for this visit.     Allergies:   No Known Allergies  Social History:  The patient  reports that he has quit smoking. His smoking use included cigarettes. He quit after 25.00 years of use. He has never used smokeless tobacco. He reports that he drinks alcohol. He reports that he does not use drugs.   ROS:  Please see the history of present illness.  All others negative PHYSICAL EXAM: VS:  BP (!) 154/84   Pulse 63   Ht 5\' 9"  (1.753 m)   Wt 190 lb (86.2 kg)   BMI 28.06 kg/m  GEN: Well nourished, well developed, in no acute distress  HEENT: normal  Neck: no JVD, carotid bruits, or masses Cardiac: RRR; no murmurs, rubs, or gallops,no edema  Respiratory:  clear to auscultation bilaterally, normal work of breathing GI: soft, nontender, nondistended, + BS MS: no deformity or atrophy  Skin: warm and dry, no rash Neuro:  Alert and Oriented x 3, Strength and sensation are intact Psych: euthymic mood, full affect   EKG:  EKG today 04/06/2018-sinus rhythm 17 old inferior infarct, poor R wave progression as well no other abnormalities.  Personally viewed shows sinus rhythm 61, old inferior infarct pattern, no other abnormalities, old anterior infarct pattern. Personally viewed. 07/20/14-sinus bradycardia rate 55, inferior infarct pattern-previously Sinus bradycardia rate 48, inferior infarct pattern, poor R wave progression     ASSESSMENT  AND PLAN:   1. Coronary artery disease-status post PCI-currently doing very well, no anginal symptoms. Continue with aggressive secondary prevention. Sometimes mild racing heart with exertion. Palps. Rare, 2 times a year.  2. Angina-well controlled. We discussed the possibility of repeat revascularization in the future if necessary. Hopefully with aggressive secondary prevention this may not be an issue. Stable, no active symptoms. 3. Hyperlipidemia-continue with high-dose statin therapy. No changes made. LDL excellent  63-69. Prior lab work reviewed. Doing very well. Stable, no myalgias, no changes 4. Sinus bradycardia-this has resolved since I decreased his carvedilol to 3.125 mg twice a day, however since his blood pressure has increased, moving it back to 6.25mg  BID. 5. We will have him come back in in 1 month  to sit down with our hypertension clinic to make sure that his blood pressure is under reasonable range.  Blood pressure on repeat was very similar. 6. One-year follow-up otherwise  Signed, Candee Furbish, MD [facility]  04/06/2018 3:57 PM

## 2018-04-10 DIAGNOSIS — D1801 Hemangioma of skin and subcutaneous tissue: Secondary | ICD-10-CM | POA: Diagnosis not present

## 2018-04-10 DIAGNOSIS — L821 Other seborrheic keratosis: Secondary | ICD-10-CM | POA: Diagnosis not present

## 2018-04-10 DIAGNOSIS — L720 Epidermal cyst: Secondary | ICD-10-CM | POA: Diagnosis not present

## 2018-04-10 DIAGNOSIS — L820 Inflamed seborrheic keratosis: Secondary | ICD-10-CM | POA: Diagnosis not present

## 2018-04-10 DIAGNOSIS — Z85828 Personal history of other malignant neoplasm of skin: Secondary | ICD-10-CM | POA: Diagnosis not present

## 2018-04-27 DIAGNOSIS — M5416 Radiculopathy, lumbar region: Secondary | ICD-10-CM | POA: Diagnosis not present

## 2018-04-27 DIAGNOSIS — I10 Essential (primary) hypertension: Secondary | ICD-10-CM | POA: Diagnosis not present

## 2018-05-06 DIAGNOSIS — H35423 Microcystoid degeneration of retina, bilateral: Secondary | ICD-10-CM | POA: Diagnosis not present

## 2018-05-06 DIAGNOSIS — H33193 Other retinoschisis and retinal cysts, bilateral: Secondary | ICD-10-CM | POA: Diagnosis not present

## 2018-05-06 DIAGNOSIS — H353231 Exudative age-related macular degeneration, bilateral, with active choroidal neovascularization: Secondary | ICD-10-CM | POA: Diagnosis not present

## 2018-05-06 DIAGNOSIS — H35433 Paving stone degeneration of retina, bilateral: Secondary | ICD-10-CM | POA: Diagnosis not present

## 2018-05-07 DIAGNOSIS — J060 Acute laryngopharyngitis: Secondary | ICD-10-CM | POA: Diagnosis not present

## 2018-05-11 DIAGNOSIS — R350 Frequency of micturition: Secondary | ICD-10-CM | POA: Diagnosis not present

## 2018-05-11 DIAGNOSIS — R509 Fever, unspecified: Secondary | ICD-10-CM | POA: Diagnosis not present

## 2018-05-12 ENCOUNTER — Other Ambulatory Visit: Payer: Self-pay | Admitting: Internal Medicine

## 2018-05-12 ENCOUNTER — Ambulatory Visit
Admission: RE | Admit: 2018-05-12 | Discharge: 2018-05-12 | Disposition: A | Discharge status: Home / Self Care | Payer: PPO | Source: Ambulatory Visit | Attending: Internal Medicine | Admitting: Internal Medicine

## 2018-05-12 DIAGNOSIS — R509 Fever, unspecified: Secondary | ICD-10-CM

## 2018-05-12 DIAGNOSIS — R05 Cough: Secondary | ICD-10-CM | POA: Diagnosis not present

## 2018-05-13 ENCOUNTER — Ambulatory Visit: Payer: PPO

## 2018-05-18 DIAGNOSIS — M545 Low back pain: Secondary | ICD-10-CM | POA: Diagnosis not present

## 2018-05-18 DIAGNOSIS — M5416 Radiculopathy, lumbar region: Secondary | ICD-10-CM | POA: Diagnosis not present

## 2018-05-18 DIAGNOSIS — M47816 Spondylosis without myelopathy or radiculopathy, lumbar region: Secondary | ICD-10-CM | POA: Diagnosis not present

## 2018-05-18 DIAGNOSIS — Z6827 Body mass index (BMI) 27.0-27.9, adult: Secondary | ICD-10-CM | POA: Diagnosis not present

## 2018-05-19 DIAGNOSIS — M2550 Pain in unspecified joint: Secondary | ICD-10-CM | POA: Diagnosis not present

## 2018-05-19 DIAGNOSIS — E663 Overweight: Secondary | ICD-10-CM | POA: Diagnosis not present

## 2018-05-19 DIAGNOSIS — Z79899 Other long term (current) drug therapy: Secondary | ICD-10-CM | POA: Diagnosis not present

## 2018-05-19 DIAGNOSIS — M0609 Rheumatoid arthritis without rheumatoid factor, multiple sites: Secondary | ICD-10-CM | POA: Diagnosis not present

## 2018-05-19 DIAGNOSIS — M159 Polyosteoarthritis, unspecified: Secondary | ICD-10-CM | POA: Diagnosis not present

## 2018-05-19 DIAGNOSIS — Z6827 Body mass index (BMI) 27.0-27.9, adult: Secondary | ICD-10-CM | POA: Diagnosis not present

## 2018-05-19 DIAGNOSIS — M1009 Idiopathic gout, multiple sites: Secondary | ICD-10-CM | POA: Diagnosis not present

## 2018-05-20 DIAGNOSIS — R05 Cough: Secondary | ICD-10-CM | POA: Diagnosis not present

## 2018-05-20 DIAGNOSIS — Z23 Encounter for immunization: Secondary | ICD-10-CM | POA: Diagnosis not present

## 2018-05-20 DIAGNOSIS — I10 Essential (primary) hypertension: Secondary | ICD-10-CM | POA: Diagnosis not present

## 2018-05-27 DIAGNOSIS — Z79899 Other long term (current) drug therapy: Secondary | ICD-10-CM | POA: Diagnosis not present

## 2018-05-27 DIAGNOSIS — M0609 Rheumatoid arthritis without rheumatoid factor, multiple sites: Secondary | ICD-10-CM | POA: Diagnosis not present

## 2018-06-17 ENCOUNTER — Ambulatory Visit
Admission: RE | Admit: 2018-06-17 | Discharge: 2018-06-17 | Disposition: A | Discharge status: Home / Self Care | Payer: PPO | Source: Ambulatory Visit | Attending: Internal Medicine | Admitting: Internal Medicine

## 2018-06-17 ENCOUNTER — Other Ambulatory Visit: Payer: Self-pay | Admitting: Internal Medicine

## 2018-06-17 DIAGNOSIS — R05 Cough: Secondary | ICD-10-CM

## 2018-06-17 DIAGNOSIS — R059 Cough, unspecified: Secondary | ICD-10-CM

## 2018-07-07 DIAGNOSIS — M5416 Radiculopathy, lumbar region: Secondary | ICD-10-CM | POA: Diagnosis not present

## 2018-08-26 DIAGNOSIS — E663 Overweight: Secondary | ICD-10-CM | POA: Diagnosis not present

## 2018-08-26 DIAGNOSIS — M2550 Pain in unspecified joint: Secondary | ICD-10-CM | POA: Diagnosis not present

## 2018-08-26 DIAGNOSIS — Z6828 Body mass index (BMI) 28.0-28.9, adult: Secondary | ICD-10-CM | POA: Diagnosis not present

## 2018-08-26 DIAGNOSIS — M0609 Rheumatoid arthritis without rheumatoid factor, multiple sites: Secondary | ICD-10-CM | POA: Diagnosis not present

## 2018-08-26 DIAGNOSIS — Z79899 Other long term (current) drug therapy: Secondary | ICD-10-CM | POA: Diagnosis not present

## 2018-08-26 DIAGNOSIS — M159 Polyosteoarthritis, unspecified: Secondary | ICD-10-CM | POA: Diagnosis not present

## 2018-08-26 DIAGNOSIS — M1009 Idiopathic gout, multiple sites: Secondary | ICD-10-CM | POA: Diagnosis not present

## 2018-09-02 DIAGNOSIS — H33193 Other retinoschisis and retinal cysts, bilateral: Secondary | ICD-10-CM | POA: Diagnosis not present

## 2018-09-02 DIAGNOSIS — H35423 Microcystoid degeneration of retina, bilateral: Secondary | ICD-10-CM | POA: Diagnosis not present

## 2018-09-02 DIAGNOSIS — H353232 Exudative age-related macular degeneration, bilateral, with inactive choroidal neovascularization: Secondary | ICD-10-CM | POA: Diagnosis not present

## 2018-09-02 DIAGNOSIS — H35433 Paving stone degeneration of retina, bilateral: Secondary | ICD-10-CM | POA: Diagnosis not present

## 2018-09-09 DIAGNOSIS — Z23 Encounter for immunization: Secondary | ICD-10-CM | POA: Diagnosis not present

## 2018-09-09 DIAGNOSIS — M109 Gout, unspecified: Secondary | ICD-10-CM | POA: Diagnosis not present

## 2018-09-09 DIAGNOSIS — E039 Hypothyroidism, unspecified: Secondary | ICD-10-CM | POA: Diagnosis not present

## 2018-09-09 DIAGNOSIS — M1990 Unspecified osteoarthritis, unspecified site: Secondary | ICD-10-CM | POA: Diagnosis not present

## 2018-09-09 DIAGNOSIS — Z1389 Encounter for screening for other disorder: Secondary | ICD-10-CM | POA: Diagnosis not present

## 2018-09-09 DIAGNOSIS — I252 Old myocardial infarction: Secondary | ICD-10-CM | POA: Diagnosis not present

## 2018-09-09 DIAGNOSIS — R7303 Prediabetes: Secondary | ICD-10-CM | POA: Diagnosis not present

## 2018-09-09 DIAGNOSIS — M069 Rheumatoid arthritis, unspecified: Secondary | ICD-10-CM | POA: Diagnosis not present

## 2018-09-09 DIAGNOSIS — J0100 Acute maxillary sinusitis, unspecified: Secondary | ICD-10-CM | POA: Diagnosis not present

## 2018-09-09 DIAGNOSIS — K219 Gastro-esophageal reflux disease without esophagitis: Secondary | ICD-10-CM | POA: Diagnosis not present

## 2018-09-09 DIAGNOSIS — E782 Mixed hyperlipidemia: Secondary | ICD-10-CM | POA: Diagnosis not present

## 2018-09-09 DIAGNOSIS — Z0000 Encounter for general adult medical examination without abnormal findings: Secondary | ICD-10-CM | POA: Diagnosis not present

## 2018-09-09 DIAGNOSIS — I10 Essential (primary) hypertension: Secondary | ICD-10-CM | POA: Diagnosis not present

## 2018-09-09 DIAGNOSIS — E7800 Pure hypercholesterolemia, unspecified: Secondary | ICD-10-CM | POA: Diagnosis not present

## 2018-09-09 DIAGNOSIS — R972 Elevated prostate specific antigen [PSA]: Secondary | ICD-10-CM | POA: Diagnosis not present

## 2018-09-09 DIAGNOSIS — I2510 Atherosclerotic heart disease of native coronary artery without angina pectoris: Secondary | ICD-10-CM | POA: Diagnosis not present

## 2018-09-28 DIAGNOSIS — M5416 Radiculopathy, lumbar region: Secondary | ICD-10-CM | POA: Diagnosis not present

## 2018-09-28 DIAGNOSIS — M47816 Spondylosis without myelopathy or radiculopathy, lumbar region: Secondary | ICD-10-CM | POA: Diagnosis not present

## 2018-09-28 DIAGNOSIS — M545 Low back pain: Secondary | ICD-10-CM | POA: Diagnosis not present

## 2018-10-14 DIAGNOSIS — R6884 Jaw pain: Secondary | ICD-10-CM | POA: Diagnosis not present

## 2018-10-14 DIAGNOSIS — R070 Pain in throat: Secondary | ICD-10-CM | POA: Diagnosis not present

## 2018-10-27 DIAGNOSIS — R6884 Jaw pain: Secondary | ICD-10-CM | POA: Diagnosis not present

## 2018-10-27 DIAGNOSIS — R070 Pain in throat: Secondary | ICD-10-CM | POA: Diagnosis not present

## 2018-10-27 DIAGNOSIS — I700 Atherosclerosis of aorta: Secondary | ICD-10-CM | POA: Diagnosis not present

## 2018-11-25 DIAGNOSIS — M0609 Rheumatoid arthritis without rheumatoid factor, multiple sites: Secondary | ICD-10-CM | POA: Diagnosis not present

## 2018-11-25 DIAGNOSIS — Z79899 Other long term (current) drug therapy: Secondary | ICD-10-CM | POA: Diagnosis not present

## 2018-12-22 DIAGNOSIS — H353232 Exudative age-related macular degeneration, bilateral, with inactive choroidal neovascularization: Secondary | ICD-10-CM | POA: Diagnosis not present

## 2019-02-24 DIAGNOSIS — M2550 Pain in unspecified joint: Secondary | ICD-10-CM | POA: Diagnosis not present

## 2019-02-24 DIAGNOSIS — M1009 Idiopathic gout, multiple sites: Secondary | ICD-10-CM | POA: Diagnosis not present

## 2019-02-24 DIAGNOSIS — M159 Polyosteoarthritis, unspecified: Secondary | ICD-10-CM | POA: Diagnosis not present

## 2019-02-24 DIAGNOSIS — Z79899 Other long term (current) drug therapy: Secondary | ICD-10-CM | POA: Diagnosis not present

## 2019-02-24 DIAGNOSIS — M0609 Rheumatoid arthritis without rheumatoid factor, multiple sites: Secondary | ICD-10-CM | POA: Diagnosis not present

## 2019-04-08 ENCOUNTER — Telehealth: Payer: Self-pay | Admitting: Cardiology

## 2019-04-08 NOTE — Telephone Encounter (Signed)
I spoke to the patient who noticed bilateral LLE the past 2 months.  He denies SOB/CP or any other symptoms.  He does "salt" his wife's cooking.    I recommended that he reduces salt intake and elevate legs while sitting.  He will also weigh daily and if he gains more than 3 lbs in 1 day or 5 lbs in 1 week, he will call us.  He will keep appointment with Dr Marlou Porch 9/2 unless things improve, he will then call office Tuesday.

## 2019-04-08 NOTE — Telephone Encounter (Signed)
New Message  Pt c/o swelling: STAT is pt has developed SOB within 24 hours  1) How much weight have you gained and in what time span? n/a  2) If swelling, where is the swelling located? Both legs and ankles (knee down)  3) Are you currently taking a fluid pill? no  4) Are you currently SOB? no  5) Do you have a log of your daily weights (if so, list)? No, but he will start  6) Have you gained 3 pounds in a day or 5 pounds in a week? n/a  7) Have you traveled recently? no

## 2019-04-09 NOTE — Telephone Encounter (Signed)
Noted! Thank you

## 2019-04-14 ENCOUNTER — Encounter: Payer: Self-pay | Admitting: Cardiology

## 2019-04-14 ENCOUNTER — Other Ambulatory Visit: Payer: Self-pay

## 2019-04-14 ENCOUNTER — Ambulatory Visit (INDEPENDENT_AMBULATORY_CARE_PROVIDER_SITE_OTHER): Payer: PPO | Admitting: Cardiology

## 2019-04-14 VITALS — BP 112/60 | HR 59 | Ht 69.0 in | Wt 194.4 lb

## 2019-04-14 DIAGNOSIS — I208 Other forms of angina pectoris: Secondary | ICD-10-CM

## 2019-04-14 DIAGNOSIS — I2510 Atherosclerotic heart disease of native coronary artery without angina pectoris: Secondary | ICD-10-CM

## 2019-04-14 DIAGNOSIS — R600 Localized edema: Secondary | ICD-10-CM

## 2019-04-14 DIAGNOSIS — E7800 Pure hypercholesterolemia, unspecified: Secondary | ICD-10-CM

## 2019-04-14 DIAGNOSIS — I2583 Coronary atherosclerosis due to lipid rich plaque: Secondary | ICD-10-CM

## 2019-04-14 MED ORDER — FUROSEMIDE 20 MG PO TABS: 20.0000 mg | ORAL_TABLET | Freq: Every day | ORAL | 1 refills | Status: DC

## 2019-04-14 NOTE — Patient Instructions (Signed)
Medication Instructions:   START TAKING LASIX 20 MG BY MOUTH DAILY FOR YOUR LOWER EXTREMITY SWELLING--PER DR. Marlou Porch IF YOUR SWELLING HAS IMPROVED YOU MAY STOP TAKING THIS.   If you need a refill on your cardiac medications before your next appointment, please call your pharmacy.    Lab work:  TODAY-BMET  If you have labs (blood work) drawn today and your tests are completely normal, you will receive your results only by: Marland Kitchen MyChart Message (if you have MyChart) OR . A paper copy in the mail If you have any lab test that is abnormal or we need to change your treatment, we will call you to review the results.    Testing/Procedures:  Your physician has requested that you have an echocardiogram. Echocardiography is a painless test that uses sound waves to create images of your heart. It provides your doctor with information about the size and shape of your heart and how well your heart's chambers and valves are working. This procedure takes approximately one hour. There are no restrictions for this procedure.   Your physician has requested that you have a lower extremity venous duplex. This test is an ultrasound of the veins in the legs or arms. It looks at venous blood flow that carries blood from the heart to the legs or arms. Allow one hour for a Lower Venous exam. Allow thirty minutes for an Upper Venous exam. There are no restrictions or special instructions.     Follow-Up: At [facility], you and your health needs are our priority.  As part of our continuing mission to provide you with exceptional heart care, we have created designated Provider Care Teams.  These Care Teams include your primary Cardiologist (physician) and Advanced Practice Providers (APPs -  Physician Assistants and Nurse Practitioners) who all work together to provide you with the care you need, when you need it. You will need a follow up appointment in 12 months with Dr. Marlou Porch.  Please call our office 2 months  in advance to schedule this appointment.  You may see Candee Furbish, MD or one of the following Advanced Practice Providers on your designated Care Team:   Truitt Merle, NP Cecilie Kicks, NP . Kathyrn Drown, NP

## 2019-04-14 NOTE — Progress Notes (Signed)
[facility]. [address] [place], [place]  [address] Phone: [phone] Fax:  [phone]  Date:  04/14/2019   ID:  Connor Hansen, DOB [birth year], MRN [id number]  PCP:  Wenda Low, MD   History of Present Illness: Connor Hansen is a 85 y.o. male who several years ago he had a myocardial infarction treated with thrombolysis. Does not recall a heart catheterization at that time. More recently in 2010 was admitted to the hospital via emergency room with equivocal ST segment changes and chest discomfort. Cardiac catheterization in 2010 at that time demonstrated a high grade mid LAD lesion in addition to a 99% subtotal length the mid right coronary artery lesion. It was difficult to ascertain which one was the culprit and a ventriculogram was done prior to intervention that demonstrated a modest size apical hypokinesis. Accordingly a stent was directed to the mid LAD lesion, bare-metal stent. The right coronary artery was also stented with a bare-metal stent at that time because it was felt that this lesion may occlude in a short period of time. This was done in February of 2010 with procedure performed at [facility].  1992 MI in [place].  Has established care here. Echocardiogram done on Jan 03, 2009 shows mild LVH, normal EF, distal anteroseptal and apical akinesis consistent with myocardial infarction, mild aortic sclerosis. Trivial mitral regurgitation and tricuspid regurgitation.  Overall his LDL has been 78, well controlled on atorvastatin 80. We went over goals. His hypertension is also well controlled. He occasionally will feel palpitations but this is quite rare. He has been having some back pain, this limits his playing of golf.   09/27/15 - No chest pain, no anginal symptoms. Doing well. Macular degeneration. Prior cataract surgery. No symptoms from bradycardia.  12/12/16-overall he is doing very well. Still playing golf. No active exertional symptoms. Sometimes he has mild  shortness of breath with increased activity. Twice a year he can feel his heart race but this is very rare. No chest pain.  04/06/18 - BP has never been high but last few visits it has been high.  He has had some back issues recently, mild back pain.  He has been walking.  No chest pain fevers chills nausea vomiting shortness of breath.  04/14/2019- here for follow-up of CAD.  Still complaining of some lower extremity edema.  Prior visits has had some back pain.  His prednisone certainly could be playing a role in this.  No fevers chills nausea vomiting syncope bleeding.  Sore legs. 3 months. Edema bilat. Prednisone tapering off, now off 1 month.  He had been on the prednisone for many years without any lower extremity edema.  Unusual for him.  He has been sitting a lot more since COVID-19.  No unusual clotting disorder previously.  Calves are tender he states.   Wt Readings from Last 3 Encounters:  04/14/19 194 lb 6.4 oz (88.2 kg)  04/06/18 190 lb (86.2 kg)  12/12/16 180 lb 6.4 oz (81.8 kg)     Past Medical History:  Diagnosis Date  . CAD (coronary artery disease)    2/10 - BMS to mid LAD, RCA. Anteroapical akinesis - normal EF. 2010  . CKD (chronic kidney disease)   . Coronary atherosclerosis of native coronary artery   . GERD (gastroesophageal reflux disease)   . HTN (hypertension)   . Hyperlipidemia   . Hypothyroidism   . Old myocardial infarct   . RA (rheumatoid arthritis) ([place])  Past Surgical History:  Procedure Laterality Date  . CHOLECYSTECTOMY    . HEMILAMINOTOMY LUMBAR SPINE      Current Outpatient Medications  Medication Sig Dispense Refill  . allopurinol (ZYLOPRIM) 100 MG tablet Take 100 mg by mouth daily.    Marland Kitchen aspirin 81 MG tablet Take 81 mg by mouth daily.    Marland Kitchen atorvastatin (LIPITOR) 80 MG tablet Take 80 mg by mouth daily.    . calcium & magnesium carbonates (MYLANTA) 311-232 MG per tablet Take 1 tablet by mouth daily.    . carvedilol (COREG) 6.25 MG tablet Take  1 tablet (6.25 mg total) by mouth 2 (two) times daily with a meal. 180 tablet 3  . Cholecalciferol (VITAMIN D) 400 UNITS capsule Take 400 Units by mouth daily.    . famotidine (PEPCID) 20 MG tablet Take 20 mg by mouth 2 (two) times daily.    . folic acid (FOLVITE) 1 MG tablet Take 2 mg by mouth 2 (two) times daily.    Marland Kitchen leflunomide (ARAVA) 20 MG tablet Take 1 tablet by mouth at bedtime.    Marland Kitchen levothyroxine (SYNTHROID, LEVOTHROID) 75 MCG tablet Take 75 mcg by mouth daily before breakfast.    . losartan (COZAAR) 50 MG tablet Take 50 mg by mouth daily.    . methotrexate (RHEUMATREX) 2.5 MG tablet Take 20 mg by mouth once a week. Caution:Chemotherapy. Protect from light.    . Multiple Vitamin (MULTIVITAMIN) tablet Take 1 tablet by mouth daily.    . Multiple Vitamins-Minerals (PRESERVISION/LUTEIN PO) Take 500 mg by mouth daily.    . Omega-3 Fatty Acids (FISH OIL PO) Take 1,000 capsules by mouth daily.    Marland Kitchen omeprazole (PRILOSEC) 20 MG capsule Take 20 mg by mouth daily.    . traMADol (ULTRAM) 50 MG tablet Take 50 mg by mouth as needed.    . furosemide (LASIX) 20 MG tablet Take 1 tablet (20 mg total) by mouth daily. 90 tablet 1   No current facility-administered medications for this visit.     Allergies:   No Known Allergies  Social History:  The patient  reports that he has quit smoking. His smoking use included cigarettes. He quit after 25.00 years of use. He has never used smokeless tobacco. He reports current alcohol use. He reports that he does not use drugs.   ROS:  Please see the history of present illness.  All others negative  PHYSICAL EXAM: VS:  BP 112/60   Pulse (!) 59   Ht 5\' 9"  (1.753 m)   Wt 194 lb 6.4 oz (88.2 kg)   SpO2 97%   BMI 28.71 kg/m  GEN: Well nourished, well developed, in no acute distress  HEENT: normal  Neck: no JVD, carotid bruits, or masses Cardiac: RRR; no murmurs, rubs, or gallops, 2+ lower extremity edema past the knees  Respiratory:  clear to auscultation  bilaterally, normal work of breathing GI: soft, nontender, nondistended, + BS MS: no deformity or atrophy  Skin: warm and dry, no rash Neuro:  Alert and Oriented x 3, Strength and sensation are intact Psych: euthymic mood, full affect    EKG:  EKG today 04/14/2019-sinus bradycardia 59 poor R wave progression inferior infarct pattern personally reviewed-04/06/2018-sinus rhythm 62 old inferior infarct, poor R wave progression as well no other abnormalities.  Personally viewed shows sinus rhythm 61, old inferior infarct pattern, no other abnormalities, old anterior infarct pattern. Personally viewed. 07/20/14-sinus bradycardia rate 55, inferior infarct pattern-previously Sinus bradycardia rate 48, inferior infarct pattern,  poor R wave progression     ASSESSMENT AND PLAN:   1. Lower extremity edema bilaterally- legs are tender, calves are tender, unusual for him.  They are improved today but this is concerning to him.  We will check lower extremity Dopplers to ensure that there is no signs of venous occlusion.  We will also check an echocardiogram since it is been approximately 10 years to check his right heart function.  We will give him Lasix 20 mg once a day.  I will check a basic metabolic profile.  This could be secondary to inflammatory condition, rheumatoid arthritis for instance. 2. Coronary artery disease-status post PCI-LAD disease currently doing very well, no anginal symptoms. Continue with aggressive secondary prevention. Sometimes mild racing heart with exertion  Overall doing quite well.  No changes 3. Angina-well controlled. We discussed the possibility of repeat revascularization in the future if necessary. Hopefully with aggressive secondary prevention this may not be an issue. Stable, no active symptoms. 4. Hyperlipidemia-continue with high-dose statin therapy. No changes made. LDL excellent  63-69. Prior lab work reviewed. Doing very well. Stable, no myalgias, no changes 5. One-year  follow-up otherwise  Signed, Candee Furbish, MD [facility]  04/14/2019 4:23 PM

## 2019-04-15 LAB — BASIC METABOLIC PANEL
BUN/Creatinine Ratio: 8 — ABNORMAL LOW (ref 10–24)
BUN: 8 mg/dL (ref 8–27)
CO2: 26 mmol/L (ref 20–29)
Calcium: 9.1 mg/dL (ref 8.6–10.2)
Chloride: 103 mmol/L (ref 96–106)
Creatinine, Ser: 0.98 mg/dL (ref 0.76–1.27)
GFR calc Af Amer: 82 mL/min/{1.73_m2} (ref >59)
GFR calc non Af Amer: 71 mL/min/{1.73_m2} (ref >59)
Glucose: 93 mg/dL (ref 65–99)
Potassium: 4.3 mmol/L (ref 3.5–5.2)
Sodium: 142 mmol/L (ref 134–144)

## 2019-04-16 ENCOUNTER — Other Ambulatory Visit: Payer: Self-pay | Admitting: Cardiology

## 2019-04-16 DIAGNOSIS — R600 Localized edema: Secondary | ICD-10-CM

## 2019-04-25 IMAGING — DX DG CHEST 2V
2 series · 2 of 2 positions shown · non-contrast
Comparison: 03/10/2017

CLINICAL DATA: Cough, fever, shortness of Breath

EXAM:
CHEST - 2 VIEW

[dg chest 2 view (1 of 2)]
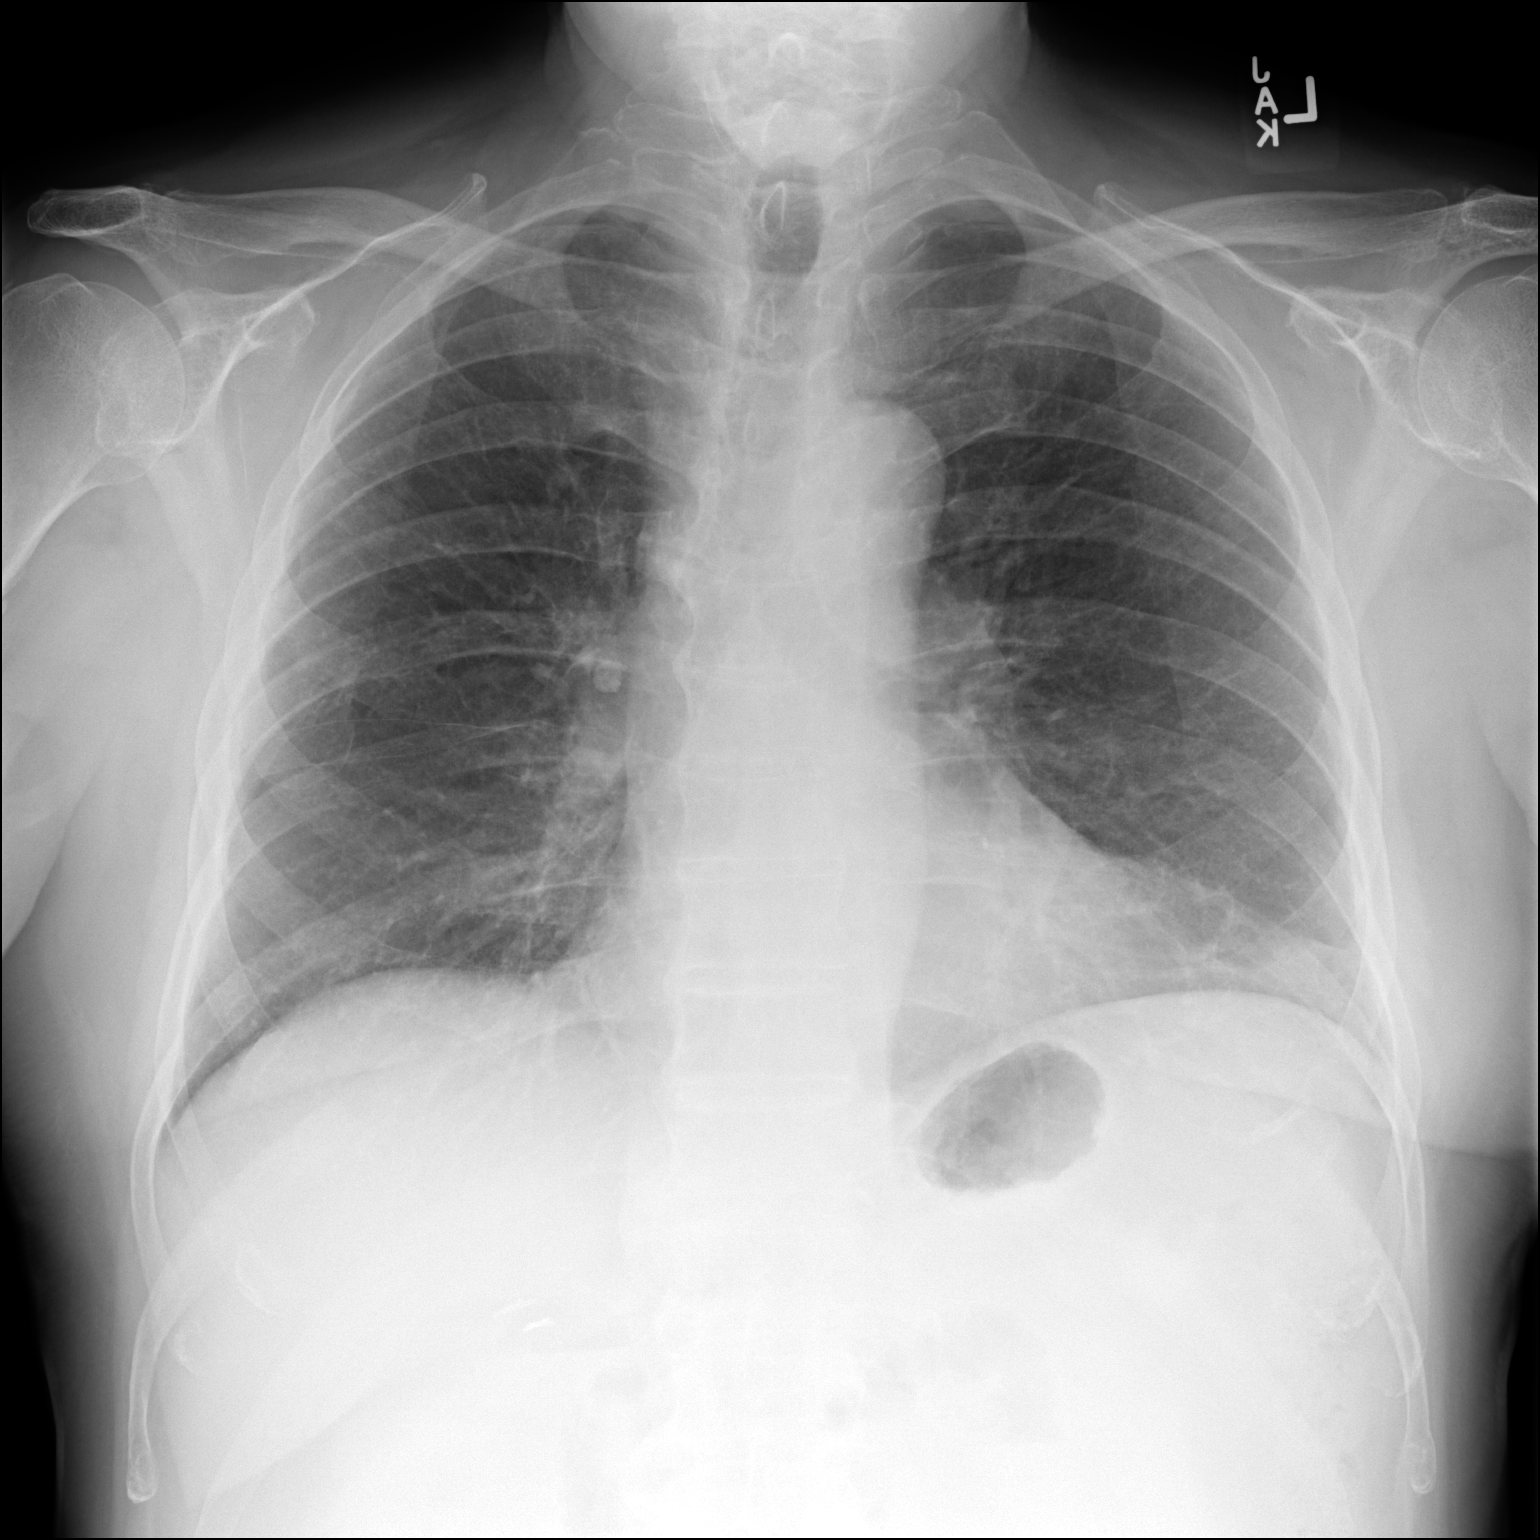

[dg chest 2 view (2 of 2)]
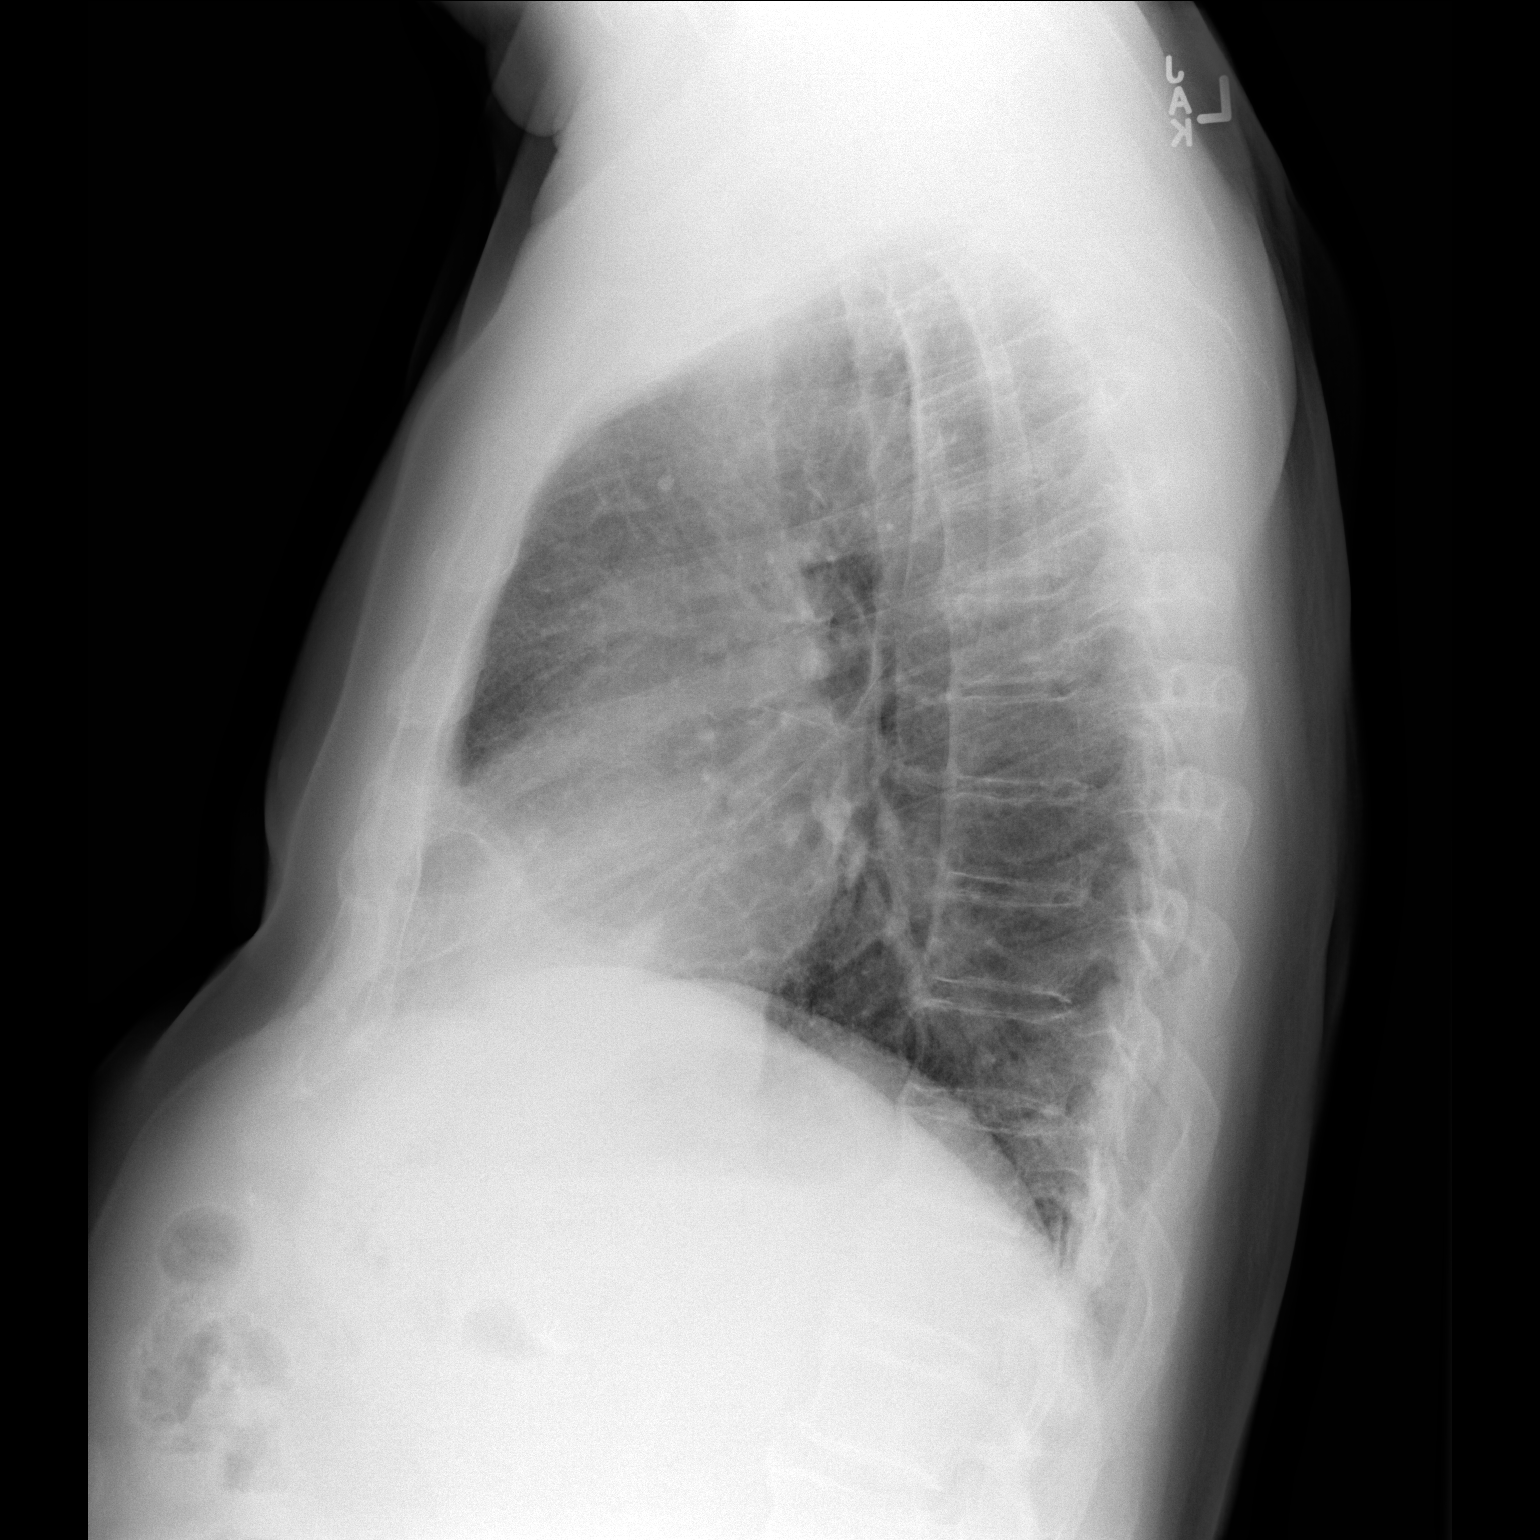

[2 of 2 positions shown; findings below may reference images not displayed]

FINDINGS: Lingular scarring, stable since prior study. Right lung clear. Heart
is normal size. No acute bony abnormality or effusions.
IMPRESSION: Lingular scarring.  No active disease.

## 2019-04-27 ENCOUNTER — Other Ambulatory Visit: Payer: Self-pay

## 2019-04-27 ENCOUNTER — Ambulatory Visit (HOSPITAL_COMMUNITY)
Admission: RE | Admit: 2019-04-27 | Discharge: 2019-04-27 | Disposition: A | Discharge status: Home / Self Care | Payer: PPO | Source: Ambulatory Visit | Attending: Cardiovascular Disease | Admitting: Cardiovascular Disease

## 2019-04-27 ENCOUNTER — Ambulatory Visit (HOSPITAL_BASED_OUTPATIENT_CLINIC_OR_DEPARTMENT_OTHER): Payer: PPO

## 2019-04-27 DIAGNOSIS — R600 Localized edema: Secondary | ICD-10-CM

## 2019-04-27 DIAGNOSIS — I208 Other forms of angina pectoris: Secondary | ICD-10-CM | POA: Insufficient documentation

## 2019-04-27 DIAGNOSIS — E7800 Pure hypercholesterolemia, unspecified: Secondary | ICD-10-CM | POA: Insufficient documentation

## 2019-04-27 DIAGNOSIS — I2510 Atherosclerotic heart disease of native coronary artery without angina pectoris: Secondary | ICD-10-CM | POA: Insufficient documentation

## 2019-04-27 DIAGNOSIS — I2583 Coronary atherosclerosis due to lipid rich plaque: Secondary | ICD-10-CM | POA: Diagnosis not present

## 2019-04-27 MED ORDER — PERFLUTREN LIPID MICROSPHERE
1.0000 mL | INTRAVENOUS | Status: AC | PRN
  Administered 2019-04-27: 1 mL via INTRAVENOUS

## 2019-04-28 ENCOUNTER — Telehealth: Payer: Self-pay | Admitting: Cardiology

## 2019-04-28 NOTE — Telephone Encounter (Signed)
Notes recorded by Theodoro Parma, RN on 04/28/2019 at 8:59 AM EDT  Dr. Marlou Porch reviewed with patient.  ------   Notes recorded by Jerline Pain, MD on 04/28/2019 at 8:32 AM EDT  EF 40-45%, mild to moderate reduced.  Old MI noted.  Continue with current therapy - lasix for edema.  Candee Furbish, MD

## 2019-04-28 NOTE — Telephone Encounter (Signed)
New Message     Patient returning your call about lab results you called them about.

## 2019-04-28 NOTE — Telephone Encounter (Signed)
Pt has been scheduled to see Cecilie Kicks, NP in 05/2019.

## 2019-04-28 NOTE — Telephone Encounter (Signed)
Notes recorded by Theodoro Parma, RN on 04/28/2019 at 9:37 AM EDT  Left message to call back.  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 Dilatation is noted, measuring .90 cm.  Left - normal.  Spoke to Dr. Donzetta Matters of Vascular surgery.  Continue with ASA. No need for anticoagulation.  TED hose/compression stockings.  Lasix.  If symptoms of edema do not improved, he would be happy to see him in Vascular clinic   I spoke with him on phone. Mildly improved edema with lasix. Told him ok to to double up lasix for 2-3 days.   Please set him up with Sharee Pimple or other APP in clinic in one month for follow up. Thanks.  Candee Furbish, MD

## 2019-05-04 ENCOUNTER — Other Ambulatory Visit: Payer: Self-pay | Admitting: Cardiology

## 2019-05-05 ENCOUNTER — Other Ambulatory Visit: Payer: Self-pay | Admitting: Cardiology

## 2019-05-05 MED ORDER — CARVEDILOL 6.25 MG PO TABS: 6.2500 mg | ORAL_TABLET | Freq: Two times a day (BID) | ORAL | 3 refills | Status: DC

## 2019-05-05 NOTE — Telephone Encounter (Signed)
Pt's medication was sent to pt's pharmacy as requested. Confirmation received.  °

## 2019-05-19 DIAGNOSIS — H33193 Other retinoschisis and retinal cysts, bilateral: Secondary | ICD-10-CM | POA: Diagnosis not present

## 2019-05-19 DIAGNOSIS — H353212 Exudative age-related macular degeneration, right eye, with inactive choroidal neovascularization: Secondary | ICD-10-CM | POA: Diagnosis not present

## 2019-05-19 DIAGNOSIS — H353124 Nonexudative age-related macular degeneration, left eye, advanced atrophic with subfoveal involvement: Secondary | ICD-10-CM | POA: Diagnosis not present

## 2019-05-19 DIAGNOSIS — H35433 Paving stone degeneration of retina, bilateral: Secondary | ICD-10-CM | POA: Diagnosis not present

## 2019-05-26 DIAGNOSIS — Z79899 Other long term (current) drug therapy: Secondary | ICD-10-CM | POA: Diagnosis not present

## 2019-05-26 DIAGNOSIS — B351 Tinea unguium: Secondary | ICD-10-CM | POA: Diagnosis not present

## 2019-05-26 DIAGNOSIS — L821 Other seborrheic keratosis: Secondary | ICD-10-CM | POA: Diagnosis not present

## 2019-05-26 DIAGNOSIS — Z85828 Personal history of other malignant neoplasm of skin: Secondary | ICD-10-CM | POA: Diagnosis not present

## 2019-05-31 IMAGING — DX DG CHEST 2V
2 series · 2 of 2 positions shown · non-contrast
Comparison: 05/12/2018

CLINICAL DATA: Cough

EXAM:
CHEST - 2 VIEW

[dg chest 2 view (1 of 2)]
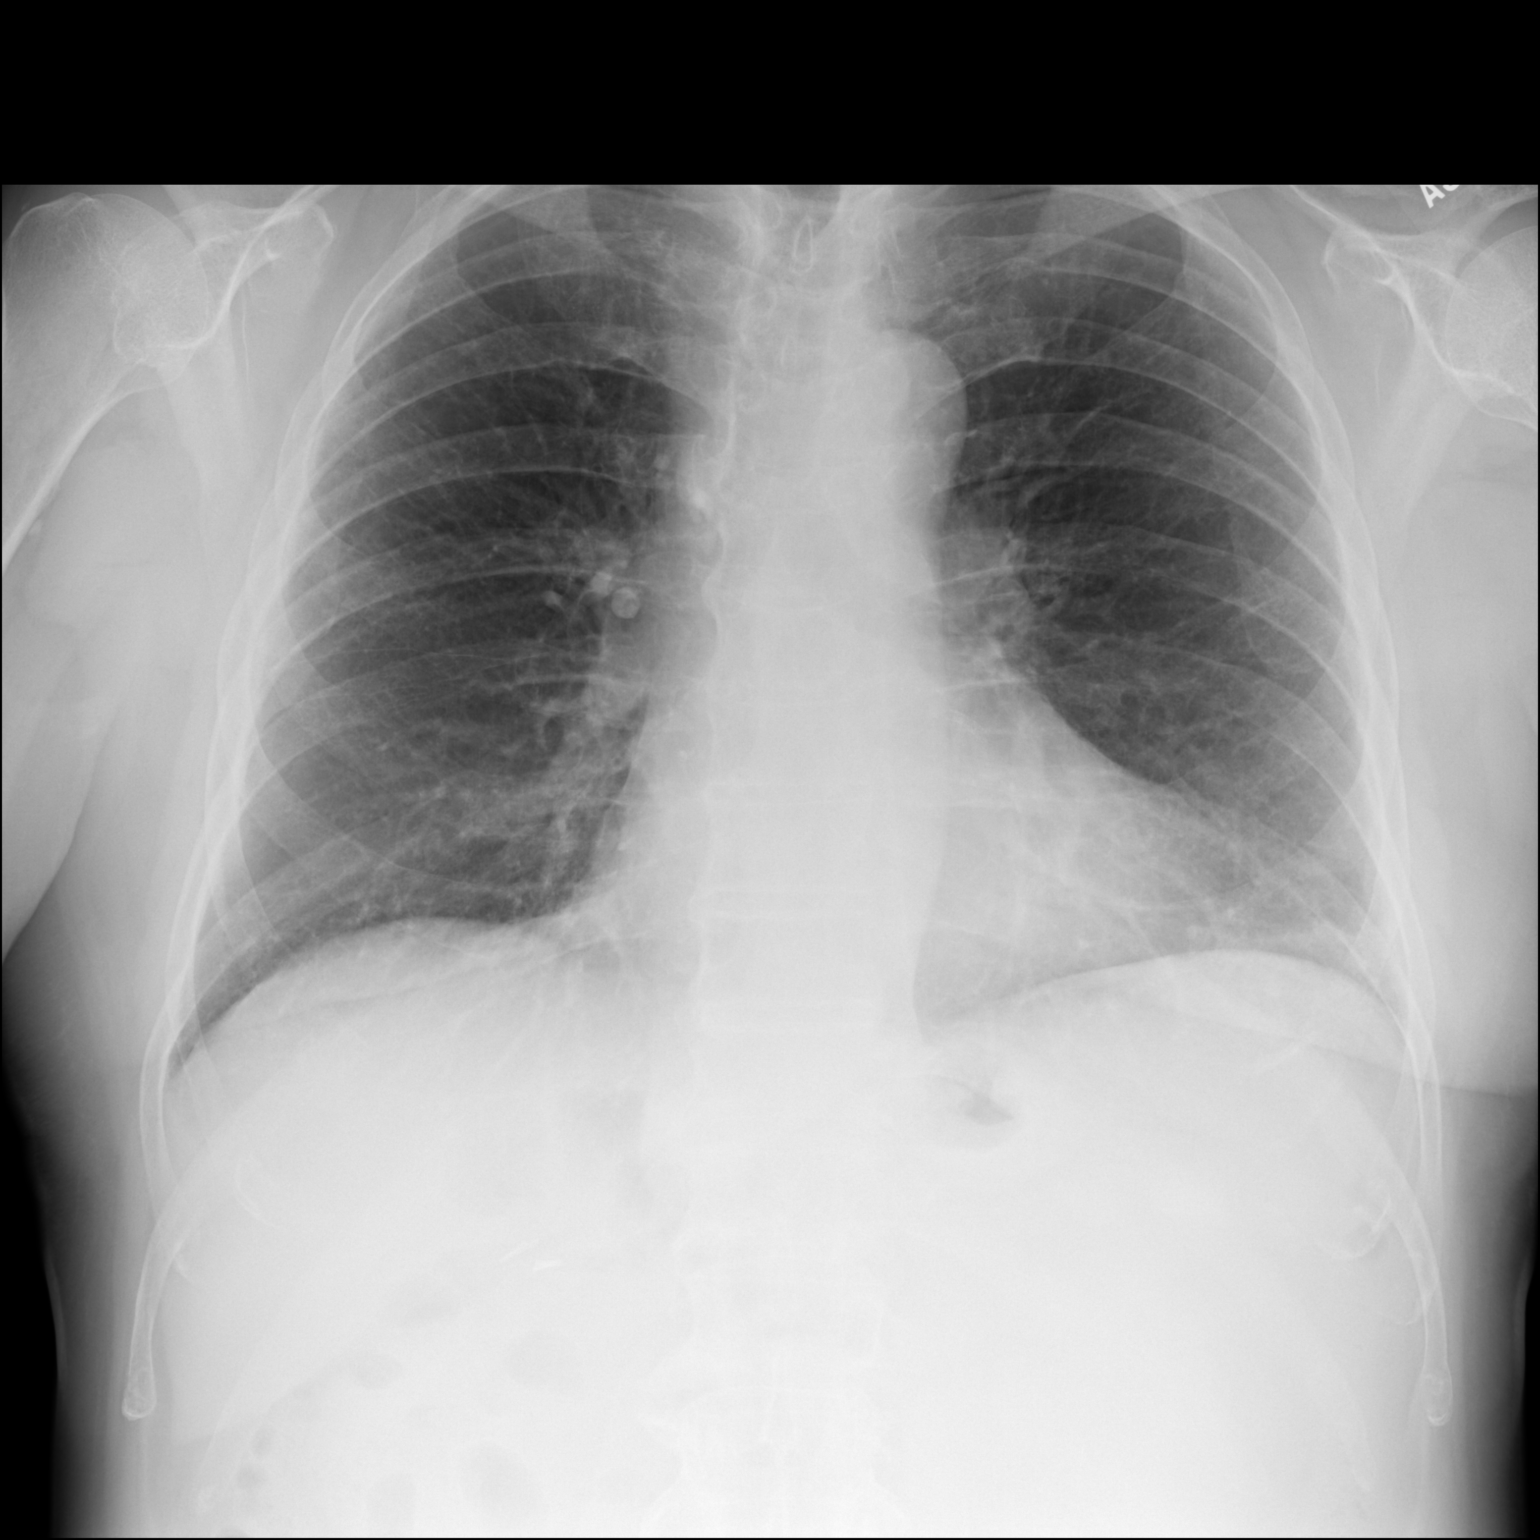

[dg chest 2 view (2 of 2)]
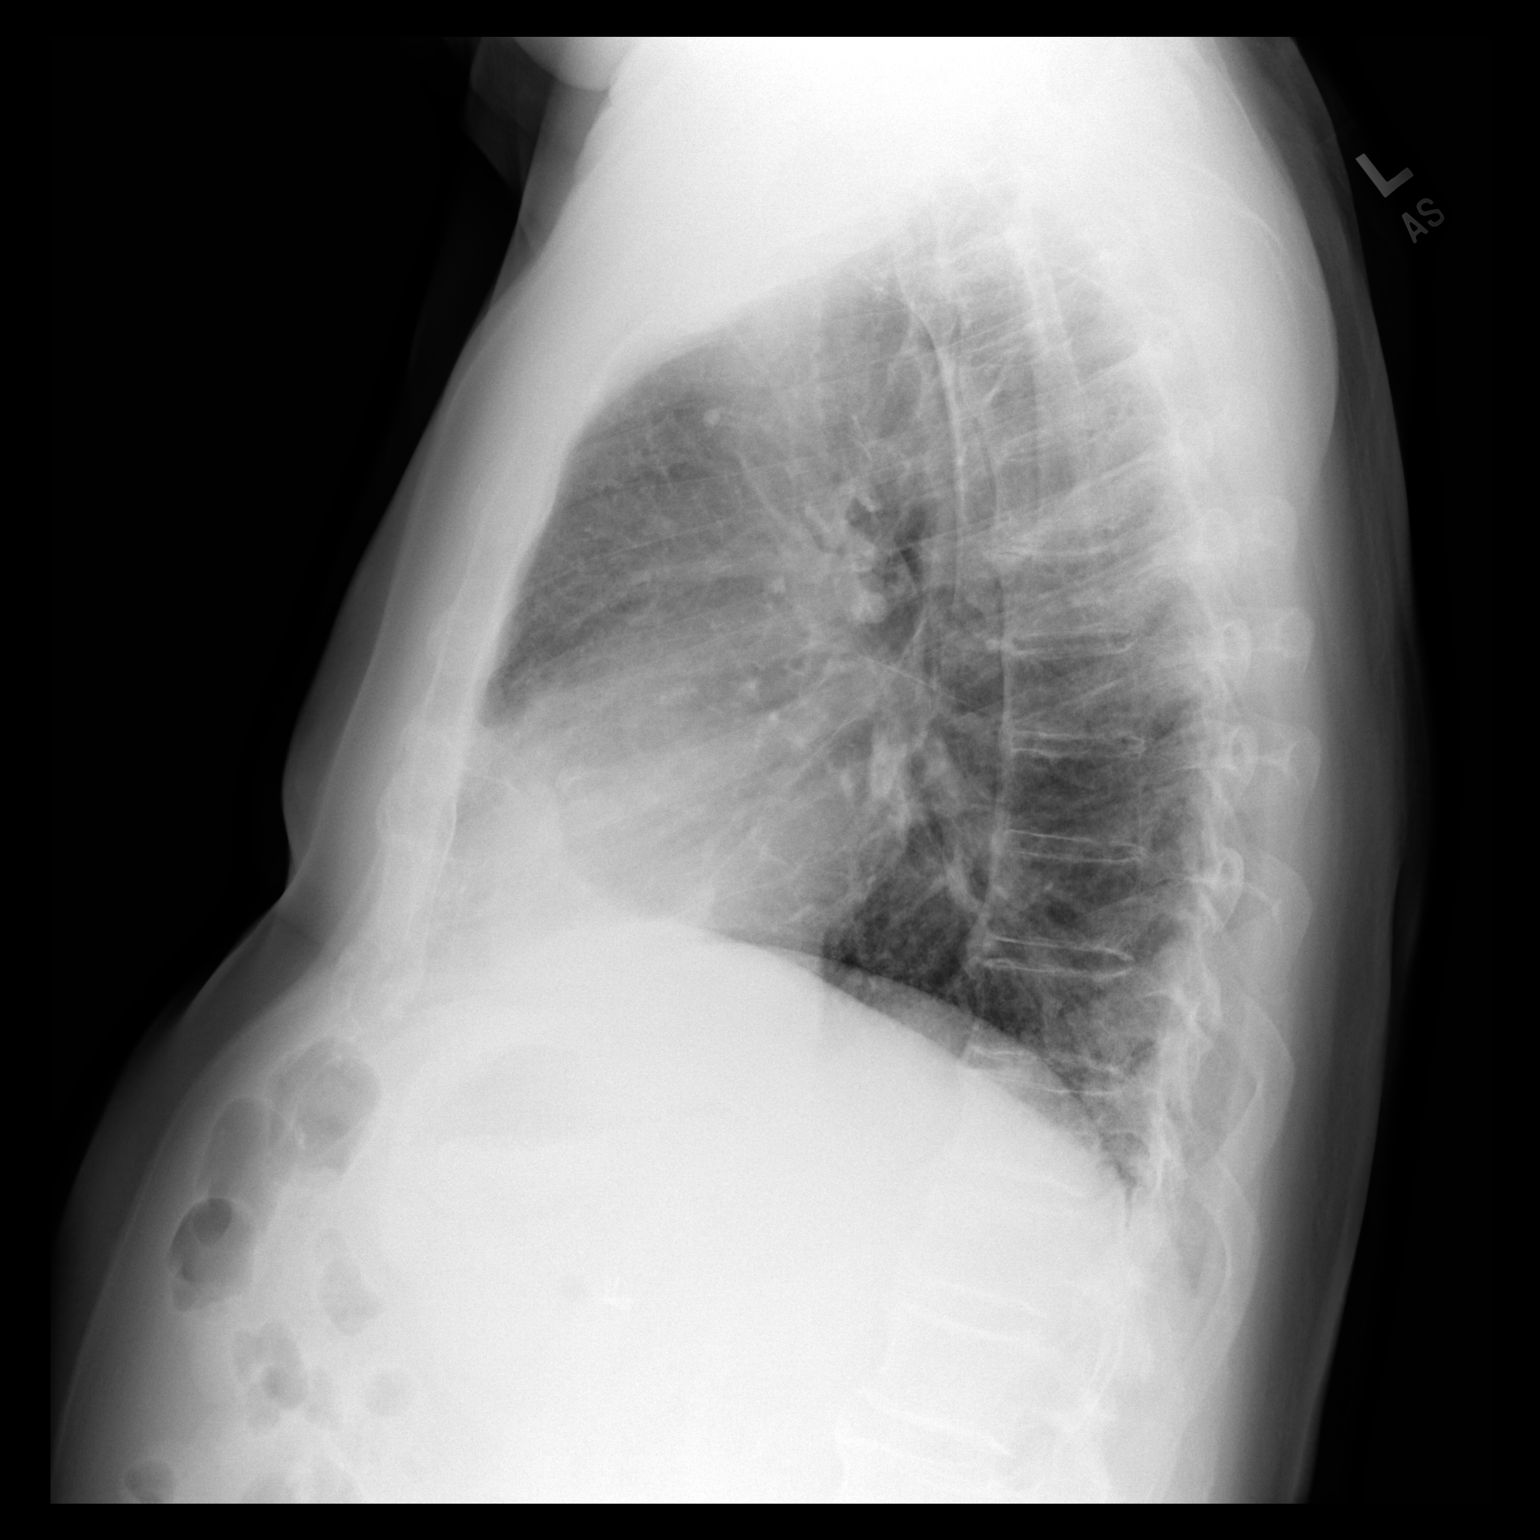

[2 of 2 positions shown; findings below may reference images not displayed]

FINDINGS: The heart size and mediastinal contours are within normal limits.
Both lungs are clear. Lingular scarring or prominent fat pad. The
visualized skeletal structures are unremarkable.
IMPRESSION: No active cardiopulmonary disease.

## 2019-06-02 DIAGNOSIS — M0609 Rheumatoid arthritis without rheumatoid factor, multiple sites: Secondary | ICD-10-CM | POA: Diagnosis not present

## 2019-06-02 DIAGNOSIS — M2550 Pain in unspecified joint: Secondary | ICD-10-CM | POA: Diagnosis not present

## 2019-06-02 DIAGNOSIS — M1009 Idiopathic gout, multiple sites: Secondary | ICD-10-CM | POA: Diagnosis not present

## 2019-06-02 DIAGNOSIS — Z79899 Other long term (current) drug therapy: Secondary | ICD-10-CM | POA: Diagnosis not present

## 2019-06-02 DIAGNOSIS — M159 Polyosteoarthritis, unspecified: Secondary | ICD-10-CM | POA: Diagnosis not present

## 2019-06-02 NOTE — Progress Notes (Signed)
Cardiology Office Note   Date:  06/03/2019   ID:  ANDRO GROSSER, DOB 17-Dec-1933, MRN [id number]  PCP:  Wenda Low, MD  Cardiologist:  Dr. Marlou Porch    Chief Complaint  Patient presents with  . Leg Swelling      History of Present Illness: Connor Hansen is a 85 y.o. male who presents for lower ext edema and increase of lasix for 2-3 days in Sept.  Neg DVT, on Rt chronic occlusive DVGT and Dr. Marlou Porch discussed with Dr. Donzetta Matters and no ned for anticoagualiton ASA continue and TED hose.   Several years ago he had a myocardial infarction treated with thrombolysis. Does not recall a heart catheterization at that time. More recently in 2010 was admitted to the hospital via emergency room with equivocal ST segment changes and chest discomfort. Cardiac catheterization in 2010 at that time demonstrated a high grade mid LAD lesion in addition to a 99% subtotal length the mid right coronary artery lesion. It was difficult to ascertain which one was the culprit and a ventriculogram was done prior to intervention that demonstrated a modest size apical hypokinesis. Accordingly a stent was directed to the mid LAD lesion, bare-metal stent. The right coronary artery was also stented with a bare-metal stent at that time because it was felt that this lesion may occlude in a short period of time. This was done in February of 2010 with procedure performed at [facility].  1992 MI in [place].  Has established care here. Echocardiogram done on Jan 03, 2009 shows mild LVH, normal EF, distal anteroseptal and apical akinesis consistent with myocardial infarction, mild aortic sclerosis. Trivial mitral regurgitation and tricuspid regurgitation.  Overall his LDL has been 78, well controlled on atorvastatin 80. We went over goals. His hypertension is also well controlled. He occasionally will feel palpitations but this is quite rare. He has been having some back pain, this limits his playing of golf.   09/27/15 - No  chest pain, no anginal symptoms. Doing well. Macular degeneration. Prior cataract surgery. No symptoms from bradycardia.  12/12/16-overall he is doing very well. Still playing golf. No active exertional symptoms. Sometimes he has mild shortness of breath with increased activity. Twice a year he can feel his heart race but this is very rare. No chest pain.  04/06/18 - BP has never been high but last few visits it has been high.  He has had some back issues recently, mild back pain.  He has been walking.  No chest pain fevers chills nausea vomiting shortness of breath.  04/14/2019- here for follow-up of CAD.  Still complaining of some lower extremity edema.  Prior visits has had some back pain.  His prednisone certainly could be playing a role in this.  No fevers chills nausea vomiting syncope bleeding.  Sore legs. 3 months. Edema bilat. Prednisone tapering off, now off 1 month.  He had been on the prednisone for many years without any lower extremity edema.  Unusual for him.  He has been sitting a lot more since COVID-19.  No unusual clotting disorder previously.  Calves are tender he states. He had neg DVT.  See above.    Today back for edema. His echo with EF 40-45%, mild to moderate reduced and old MI noted.  He is on BB and ARB.  He does continue with lower ext edema.  though  Currently not severe.  We discussed chronic DVt and no need for anticoagulation.  Again asked to wear support  stockings, decrease salt which he does and that he may need lasix for long term.  Discussed referral to Dr. Donzetta Matters.  No chest pain or SOB.   Past Medical History:  Diagnosis Date  . CAD (coronary artery disease)    2/10 - BMS to mid LAD, RCA. Anteroapical akinesis - normal EF. 2010  . CKD (chronic kidney disease)   . Coronary atherosclerosis of native coronary artery   . GERD (gastroesophageal reflux disease)   . HTN (hypertension)   . Hyperlipidemia   . Hypothyroidism   . Old myocardial infarct   . RA  (rheumatoid arthritis) ([place])     Past Surgical History:  Procedure Laterality Date  . CHOLECYSTECTOMY    . HEMILAMINOTOMY LUMBAR SPINE       Current Outpatient Medications  Medication Sig Dispense Refill  . allopurinol (ZYLOPRIM) 100 MG tablet Take 100 mg by mouth daily.    Marland Kitchen aspirin 81 MG tablet Take 81 mg by mouth daily.    Marland Kitchen atorvastatin (LIPITOR) 80 MG tablet Take 80 mg by mouth daily.    . calcium-vitamin D (CALCIUM 500+D HIGH POTENCY) 500-400 MG-UNIT tablet Take 1 tablet by mouth daily.    . carvedilol (COREG) 6.25 MG tablet Take 1 tablet (6.25 mg total) by mouth 2 (two) times daily with a meal. 180 tablet 3  . Cholecalciferol (VITAMIN D) 400 UNITS capsule Take 400 Units by mouth daily.    Marland Kitchen etanercept (ENBREL SURECLICK) 50 MG/ML injection Inject 50 mg into the skin once a week.    . famotidine (PEPCID) 20 MG tablet Take 20 mg by mouth 2 (two) times daily.    . folic acid (FOLVITE) 1 MG tablet Take 2 mg by mouth 2 (two) times daily.    Marland Kitchen leflunomide (ARAVA) 20 MG tablet Take 1 tablet by mouth at bedtime.    Marland Kitchen levothyroxine (SYNTHROID, LEVOTHROID) 75 MCG tablet Take 75 mcg by mouth daily before breakfast.    . losartan (COZAAR) 50 MG tablet Take 50 mg by mouth daily.    . methotrexate (RHEUMATREX) 2.5 MG tablet Take 20 mg by mouth once a week. Caution:Chemotherapy. Protect from light.    . Multiple Vitamin (MULTIVITAMIN) tablet Take 1 tablet by mouth daily.    . Multiple Vitamins-Minerals (PRESERVISION/LUTEIN PO) Take 500 mg by mouth daily.    . Omega-3 Fatty Acids (FISH OIL PO) Take 1,000 capsules by mouth daily.    Marland Kitchen omeprazole (PRILOSEC) 20 MG capsule Take 20 mg by mouth daily.    . traMADol (ULTRAM) 50 MG tablet Take 50 mg by mouth as needed.    . furosemide (LASIX) 20 MG tablet Take 1 tablet by mouth on Monday, Tuesday, Thursday, Friday, Saturday, and Sunday; take 2 tablets by mouth on Wednesday. 96 tablet 1   No current facility-administered medications for this visit.      Allergies:   Patient has no known allergies.    Social History:  The patient  reports that he has quit smoking. His smoking use included cigarettes. He quit after 25.00 years of use. He has never used smokeless tobacco. He reports current alcohol use. He reports that he does not use drugs.   Family History:  The patient's family history includes Cancer in his father.    ROS:  General:no colds or fevers, + 5 lb weight loss Skin:no rashes or ulcers HEENT:no blurred vision, no congestion CV:see HPI PUL:see HPI GI:no diarrhea constipation or melena, no indigestion GU:no hematuria, no dysuria MS:no joint pain, no  claudication Neuro:no syncope, no lightheadedness Endo:no diabetes, + thyroid disease  Wt Readings from Last 3 Encounters:  06/03/19 189 lb 3.2 oz (85.8 kg)  04/14/19 194 lb 6.4 oz (88.2 kg)  04/06/18 190 lb (86.2 kg)     PHYSICAL EXAM: VS:  BP 126/70   Pulse 60   Ht 5\' 9"  (1.753 m)   Wt 189 lb 3.2 oz (85.8 kg)   BMI 27.94 kg/m  , BMI Body mass index is 27.94 kg/m. General:Pleasant affect, NAD Skin:Warm and dry, brisk capillary refill HEENT:normocephalic, sclera clear, mucus membranes moist Neck:supple, no JVD, no bruits  Heart:S1S2 RRR without murmur, gallup, rub or click Lungs:clear without rales, rhonchi, or wheezes [id number], non tender, + BS, do not palpate liver spleen or masses Ext:no lower ext edema, 2+ pedal pulses, 2+ radial pulses Neuro:alert and oriented, MAE, follows commands, + facial symmetry    EKG:  EKG is NOT ordered today.  Recent Labs: 04/14/2019: BUN 8; Creatinine, Ser 0.98; Potassium 4.3; Sodium 142    Lipid Panel No results found for: CHOL, TRIG, HDL, CHOLHDL, VLDL, LDLCALC, LDLDIRECT     Other studies Reviewed: Additional studies/ records that were reviewed today include: . Echo 04/27/19 IMPRESSIONS    1. The left ventricle has mild-moderately reduced systolic function, with an ejection fraction of 40-45%. The cavity size was  normal. There is mildly increased left ventricular wall thickness. Left ventricular diastolic Doppler parameters are consistent  with impaired relaxation.  2. The right ventricle has normal systolic function. The cavity was normal.  3. The tricuspid valve is grossly normal.  4. The aortic valve is tricuspid. Mild thickening of the aortic valve. No stenosis of the aortic valve.  5. The aorta is normal unless otherwise noted.  6. Definity used; akinesis of the distal anteroseptal wall and apex; overall mild to moderate LV dysfunction; grade 1 diastolic dysfunction; mild LVH.  FINDINGS  Left Ventricle: The left ventricle has mild-moderately reduced systolic function, with an ejection fraction of 40-45%. The cavity size was normal. There is mildly increased left ventricular wall thickness. Left ventricular diastolic Doppler parameters  are consistent with impaired relaxation. Definity contrast agent was given IV to delineate the left ventricular endocardial borders.  Right Ventricle: The right ventricle has normal systolic function. The cavity was normal.  Left Atrium: Left atrial size was normal in size.  Right Atrium: Right atrial size was normal in size.  Interatrial Septum: No atrial level shunt detected by color flow Doppler.  Pericardium: There is no evidence of pericardial effusion.  Mitral Valve: The mitral valve is normal in structure. Mitral valve regurgitation is not visualized by color flow Doppler.  Tricuspid Valve: The tricuspid valve is grossly normal. Tricuspid valve regurgitation is trivial by color flow Doppler.  Aortic Valve: The aortic valve is tricuspid Mild thickening of the aortic valve. Aortic valve regurgitation was not visualized by color flow Doppler. There is No stenosis of the aortic valve.  Pulmonic Valve: The pulmonic valve was grossly normal. Pulmonic valve regurgitation is not visualized by color flow Doppler.  Aorta: The aorta is normal unless  otherwise noted.  Venous: The inferior vena cava is normal in size with greater than 50% respiratory variability.  Additional Comments: Definity used; akinesis of the distal anteroseptal wall and apex; overall mild to moderate LV dysfunction; grade 1 diastolic dysfunction; mild LVH.    [facility] doppler 04/27/19  Summary: Right: Findings consistent with chronic deep vein thrombosis involving the right peroneal veins. No cystic structure found in  the popliteal fossa. Dilatation is noted of one of the paired peroneal veins, measuring .90 cm. All other veins visualized  appear fully compressible and demonstrate appropriate Doppler characteristics.   Left: No evidence of deep vein thrombosis in the lower extremity. No indirect evidence of obstruction proximal to the inguinal ligament. No cystic structure found in the popliteal fossa.    ASSESSMENT AND PLAN:  1.  Lower ext edema bil.  + tenderness of ankles, venous dopplers as above and no need for anticoagulation.  Continue ASA.  Continue lasix 20 daily, and once a week on Wed may take an extra lasix.  Will check BMP today. If edema does not improved will refer to Dr. Donzetta Matters.   2.  CAD with prior PCI to LAD, no angina.    3.  HLD, with excellent control.      Current medicines are reviewed with the patient today.  The patient Has no concerns regarding medicines.  The following changes have been made:  See above Labs/ tests ordered today include:see above  Disposition:   FU:  see above  Signed, Cecilie Kicks, NP  06/03/2019 3:08 PM    [facility] [place], [place], [place], [place] Phone: [phone]; Fax: [phone]

## 2019-06-03 ENCOUNTER — Ambulatory Visit: Payer: PPO | Admitting: Cardiology

## 2019-06-03 ENCOUNTER — Other Ambulatory Visit: Payer: Self-pay

## 2019-06-03 ENCOUNTER — Encounter: Payer: Self-pay | Admitting: Cardiology

## 2019-06-03 ENCOUNTER — Telehealth: Payer: Self-pay

## 2019-06-03 VITALS — BP 126/70 | HR 60 | Ht 69.0 in | Wt 189.2 lb

## 2019-06-03 DIAGNOSIS — I2510 Atherosclerotic heart disease of native coronary artery without angina pectoris: Secondary | ICD-10-CM

## 2019-06-03 DIAGNOSIS — I208 Other forms of angina pectoris: Secondary | ICD-10-CM

## 2019-06-03 DIAGNOSIS — E782 Mixed hyperlipidemia: Secondary | ICD-10-CM | POA: Diagnosis not present

## 2019-06-03 DIAGNOSIS — I2583 Coronary atherosclerosis due to lipid rich plaque: Secondary | ICD-10-CM

## 2019-06-03 DIAGNOSIS — E7800 Pure hypercholesterolemia, unspecified: Secondary | ICD-10-CM

## 2019-06-03 DIAGNOSIS — R600 Localized edema: Secondary | ICD-10-CM

## 2019-06-03 LAB — BASIC METABOLIC PANEL
BUN/Creatinine Ratio: 11 (ref 10–24)
BUN: 11 mg/dL (ref 8–27)
CO2: 24 mmol/L (ref 20–29)
Calcium: 9 mg/dL (ref 8.6–10.2)
Chloride: 107 mmol/L — ABNORMAL HIGH (ref 96–106)
Creatinine, Ser: 1.02 mg/dL (ref 0.76–1.27)
GFR calc Af Amer: 78 mL/min/{1.73_m2} (ref >59)
GFR calc non Af Amer: 68 mL/min/{1.73_m2} (ref >59)
Glucose: 97 mg/dL (ref 65–99)
Potassium: 4.4 mmol/L (ref 3.5–5.2)
Sodium: 143 mmol/L (ref 134–144)

## 2019-06-03 MED ORDER — FUROSEMIDE 20 MG PO TABS: ORAL_TABLET | ORAL | 1 refills | Status: DC

## 2019-06-03 MED ORDER — FUROSEMIDE 20 MG PO TABS: 20.0000 mg | ORAL_TABLET | ORAL | 1 refills | Status: DC

## 2019-06-03 NOTE — Telephone Encounter (Signed)
Tried to [employer], it was printed. New telephone encounter started and RX was eprescribed.

## 2019-06-03 NOTE — Patient Instructions (Signed)
Medication Instructions:   Your physician has recommended you make the following change in your medication:   Continue Furosemide 20MG  1 tablet by mouth once a day, except on Wednesday take 40MG  (2 tablets) by mouth.  *If you need a refill on your cardiac medications before your next appointment, please call your pharmacy*  Lab Work:  You will have labs drawn today: BMET  If you have labs (blood work) drawn today and your tests are completely normal, you will receive your results only by: Marland Kitchen MyChart Message (if you have MyChart) OR . A paper copy in the mail If you have any lab test that is abnormal or we need to change your treatment, we will call you to review the results.  Testing/Procedures:  None ordered today  Follow-Up: At [facility], you and your health needs are our priority.  As part of our continuing mission to provide you with exceptional heart care, we have created designated Provider Care Teams.  These Care Teams include your primary Cardiologist (physician) and Advanced Practice Providers (APPs -  Physician Assistants and Nurse Practitioners) who all work together to provide you with the care you need, when you need it.  Your next appointment:    08/04/19 at 8:00AM with Cecilie Kicks, NP

## 2019-06-03 NOTE — Telephone Encounter (Signed)
New RX for Lasix sent to pharmacy since medication was changed at today's office visit with Cecilie Kicks.

## 2019-06-30 DIAGNOSIS — D72819 Decreased white blood cell count, unspecified: Secondary | ICD-10-CM | POA: Diagnosis not present

## 2019-08-03 NOTE — Progress Notes (Deleted)
Cardiology Office Note   Date:  08/03/2019   ID:  Connor Hansen, DOB [birth year], MRN [id number]  PCP:  Wenda Low, MD  Cardiologist:  ***    No chief complaint on file.     History of Present Illness: Connor Hansen is a 85 y.o. male who presents for ***   lower ext edema and increase of lasix for 2-3 days in Sept.  Neg DVT, on Rt chronic occlusive DVGT and Dr. Marlou Porch discussed with Dr. Donzetta Matters and no ned for anticoagualiton ASA continue and TED hose.   Several years ago he had a myocardial infarction treated with thrombolysis. Does not recall a heart catheterization at that time. More recently in 2010 was admitted to the hospital via emergency room with equivocal ST segment changes and chest discomfort. Cardiac catheterization in 2010at that time demonstrated ahigh grade mid LAD lesion in addition to a 99% subtotal length the mid right coronary artery lesion. It was difficult to ascertain which one was the culprit and a ventriculogram was done prior to intervention that demonstrated a modest size apical hypokinesis. Accordingly astent was directed to the mid LAD lesion, bare-metal stent. The right coronary arterywas also stented with a bare-metal stent at that time because it was felt that this lesion may occlude in a short period of time. This was done in February of 2010 with procedure performed at [facility].  1992 MI in [place].  Has established care here. Echocardiogram done on Jan 03, 2009 shows mild LVH, normal EF, distal anteroseptal and apical akinesis consistent with myocardial infarction, mild aortic sclerosis. Trivial mitral regurgitation and tricuspid regurgitation.  Overall his LDL has been 78, well controlled on atorvastatin 80. We went over goals. His hypertension is also well controlled. He occasionally will feel palpitations but this is quite rare. He has been having some back pain, this limits his playing of golf.  09/27/15 -No chest pain, no anginal  symptoms. Doing well. Macular degeneration. Prior cataract surgery. No symptoms from bradycardia.  12/12/16-overall he is doing very well. Still playing golf. No active exertional symptoms. Sometimes he has mild shortness of breath with increased activity. Twice a year he can feel his heart race but this is very rare. No chest pain.  04/06/18 - BP has never been high but last few visits it has been high. He has had some back issues recently, mild back pain. He has been walking. No chest pain fevers chills nausea vomiting shortness of breath.  04/14/2019-here for follow-up of CAD. Still complaining of some lower extremity edema. Prior visits has had some back pain. His prednisone certainly could be playing a role in this. No fevers chills nausea vomiting syncope bleeding.  Sore legs. 3 months. Edema bilat. Prednisone tapering off, now off 1 month.He had been on the prednisone for many years without any lower extremity edema. Unusual for him. He has been sitting a lot more since COVID-19. No unusual clotting disorder previously. Calves are tender he states. He had neg DVT.  See above.    Today back for edema. His echo with EF 40-45%, mild to moderate reduced and old MI noted.  He is on BB and ARB.  He does continue with lower ext edema.  though  Currently not severe.  We discussed chronic DVt and no need for anticoagulation.  Again asked to wear support stockings, decrease salt which he does and that he may need lasix for long term.  Discussed referral to Dr. Donzetta Matters.  No chest  pain or SOB.  Lasix 20 daily and once a week 40 mg.   Past Medical History:  Diagnosis Date  . CAD (coronary artery disease)    2/10 - BMS to mid LAD, RCA. Anteroapical akinesis - normal EF. 2010  . CKD (chronic kidney disease)   . Coronary atherosclerosis of native coronary artery   . GERD (gastroesophageal reflux disease)   . HTN (hypertension)   . Hyperlipidemia   . Hypothyroidism   . Old myocardial infarct    . RA (rheumatoid arthritis) ([place])     Past Surgical History:  Procedure Laterality Date  . CHOLECYSTECTOMY    . HEMILAMINOTOMY LUMBAR SPINE       Current Outpatient Medications  Medication Sig Dispense Refill  . allopurinol (ZYLOPRIM) 100 MG tablet Take 100 mg by mouth daily.    Marland Kitchen aspirin 81 MG tablet Take 81 mg by mouth daily.    Marland Kitchen atorvastatin (LIPITOR) 80 MG tablet Take 80 mg by mouth daily.    . calcium-vitamin D (CALCIUM 500+D HIGH POTENCY) 500-400 MG-UNIT tablet Take 1 tablet by mouth daily.    . carvedilol (COREG) 6.25 MG tablet Take 1 tablet (6.25 mg total) by mouth 2 (two) times daily with a meal. 180 tablet 3  . Cholecalciferol (VITAMIN D) 400 UNITS capsule Take 400 Units by mouth daily.    Marland Kitchen etanercept (ENBREL SURECLICK) 50 MG/ML injection Inject 50 mg into the skin once a week.    . famotidine (PEPCID) 20 MG tablet Take 20 mg by mouth 2 (two) times daily.    . folic acid (FOLVITE) 1 MG tablet Take 2 mg by mouth 2 (two) times daily.    . furosemide (LASIX) 20 MG tablet Take 1 tablet by mouth on Monday, Tuesday, Thursday, Friday, Saturday, and Sunday; take 2 tablets by mouth on Wednesday. 96 tablet 1  . leflunomide (ARAVA) 20 MG tablet Take 1 tablet by mouth at bedtime.    Marland Kitchen levothyroxine (SYNTHROID, LEVOTHROID) 75 MCG tablet Take 75 mcg by mouth daily before breakfast.    . losartan (COZAAR) 50 MG tablet Take 50 mg by mouth daily.    . methotrexate (RHEUMATREX) 2.5 MG tablet Take 20 mg by mouth once a week. Caution:Chemotherapy. Protect from light.    . Multiple Vitamin (MULTIVITAMIN) tablet Take 1 tablet by mouth daily.    . Multiple Vitamins-Minerals (PRESERVISION/LUTEIN PO) Take 500 mg by mouth daily.    . Omega-3 Fatty Acids (FISH OIL PO) Take 1,000 capsules by mouth daily.    Marland Kitchen omeprazole (PRILOSEC) 20 MG capsule Take 20 mg by mouth daily.    . traMADol (ULTRAM) 50 MG tablet Take 50 mg by mouth as needed.     No current facility-administered medications for this  visit.    Allergies:   Patient has no known allergies.    Social History:  The patient  reports that he has quit smoking. His smoking use included cigarettes. He quit after 25.00 years of use. He has never used smokeless tobacco. He reports current alcohol use. He reports that he does not use drugs.   Family History:  The patient's ***family history includes Cancer in his father.    ROS:  General:no colds or fevers, no weight changes Skin:no rashes or ulcers HEENT:no blurred vision, no congestion CV:see HPI PUL:see HPI GI:no diarrhea constipation or melena, no indigestion GU:no hematuria, no dysuria MS:no joint pain, no claudication Neuro:no syncope, no lightheadedness Endo:no diabetes, no thyroid disease Wt Readings from Last 3 Encounters:  06/03/19 189 lb 3.2 oz (85.8 kg)  04/14/19 194 lb 6.4 oz (88.2 kg)  04/06/18 190 lb (86.2 kg)     PHYSICAL EXAM: VS:  There were no vitals taken for this visit. , BMI There is no height or weight on file to calculate BMI. General:Pleasant affect, NAD Skin:Warm and dry, brisk capillary refill HEENT:normocephalic, sclera clear, mucus membranes moist Neck:supple, no JVD, no bruits  Heart:S1S2 RRR without murmur, gallup, rub or click Lungs:clear without rales, rhonchi, or wheezes [id number], non tender, + BS, do not palpate liver spleen or masses Ext:no lower ext edema, 2+ pedal pulses, 2+ radial pulses Neuro:alert and oriented, MAE, follows commands, + facial symmetry    EKG:  EKG is ordered today. The ekg ordered today demonstrates ***   Recent Labs: 06/03/2019: BUN 11; Creatinine, Ser 1.02; Potassium 4.4; Sodium 143    Lipid Panel No results found for: CHOL, TRIG, HDL, CHOLHDL, VLDL, LDLCALC, LDLDIRECT     Other studies Reviewed: Additional studies/ records that were reviewed today include: ***.   ASSESSMENT AND PLAN:  1.  ***   Current medicines are reviewed with the patient today.  The patient Has no concerns  regarding medicines.  The following changes have been made:  See above Labs/ tests ordered today include:see above  Disposition:   FU:  see above  Signed, Cecilie Kicks, NP  08/03/2019 9:59 PM    [facility] [place], [place], [place] Phone: [phone]; Fax: [phone]

## 2019-08-04 ENCOUNTER — Ambulatory Visit: Payer: PPO | Admitting: Cardiology

## 2019-08-17 DIAGNOSIS — H33193 Other retinoschisis and retinal cysts, bilateral: Secondary | ICD-10-CM | POA: Diagnosis not present

## 2019-08-17 DIAGNOSIS — H35423 Microcystoid degeneration of retina, bilateral: Secondary | ICD-10-CM | POA: Diagnosis not present

## 2019-08-17 DIAGNOSIS — H353232 Exudative age-related macular degeneration, bilateral, with inactive choroidal neovascularization: Secondary | ICD-10-CM | POA: Diagnosis not present

## 2019-08-17 DIAGNOSIS — H35372 Puckering of macula, left eye: Secondary | ICD-10-CM | POA: Diagnosis not present

## 2019-08-25 ENCOUNTER — Other Ambulatory Visit: Payer: Self-pay

## 2019-08-25 ENCOUNTER — Encounter: Payer: Self-pay | Admitting: Cardiology

## 2019-08-25 ENCOUNTER — Ambulatory Visit: Payer: PPO | Admitting: Cardiology

## 2019-08-25 VITALS — BP 126/70 | HR 60 | Ht 69.0 in | Wt 186.0 lb

## 2019-08-25 DIAGNOSIS — R600 Localized edema: Secondary | ICD-10-CM | POA: Diagnosis not present

## 2019-08-25 DIAGNOSIS — I2583 Coronary atherosclerosis due to lipid rich plaque: Secondary | ICD-10-CM | POA: Diagnosis not present

## 2019-08-25 DIAGNOSIS — I208 Other forms of angina pectoris: Secondary | ICD-10-CM | POA: Diagnosis not present

## 2019-08-25 DIAGNOSIS — I2510 Atherosclerotic heart disease of native coronary artery without angina pectoris: Secondary | ICD-10-CM

## 2019-08-25 NOTE — Patient Instructions (Signed)
Medication Instructions:   Your physician recommends that you continue on your current medications as directed. Please refer to the Current Medication list given to you today.   *If you need a refill on your cardiac medications before your next appointment, please call your pharmacy*  Lab Work:  BMET TODAY   If you have labs (blood work) drawn today and your tests are completely normal, you will receive your results only by: Marland Kitchen MyChart Message (if you have MyChart) OR . A paper copy in the mail If you have any lab test that is abnormal or we need to change your treatment, we will call you to review the results.  Testing/Procedures: NONE ORDERED  TODAY   Follow-Up: At [facility], you and your health needs are our priority.  As part of our continuing mission to provide you with exceptional heart care, we have created designated Provider Care Teams.  These Care Teams include your primary Cardiologist (physician) and Advanced Practice Providers (APPs -  Physician Assistants and Nurse Practitioners) who all work together to provide you with the care you need, when you need it.  Your next appointment:   3 month(s)  The format for your next appointment:   In Person  Provider:   You may see Candee Furbish, MD or one of the following Advanced Practice Providers on your designated Care Team:    Truitt Merle, NP  Cecilie Kicks, NP  Kathyrn Drown, NP   Other Instructions

## 2019-08-25 NOTE — Progress Notes (Signed)
Cardiology Office Note   Date:  08/25/2019   ID:  Connor Hansen, DOB Apr 25, 1934, MRN [id number]  PCP:  Wenda Low, MD  Cardiologist:  Dr. Marlou Porch    Chief Complaint  Patient presents with  . Leg Swelling      History of Present Illness: Connor Hansen is a 86 y.o. male who presents for leg edema followup  for lower ext edema and increase of lasix for 2-3 days in Sept.  Neg DVT, on Rt chronic occlusive DVGT and Dr. Marlou Porch discussed with Dr. Donzetta Matters and no ned for anticoagualiton ASA continue and TED hose.   Several years ago he had a myocardial infarction treated with thrombolysis. Does not recall a heart catheterization at that time. More recently in 2010 was admitted to the hospital via emergency room with equivocal ST segment changes and chest discomfort. Cardiac catheterization in 2010at that time demonstrated ahigh grade mid LAD lesion in addition to a 99% subtotal length the mid right coronary artery lesion. It was difficult to ascertain which one was the culprit and a ventriculogram was done prior to intervention that demonstrated a modest size apical hypokinesis. Accordingly astent was directed to the mid LAD lesion, bare-metal stent. The right coronary arterywas also stented with a bare-metal stent at that time because it was felt that this lesion may occlude in a short period of time. This was done in February of 2010 with procedure performed at [facility].  1992 MI in [place].  Has established care here. Echocardiogram done on Jan 03, 2009 shows mild LVH, normal EF, distal anteroseptal and apical akinesis consistent with myocardial infarction, mild aortic sclerosis. Trivial mitral regurgitation and tricuspid regurgitation.  Overall his LDL has been 78, well controlled on atorvastatin 80. We went over goals. His hypertension is also well controlled. He occasionally will feel palpitations but this is quite rare. He has been having some back pain, this limits his playing  of golf.  09/27/15 -No chest pain, no anginal symptoms. Doing well. Macular degeneration. Prior cataract surgery. No symptoms from bradycardia.  12/12/16-overall he is doing very well. Still playing golf. No active exertional symptoms. Sometimes he has mild shortness of breath with increased activity. Twice a year he can feel his heart race but this is very rare. No chest pain.  04/06/18 - BP has never been high but last few visits it has been high. He has had some back issues recently, mild back pain. He has been walking. No chest pain fevers chills nausea vomiting shortness of breath.  04/14/2019-here for follow-up of CAD. Still complaining of some lower extremity edema. Prior visits has had some back pain. His prednisone certainly could be playing a role in this. No fevers chills nausea vomiting syncope bleeding.  Sore legs. 3 months. Edema bilat. Prednisone tapering off, now off 1 month.He had been on the prednisone for many years without any lower extremity edema. Unusual for him. He has been sitting a lot more since COVID-19. No unusual clotting disorder previously. Calves are tender he states. He had neg DVT.  See above.    06/03/19 was back for edema. His echo with EF 40-45%, mild to moderate reduced and old MI noted.  He is on BB and ARB.  He does continue with lower ext edema.  though  Currently not severe.  We discussed chronic DVt and no need for anticoagulation.  Again asked to wear support stockings, decrease salt which he does and that he may need lasix for long  term.  Discussed referral to Dr. Donzetta Matters.  No chest pain or SOB.  Today on lasix daily of 20 mg and on every Wed 40 mg.    If no improvement will refer to Dr. Donzetta Matters.  But pt is doing better, no SOB or chest pain and no edema, it begins to fill up with slight edema but lasix on Wed takes care of it.  He has burning at times in feet prob neuropathy and will check with rheumatology.  Otherwise no complaints.  To have  vaccine on the 22 nd.  For COVID     Past Medical History:  Diagnosis Date  . CAD (coronary artery disease)    2/10 - BMS to mid LAD, RCA. Anteroapical akinesis - normal EF. 2010  . CKD (chronic kidney disease)   . Coronary atherosclerosis of native coronary artery   . GERD (gastroesophageal reflux disease)   . HTN (hypertension)   . Hyperlipidemia   . Hypothyroidism   . Old myocardial infarct   . RA (rheumatoid arthritis) ([place])     Past Surgical History:  Procedure Laterality Date  . CHOLECYSTECTOMY    . HEMILAMINOTOMY LUMBAR SPINE       Current Outpatient Medications  Medication Sig Dispense Refill  . allopurinol (ZYLOPRIM) 100 MG tablet Take 100 mg by mouth daily.    Marland Kitchen aspirin 81 MG tablet Take 81 mg by mouth daily.    Marland Kitchen atorvastatin (LIPITOR) 80 MG tablet Take 80 mg by mouth daily.    . calcium-vitamin D (CALCIUM 500+D HIGH POTENCY) 500-400 MG-UNIT tablet Take 1 tablet by mouth daily.    . carvedilol (COREG) 6.25 MG tablet Take 1 tablet (6.25 mg total) by mouth 2 (two) times daily with a meal. 180 tablet 3  . etanercept (ENBREL SURECLICK) 50 MG/ML injection Inject 50 mg into the skin once a week.    . famotidine (PEPCID) 20 MG tablet Take 20 mg by mouth 2 (two) times daily.    . folic acid (FOLVITE) 1 MG tablet Take 2 mg by mouth 2 (two) times daily.    . furosemide (LASIX) 20 MG tablet Take 1 tablet by mouth on Monday, Tuesday, Thursday, Friday, Saturday, and Sunday; take 2 tablets by mouth on Wednesday. 96 tablet 1  . leflunomide (ARAVA) 20 MG tablet Take 1 tablet by mouth at bedtime.    Marland Kitchen levothyroxine (SYNTHROID, LEVOTHROID) 75 MCG tablet Take 75 mcg by mouth daily before breakfast.    . losartan (COZAAR) 50 MG tablet Take 50 mg by mouth daily.    . methotrexate (RHEUMATREX) 2.5 MG tablet Take 20 mg by mouth once a week. Caution:Chemotherapy. Protect from light.    . Multiple Vitamin (MULTIVITAMIN) tablet Take 1 tablet by mouth daily.    . Multiple Vitamins-Minerals  (PRESERVISION/LUTEIN PO) Take 500 mg by mouth daily.    . Omega-3 Fatty Acids (FISH OIL PO) Take 1,000 capsules by mouth daily.    Marland Kitchen omeprazole (PRILOSEC) 20 MG capsule Take 20 mg by mouth daily.    . prednisoLONE 5 MG TABS tablet Take 5 mg by mouth daily.    . traMADol (ULTRAM) 50 MG tablet Take 50 mg by mouth as needed.    . Cholecalciferol (VITAMIN D) 400 UNITS capsule Take 400 Units by mouth daily.     No current facility-administered medications for this visit.    Allergies:   Patient has no known allergies.    Social History:  The patient  reports that he has quit smoking.  His smoking use included cigarettes. He quit after 25.00 years of use. He has never used smokeless tobacco. He reports current alcohol use. He reports that he does not use drugs.   Family History:  The patient's family history includes Cancer in his father.    ROS:  General:no colds or fevers, + weight decrease Skin:no rashes or ulcers HEENT:no blurred vision, no congestion CV:see HPI PUL:see HPI GI:no diarrhea constipation or melena, no indigestion GU:no hematuria, no dysuria MS:no joint pain, no claudication Neuro:no syncope, no lightheadedness Endo:no diabetes, no thyroid disease  Wt Readings from Last 3 Encounters:  08/25/19 186 lb (84.4 kg)  06/03/19 189 lb 3.2 oz (85.8 kg)  04/14/19 194 lb 6.4 oz (88.2 kg)     PHYSICAL EXAM: VS:  BP 126/70   Pulse 60   Ht 5\' 9"  (1.753 m)   Wt 186 lb (84.4 kg)   BMI 27.47 kg/m  , BMI Body mass index is 27.47 kg/m. General:Pleasant affect, NAD Skin:Warm and dry, brisk capillary refill HEENT:normocephalic, sclera clear, mucus membranes moist Neck:supple, no JVD, no bruits  Heart:S1S2 RRR without murmur, gallup, rub or click Lungs:clear without rales, rhonchi, or wheezes [id number], non tender, + BS, do not palpate liver spleen or masses Ext:no to trace lower ext edema, 2+ pedal pulses, 2+ radial pulses Neuro:alert and oriented X 3, MAE, follows commands, +  facial symmetry    EKG:  EKG is Not ordered today.    Recent Labs: 06/03/2019: BUN 11; Creatinine, Ser 1.02; Potassium 4.4; Sodium 143    Lipid Panel No results found for: CHOL, TRIG, HDL, CHOLHDL, VLDL, LDLCALC, LDLDIRECT     Other studies Reviewed: Additional studies/ records that were reviewed today include: . Echo 04/27/19 IMPRESSIONS   1. The left ventricle has mild-moderately reduced systolic function, with an ejection fraction of 40-45%. The cavity size was normal. There is mildly increased left ventricular wall thickness. Left ventricular diastolic Doppler parameters are consistent with impaired relaxation. 2. The right ventricle has normal systolic function. The cavity was normal. 3. The tricuspid valve is grossly normal. 4. The aortic valve is tricuspid. Mild thickening of the aortic valve. No stenosis of the aortic valve. 5. The aorta is normal unless otherwise noted. 6. Definity used; akinesis of the distal anteroseptal wall and apex; overall mild to moderate LV dysfunction; grade 1 diastolic dysfunction; mild LVH.  FINDINGS Left Ventricle: The left ventricle has mild-moderately reduced systolic function, with an ejection fraction of 40-45%. The cavity size was normal. There is mildly increased left ventricular wall thickness. Left ventricular diastolic Doppler parameters  are consistent with impaired relaxation. Definity contrast agent was given IV to delineate the left ventricular endocardial borders.  Right Ventricle: The right ventricle has normal systolic function. The cavity was normal.  Left Atrium: Left atrial size was normal in size.  Right Atrium: Right atrial size was normal in size.  Interatrial Septum: No atrial level shunt detected by color flow Doppler.  Pericardium: There is no evidence of pericardial effusion.  Mitral Valve: The mitral valve is normal in structure. Mitral valve regurgitation is not visualized by color flow  Doppler.  Tricuspid Valve: The tricuspid valve is grossly normal. Tricuspid valve regurgitation is trivial by color flow Doppler.  Aortic Valve: The aortic valve is tricuspid Mild thickening of the aortic valve. Aortic valve regurgitation was not visualized by color flow Doppler. There is No stenosis of the aortic valve.  Pulmonic Valve: The pulmonic valve was grossly normal. Pulmonic valve regurgitation is not  visualized by color flow Doppler.  Aorta: The aorta is normal unless otherwise noted.  Venous: The inferior vena cava is normal in size with greater than 50% respiratory variability.  Additional Comments: Definity used; akinesis of the distal anteroseptal wall and apex; overall mild to moderate LV dysfunction; grade 1 diastolic dysfunction; mild LVH.   [place] doppler 04/27/19  Summary: Right: Findings consistent with chronic deep vein thrombosis involving the right peroneal veins. No cystic structure found in the popliteal fossa. Dilatation is noted of one of the paired peroneal veins, measuring .90 cm. All other veins visualized  appear fully compressible and demonstrate appropriate Doppler characteristics.  Left: No evidence of deep vein thrombosis in the lower extremity. No indirect evidence of obstruction proximal to the inguinal ligament. No cystic structure found in the popliteal fossa.   ASSESSMENT AND PLAN:  1.  Lower ext edema improved, if he does not take extra dose on wed edema increases. Continue lasix will check BMP   2.  CAD with prior PCI to LAD no angian      Current medicines are reviewed with the patient today.  The patient Has no concerns regarding medicines.  The following changes have been made:  See above Labs/ tests ordered today include:see above  Disposition:   FU:  see above  Signed, Cecilie Kicks, NP  08/25/2019 1:34 PM    [facility] [place] [place], [place] Phone: [phone]; Fax: [phone]

## 2019-08-26 LAB — BASIC METABOLIC PANEL WITH GFR
BUN/Creatinine Ratio: 13 (ref 10–24)
BUN: 14 mg/dL (ref 8–27)
CO2: 27 mmol/L (ref 20–29)
Calcium: 9.5 mg/dL (ref 8.6–10.2)
Chloride: 101 mmol/L (ref 96–106)
Creatinine, Ser: 1.07 mg/dL (ref 0.76–1.27)
GFR calc Af Amer: 74 mL/min/{1.73_m2}
GFR calc non Af Amer: 64 mL/min/{1.73_m2}
Glucose: 90 mg/dL (ref 65–99)
Potassium: 3.9 mmol/L (ref 3.5–5.2)
Sodium: 142 mmol/L (ref 134–144)

## 2019-09-01 DIAGNOSIS — Z79899 Other long term (current) drug therapy: Secondary | ICD-10-CM | POA: Diagnosis not present

## 2019-09-01 DIAGNOSIS — M0609 Rheumatoid arthritis without rheumatoid factor, multiple sites: Secondary | ICD-10-CM | POA: Diagnosis not present

## 2019-09-02 ENCOUNTER — Ambulatory Visit: Payer: PPO

## 2019-09-03 ENCOUNTER — Ambulatory Visit: Payer: PPO | Attending: Internal Medicine

## 2019-09-03 DIAGNOSIS — Z23 Encounter for immunization: Secondary | ICD-10-CM | POA: Insufficient documentation

## 2019-09-03 NOTE — Progress Notes (Signed)
   Covid-19 Vaccination Clinic  Name:  Connor Hansen    MRN: [id number] DOB: [birth year]  09/03/2019  Mr. Connor Hansen was observed post Covid-19 immunization for 15 minutes without incidence. He was provided with Vaccine Information Sheet and instruction to access the V-Safe system.   Mr. Connor Hansen was instructed to call 911 with any severe reactions post vaccine: Marland Kitchen Difficulty breathing  . Swelling of your face and throat  . A fast heartbeat  . A bad rash all over your body  . Dizziness and weakness    Immunizations Administered    Name Date Dose VIS Date Route   Pfizer COVID-19 Vaccine 09/03/2019 12:07 PM 0.3 mL 07/23/2019 Intramuscular   Manufacturer: [place]   Lot: [id number]   [place]: [id number]

## 2019-09-22 ENCOUNTER — Ambulatory Visit: Payer: PPO | Attending: Internal Medicine

## 2019-09-22 DIAGNOSIS — Z23 Encounter for immunization: Secondary | ICD-10-CM | POA: Insufficient documentation

## 2019-09-22 NOTE — Progress Notes (Signed)
   Covid-19 Vaccination Clinic  Name:  Connor Hansen    MRN: [id number] DOB: 06/18/1934  09/22/2019  Mr. [place] was observed post Covid-19 immunization for 15 minutes without incidence. He was provided with Vaccine Information Sheet and instruction to access the V-Safe system.   Mr. Mossey was instructed to call 911 with any severe reactions post vaccine: Marland Kitchen Difficulty breathing  . Swelling of your face and throat  . A fast heartbeat  . A bad rash all over your body  . Dizziness and weakness    Immunizations Administered    Name Date Dose VIS Date Route   Pfizer COVID-19 Vaccine 09/22/2019 11:27 AM 0.3 mL 07/23/2019 Intramuscular   Manufacturer: [place]   Lot: [id number]   [place]: [id number]

## 2019-10-20 DIAGNOSIS — R972 Elevated prostate specific antigen [PSA]: Secondary | ICD-10-CM | POA: Diagnosis not present

## 2019-10-20 DIAGNOSIS — M109 Gout, unspecified: Secondary | ICD-10-CM | POA: Diagnosis not present

## 2019-10-20 DIAGNOSIS — R7309 Other abnormal glucose: Secondary | ICD-10-CM | POA: Diagnosis not present

## 2019-10-20 DIAGNOSIS — I2510 Atherosclerotic heart disease of native coronary artery without angina pectoris: Secondary | ICD-10-CM | POA: Diagnosis not present

## 2019-10-20 DIAGNOSIS — Z1389 Encounter for screening for other disorder: Secondary | ICD-10-CM | POA: Diagnosis not present

## 2019-10-20 DIAGNOSIS — H3530 Unspecified macular degeneration: Secondary | ICD-10-CM | POA: Diagnosis not present

## 2019-10-20 DIAGNOSIS — I252 Old myocardial infarction: Secondary | ICD-10-CM | POA: Diagnosis not present

## 2019-10-20 DIAGNOSIS — K219 Gastro-esophageal reflux disease without esophagitis: Secondary | ICD-10-CM | POA: Diagnosis not present

## 2019-10-20 DIAGNOSIS — M069 Rheumatoid arthritis, unspecified: Secondary | ICD-10-CM | POA: Diagnosis not present

## 2019-10-20 DIAGNOSIS — E782 Mixed hyperlipidemia: Secondary | ICD-10-CM | POA: Diagnosis not present

## 2019-10-20 DIAGNOSIS — E039 Hypothyroidism, unspecified: Secondary | ICD-10-CM | POA: Diagnosis not present

## 2019-10-20 DIAGNOSIS — Z0000 Encounter for general adult medical examination without abnormal findings: Secondary | ICD-10-CM | POA: Diagnosis not present

## 2019-10-20 DIAGNOSIS — I10 Essential (primary) hypertension: Secondary | ICD-10-CM | POA: Diagnosis not present

## 2019-11-24 ENCOUNTER — Ambulatory Visit: Payer: PPO | Admitting: Cardiology

## 2019-11-30 DIAGNOSIS — M159 Polyosteoarthritis, unspecified: Secondary | ICD-10-CM | POA: Diagnosis not present

## 2019-11-30 DIAGNOSIS — Z6826 Body mass index (BMI) 26.0-26.9, adult: Secondary | ICD-10-CM | POA: Diagnosis not present

## 2019-11-30 DIAGNOSIS — Z79899 Other long term (current) drug therapy: Secondary | ICD-10-CM | POA: Diagnosis not present

## 2019-11-30 DIAGNOSIS — M0609 Rheumatoid arthritis without rheumatoid factor, multiple sites: Secondary | ICD-10-CM | POA: Diagnosis not present

## 2019-11-30 DIAGNOSIS — E663 Overweight: Secondary | ICD-10-CM | POA: Diagnosis not present

## 2019-11-30 DIAGNOSIS — M1009 Idiopathic gout, multiple sites: Secondary | ICD-10-CM | POA: Diagnosis not present

## 2019-11-30 DIAGNOSIS — M2550 Pain in unspecified joint: Secondary | ICD-10-CM | POA: Diagnosis not present

## 2019-12-08 ENCOUNTER — Encounter: Payer: Self-pay | Admitting: Cardiology

## 2019-12-08 ENCOUNTER — Other Ambulatory Visit: Payer: Self-pay

## 2019-12-08 ENCOUNTER — Ambulatory Visit: Payer: PPO | Admitting: Cardiology

## 2019-12-08 VITALS — BP 122/60 | HR 61 | Ht 69.0 in | Wt 182.0 lb

## 2019-12-08 DIAGNOSIS — I2583 Coronary atherosclerosis due to lipid rich plaque: Secondary | ICD-10-CM | POA: Diagnosis not present

## 2019-12-08 DIAGNOSIS — I208 Other forms of angina pectoris: Secondary | ICD-10-CM

## 2019-12-08 DIAGNOSIS — E782 Mixed hyperlipidemia: Secondary | ICD-10-CM | POA: Diagnosis not present

## 2019-12-08 DIAGNOSIS — I2510 Atherosclerotic heart disease of native coronary artery without angina pectoris: Secondary | ICD-10-CM | POA: Diagnosis not present

## 2019-12-08 DIAGNOSIS — R600 Localized edema: Secondary | ICD-10-CM | POA: Diagnosis not present

## 2019-12-08 NOTE — Patient Instructions (Signed)
Medication Instructions:  The current medical regimen is effective;  continue present plan and medications.  *If you need a refill on your cardiac medications before your next appointment, please call your pharmacy*  Follow-Up: At [facility], you and your health needs are our priority.  As part of our continuing mission to provide you with exceptional heart care, we have created designated Provider Care Teams.  These Care Teams include your primary Cardiologist (physician) and Advanced Practice Providers (APPs -  Physician Assistants and Nurse Practitioners) who all work together to provide you with the care you need, when you need it.  We recommend signing up for the patient portal called "MyChart".  Sign up information is provided on this After Visit Summary.  MyChart is used to connect with patients for Virtual Visits (Telemedicine).  Patients are able to view lab/test results, encounter notes, upcoming appointments, etc.  Non-urgent messages can be sent to your provider as well.   To learn more about what you can do with MyChart, go to [email or web address].    Your next appointment:   12 month(s)  The format for your next appointment:   In Person  Provider:   Mark Skains, MD   Thank you for choosing [facility]!!      

## 2019-12-08 NOTE — Progress Notes (Signed)
Cardiology Office Note:    Date:  12/08/2019   ID:  Connor Hansen, DOB 12-21-1933, MRN [id number]  PCP:  Wenda Low, MD  Cardiologist:  Candee Furbish, MD  Electrophysiologist:  None   Referring MD: Wenda Low, MD     History of Present Illness:    Connor Hansen is a 86 y.o. male here for the follow-up of coronary artery disease with prior bare-metal stent to mid LAD and RCA and prior peroneal vein DVT, no anticoagulation necessary spoke vascular surgery here for follow-up 2010 was admitted to the hospital via emergency room with equivocal ST segment changes and chest discomfort. Cardiac catheterization in 2010 at that time demonstrated a high grade mid LAD lesion in addition to a 99% subtotal length the mid right coronary artery lesion. It was difficult to ascertain which one was the culprit and a ventriculogram was done prior to intervention that demonstrated a modest size apical hypokinesis. Accordingly a stent was directed to the mid LAD lesion, bare-metal stent. The right coronary artery was also stented with a bare-metal stent at that time because it was felt that this lesion may occlude in a short period of time. This was done in February of 2010 with procedure performed at [facility].  1992 MI in [place].  Echocardiogram done on Jan 03, 2009 shows mild LVH, normal EF, distal anteroseptal and apical akinesis consistent with myocardial infarction, mild aortic sclerosis. Trivial mitral regurgitation and tricuspid regurgitation.   Overall has been quite pleased with his progress.  His edema is much improved.  He still enjoying golf.  Some days he may skip his Lasix.  No chest pain no fevers chills nausea vomiting shortness of breath.  They both received Covid vaccine.  Past Medical History:  Diagnosis Date  . CAD (coronary artery disease)    2/10 - BMS to mid LAD, RCA. Anteroapical akinesis - normal EF. 2010  . CKD (chronic kidney disease)   . Coronary atherosclerosis of  native coronary artery   . GERD (gastroesophageal reflux disease)   . HTN (hypertension)   . Hyperlipidemia   . Hypothyroidism   . Old myocardial infarct   . RA (rheumatoid arthritis) ([place])     Past Surgical History:  Procedure Laterality Date  . CHOLECYSTECTOMY    . HEMILAMINOTOMY LUMBAR SPINE      Current Medications: Current Meds  Medication Sig  . allopurinol (ZYLOPRIM) 100 MG tablet Take 100 mg by mouth daily.  Marland Kitchen aspirin 81 MG tablet Take 81 mg by mouth daily.  Marland Kitchen atorvastatin (LIPITOR) 80 MG tablet Take 80 mg by mouth daily.  . calcium-vitamin D (CALCIUM 500+D HIGH POTENCY) 500-400 MG-UNIT tablet Take 1 tablet by mouth daily.  . carvedilol (COREG) 6.25 MG tablet Take 1 tablet (6.25 mg total) by mouth 2 (two) times daily with a meal.  . Cholecalciferol (VITAMIN D) 400 UNITS capsule Take 400 Units by mouth daily.  Marland Kitchen etanercept (ENBREL SURECLICK) 50 MG/ML injection Inject 50 mg into the skin once a week.  . famotidine (PEPCID) 20 MG tablet Take 20 mg by mouth 2 (two) times daily.  . folic acid (FOLVITE) 1 MG tablet Take 2 mg by mouth 2 (two) times daily.  . furosemide (LASIX) 20 MG tablet Take 1 tablet by mouth on Monday, Tuesday, Thursday, Friday, Saturday, and Sunday; take 2 tablets by mouth on Wednesday.  . leflunomide (ARAVA) 20 MG tablet Take 1 tablet by mouth at bedtime.  Marland Kitchen levothyroxine (SYNTHROID, LEVOTHROID) 75 MCG tablet Take  75 mcg by mouth daily before breakfast.  . losartan (COZAAR) 50 MG tablet Take 50 mg by mouth daily.  . methotrexate (RHEUMATREX) 2.5 MG tablet Take 20 mg by mouth once a week. Caution:Chemotherapy. Protect from light.  . Multiple Vitamin (MULTIVITAMIN) tablet Take 1 tablet by mouth daily.  . Multiple Vitamins-Minerals (PRESERVISION/LUTEIN PO) Take 500 mg by mouth daily.  . Omega-3 Fatty Acids (FISH OIL PO) Take 1,000 capsules by mouth daily.  Marland Kitchen omeprazole (PRILOSEC) 20 MG capsule Take 20 mg by mouth daily.  . prednisoLONE 5 MG TABS tablet Take 5  mg by mouth daily.  . traMADol (ULTRAM) 50 MG tablet Take 50 mg by mouth as needed.     Allergies:   Patient has no known allergies.   Social History   Socioeconomic History  . Marital status: Married    Spouse name: Not on file  . Number of children: Not on file  . Years of education: Not on file  . Highest education level: Not on file  Occupational History  . Not on file  Tobacco Use  . Smoking status: Former Smoker    Years: 25.00    Types: Cigarettes  . Smokeless tobacco: Never Used  Substance and Sexual Activity  . Alcohol use: Yes    Comment: 2 drinks a week  . Drug use: No  . Sexual activity: Not on file  Other Topics Concern  . Not on file  Social History Narrative  . Not on file   Social Determinants of Health   Financial Resource Strain:   . Difficulty of Paying Living Expenses:   Food Insecurity:   . Worried About Charity fundraiser in the Last Year:   . Arboriculturist in the Last Year:   Transportation Needs:   . Film/video editor (Medical):   Marland Kitchen Lack of Transportation (Non-Medical):   Physical Activity:   . Days of Exercise per Week:   . Minutes of Exercise per Session:   Stress:   . Feeling of Stress :   Social Connections:   . Frequency of Communication with Friends and Family:   . Frequency of Social Gatherings with Friends and Family:   . Attends Religious Services:   . Active Member of Clubs or Organizations:   . Attends Archivist Meetings:   Marland Kitchen Marital Status:      Family History: The patient's family history includes Cancer in his father.   EKGs/Labs/Other Studies Reviewed:    EKG:  EKG is  ordered today.  The ekg ordered today demonstrates sinus rhythm 61, poor R wave progression  Recent Labs: 08/25/2019: BUN 14; Creatinine, Ser 1.07; Potassium 3.9; Sodium 142  Recent Lipid Panel No results found for: CHOL, TRIG, HDL, CHOLHDL, VLDL, LDLCALC, LDLDIRECT  Physical Exam:    VS:  BP 122/60   Pulse 61   Ht 5\' 9"   (1.753 m)   Wt 182 lb (82.6 kg)   SpO2 94%   BMI 26.88 kg/m     Wt Readings from Last 3 Encounters:  12/08/19 182 lb (82.6 kg)  08/25/19 186 lb (84.4 kg)  06/03/19 189 lb 3.2 oz (85.8 kg)     GEN:  Well nourished, well developed in no acute distress HEENT: Normal NECK: No JVD; No carotid bruits LYMPHATICS: No lymphadenopathy CARDIAC: RRR, no murmurs, rubs, gallops RESPIRATORY:  Clear to auscultation without rales, wheezing or rhonchi  ABDOMEN: Soft, non-tender, non-distended MUSCULOSKELETAL:  No edema; No deformity, lower extremity much better.  SKIN: Warm and dry NEUROLOGIC:  Alert and oriented x 3 PSYCHIATRIC:  Normal affect   ASSESSMENT:    1. Coronary artery disease due to lipid rich plaque   2. Bilateral lower extremity edema   3. Angina decubitus ([place])   4. Mixed hyperlipidemia    PLAN:    In order of problems listed above:  Superficial vein DVT lower extremity -Discussed previously with vascular surgery.  Given the location, no anticoagulation is necessary.  Continuing with Lasix. Better. Some in ankles. Feet are edematous but better.  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  Continuing with aspirin.  Utilize TED hose compression stockings. -Okay to use Lasix as he is currently using.  Last creatinine around 1.  Coronary disease post PCI to the LAD RCA -Doing well no anginal symptoms.  Angina -Well-controlled with current medications.  Hyperlipidemia -LDL has been lower than 70.  At goal.  Continue with high intensity statin therapy.   Medication Adjustments/Labs and Tests Ordered: Current medicines are reviewed at length with the patient today.  Concerns regarding medicines are outlined above.  Orders Placed This Encounter  Procedures  . EKG 12-Lead   No orders of the defined types were placed in this encounter.   Patient Instructions  Medication Instructions:  The current medical regimen is effective;  continue present plan and  medications.  *If you need a refill on your cardiac medications before your next appointment, please call your pharmacy*  Follow-Up: At [facility], you and your health needs are our priority.  As part of our continuing mission to provide you with exceptional heart care, we have created designated Provider Care Teams.  These Care Teams include your primary Cardiologist (physician) and Advanced Practice Providers (APPs -  Physician Assistants and Nurse Practitioners) who all work together to provide you with the care you need, when you need it.  We recommend signing up for the patient portal called "MyChart".  Sign up information is provided on this After Visit Summary.  MyChart is used to connect with patients for Virtual Visits (Telemedicine).  Patients are able to view lab/test results, encounter notes, upcoming appointments, etc.  Non-urgent messages can be sent to your provider as well.   To learn more about what you can do with MyChart, go to [email or web address].    Your next appointment:   12 month(s)  The format for your next appointment:   In Person  Provider:   Candee Furbish, MD   Thank you for choosing [facility]!!        Signed, Candee Furbish, MD  12/08/2019 3:50 PM    [place]

## 2019-12-14 ENCOUNTER — Other Ambulatory Visit: Payer: Self-pay | Admitting: Cardiology

## 2019-12-14 DIAGNOSIS — E7800 Pure hypercholesterolemia, unspecified: Secondary | ICD-10-CM

## 2019-12-14 DIAGNOSIS — I2510 Atherosclerotic heart disease of native coronary artery without angina pectoris: Secondary | ICD-10-CM

## 2019-12-14 DIAGNOSIS — I2583 Coronary atherosclerosis due to lipid rich plaque: Secondary | ICD-10-CM

## 2019-12-14 DIAGNOSIS — I208 Other forms of angina pectoris: Secondary | ICD-10-CM

## 2020-04-06 DIAGNOSIS — F4320 Adjustment disorder, unspecified: Secondary | ICD-10-CM | POA: Diagnosis not present

## 2020-04-06 DIAGNOSIS — M0609 Rheumatoid arthritis without rheumatoid factor, multiple sites: Secondary | ICD-10-CM | POA: Diagnosis not present

## 2020-04-06 DIAGNOSIS — Z79899 Other long term (current) drug therapy: Secondary | ICD-10-CM | POA: Diagnosis not present

## 2020-04-06 DIAGNOSIS — F341 Dysthymic disorder: Secondary | ICD-10-CM | POA: Diagnosis not present

## 2020-04-11 DIAGNOSIS — H353122 Nonexudative age-related macular degeneration, left eye, intermediate dry stage: Secondary | ICD-10-CM | POA: Diagnosis not present

## 2020-04-19 DIAGNOSIS — D1801 Hemangioma of skin and subcutaneous tissue: Secondary | ICD-10-CM | POA: Diagnosis not present

## 2020-04-19 DIAGNOSIS — D485 Neoplasm of uncertain behavior of skin: Secondary | ICD-10-CM | POA: Diagnosis not present

## 2020-04-19 DIAGNOSIS — D0462 Carcinoma in situ of skin of left upper limb, including shoulder: Secondary | ICD-10-CM | POA: Diagnosis not present

## 2020-04-19 DIAGNOSIS — L570 Actinic keratosis: Secondary | ICD-10-CM | POA: Diagnosis not present

## 2020-04-19 DIAGNOSIS — L820 Inflamed seborrheic keratosis: Secondary | ICD-10-CM | POA: Diagnosis not present

## 2020-04-19 DIAGNOSIS — Z85828 Personal history of other malignant neoplasm of skin: Secondary | ICD-10-CM | POA: Diagnosis not present

## 2020-04-19 DIAGNOSIS — L821 Other seborrheic keratosis: Secondary | ICD-10-CM | POA: Diagnosis not present

## 2020-04-19 DIAGNOSIS — C44319 Basal cell carcinoma of skin of other parts of face: Secondary | ICD-10-CM | POA: Diagnosis not present

## 2020-04-25 ENCOUNTER — Other Ambulatory Visit: Payer: Self-pay | Admitting: Cardiology

## 2020-04-25 DIAGNOSIS — H43813 Vitreous degeneration, bilateral: Secondary | ICD-10-CM | POA: Diagnosis not present

## 2020-04-25 DIAGNOSIS — H33193 Other retinoschisis and retinal cysts, bilateral: Secondary | ICD-10-CM | POA: Diagnosis not present

## 2020-04-25 DIAGNOSIS — H353232 Exudative age-related macular degeneration, bilateral, with inactive choroidal neovascularization: Secondary | ICD-10-CM | POA: Diagnosis not present

## 2020-04-25 DIAGNOSIS — H35372 Puckering of macula, left eye: Secondary | ICD-10-CM | POA: Diagnosis not present

## 2020-04-26 DIAGNOSIS — I2510 Atherosclerotic heart disease of native coronary artery without angina pectoris: Secondary | ICD-10-CM | POA: Diagnosis not present

## 2020-04-26 DIAGNOSIS — F341 Dysthymic disorder: Secondary | ICD-10-CM | POA: Diagnosis not present

## 2020-04-26 DIAGNOSIS — I252 Old myocardial infarction: Secondary | ICD-10-CM | POA: Diagnosis not present

## 2020-04-26 DIAGNOSIS — E039 Hypothyroidism, unspecified: Secondary | ICD-10-CM | POA: Diagnosis not present

## 2020-04-26 DIAGNOSIS — Z23 Encounter for immunization: Secondary | ICD-10-CM | POA: Diagnosis not present

## 2020-04-26 DIAGNOSIS — I10 Essential (primary) hypertension: Secondary | ICD-10-CM | POA: Diagnosis not present

## 2020-04-26 DIAGNOSIS — R7309 Other abnormal glucose: Secondary | ICD-10-CM | POA: Diagnosis not present

## 2020-04-26 DIAGNOSIS — M109 Gout, unspecified: Secondary | ICD-10-CM | POA: Diagnosis not present

## 2020-04-26 DIAGNOSIS — M069 Rheumatoid arthritis, unspecified: Secondary | ICD-10-CM | POA: Diagnosis not present

## 2020-05-02 DIAGNOSIS — H353122 Nonexudative age-related macular degeneration, left eye, intermediate dry stage: Secondary | ICD-10-CM | POA: Diagnosis not present

## 2020-07-18 DIAGNOSIS — M0609 Rheumatoid arthritis without rheumatoid factor, multiple sites: Secondary | ICD-10-CM | POA: Diagnosis not present

## 2020-07-18 DIAGNOSIS — M159 Polyosteoarthritis, unspecified: Secondary | ICD-10-CM | POA: Diagnosis not present

## 2020-07-18 DIAGNOSIS — M1009 Idiopathic gout, multiple sites: Secondary | ICD-10-CM | POA: Diagnosis not present

## 2020-07-18 DIAGNOSIS — Z6824 Body mass index (BMI) 24.0-24.9, adult: Secondary | ICD-10-CM | POA: Diagnosis not present

## 2020-07-18 DIAGNOSIS — M25511 Pain in right shoulder: Secondary | ICD-10-CM | POA: Diagnosis not present

## 2020-07-18 DIAGNOSIS — M79642 Pain in left hand: Secondary | ICD-10-CM | POA: Diagnosis not present

## 2020-10-17 DIAGNOSIS — L308 Other specified dermatitis: Secondary | ICD-10-CM | POA: Diagnosis not present

## 2020-10-17 DIAGNOSIS — Z85828 Personal history of other malignant neoplasm of skin: Secondary | ICD-10-CM | POA: Diagnosis not present

## 2020-10-17 DIAGNOSIS — L570 Actinic keratosis: Secondary | ICD-10-CM | POA: Diagnosis not present

## 2020-10-17 DIAGNOSIS — L578 Other skin changes due to chronic exposure to nonionizing radiation: Secondary | ICD-10-CM | POA: Diagnosis not present

## 2020-10-19 DIAGNOSIS — E663 Overweight: Secondary | ICD-10-CM | POA: Diagnosis not present

## 2020-10-19 DIAGNOSIS — Z6825 Body mass index (BMI) 25.0-25.9, adult: Secondary | ICD-10-CM | POA: Diagnosis not present

## 2020-10-19 DIAGNOSIS — M2550 Pain in unspecified joint: Secondary | ICD-10-CM | POA: Diagnosis not present

## 2020-10-19 DIAGNOSIS — M25511 Pain in right shoulder: Secondary | ICD-10-CM | POA: Diagnosis not present

## 2020-10-19 DIAGNOSIS — M159 Polyosteoarthritis, unspecified: Secondary | ICD-10-CM | POA: Diagnosis not present

## 2020-10-19 DIAGNOSIS — Z79899 Other long term (current) drug therapy: Secondary | ICD-10-CM | POA: Diagnosis not present

## 2020-10-19 DIAGNOSIS — M0609 Rheumatoid arthritis without rheumatoid factor, multiple sites: Secondary | ICD-10-CM | POA: Diagnosis not present

## 2020-10-19 DIAGNOSIS — M1009 Idiopathic gout, multiple sites: Secondary | ICD-10-CM | POA: Diagnosis not present

## 2020-10-24 DIAGNOSIS — H35423 Microcystoid degeneration of retina, bilateral: Secondary | ICD-10-CM | POA: Diagnosis not present

## 2020-10-24 DIAGNOSIS — H35433 Paving stone degeneration of retina, bilateral: Secondary | ICD-10-CM | POA: Diagnosis not present

## 2020-10-24 DIAGNOSIS — H353232 Exudative age-related macular degeneration, bilateral, with inactive choroidal neovascularization: Secondary | ICD-10-CM | POA: Diagnosis not present

## 2020-10-24 DIAGNOSIS — H35372 Puckering of macula, left eye: Secondary | ICD-10-CM | POA: Diagnosis not present

## 2020-11-01 DIAGNOSIS — M109 Gout, unspecified: Secondary | ICD-10-CM | POA: Diagnosis not present

## 2020-11-01 DIAGNOSIS — K219 Gastro-esophageal reflux disease without esophagitis: Secondary | ICD-10-CM | POA: Diagnosis not present

## 2020-11-01 DIAGNOSIS — I252 Old myocardial infarction: Secondary | ICD-10-CM | POA: Diagnosis not present

## 2020-11-01 DIAGNOSIS — R972 Elevated prostate specific antigen [PSA]: Secondary | ICD-10-CM | POA: Diagnosis not present

## 2020-11-01 DIAGNOSIS — R7303 Prediabetes: Secondary | ICD-10-CM | POA: Diagnosis not present

## 2020-11-01 DIAGNOSIS — Z0000 Encounter for general adult medical examination without abnormal findings: Secondary | ICD-10-CM | POA: Diagnosis not present

## 2020-11-01 DIAGNOSIS — I10 Essential (primary) hypertension: Secondary | ICD-10-CM | POA: Diagnosis not present

## 2020-11-01 DIAGNOSIS — Z1389 Encounter for screening for other disorder: Secondary | ICD-10-CM | POA: Diagnosis not present

## 2020-11-01 DIAGNOSIS — I2510 Atherosclerotic heart disease of native coronary artery without angina pectoris: Secondary | ICD-10-CM | POA: Diagnosis not present

## 2020-11-01 DIAGNOSIS — E039 Hypothyroidism, unspecified: Secondary | ICD-10-CM | POA: Diagnosis not present

## 2020-11-01 DIAGNOSIS — F341 Dysthymic disorder: Secondary | ICD-10-CM | POA: Diagnosis not present

## 2020-11-01 DIAGNOSIS — E782 Mixed hyperlipidemia: Secondary | ICD-10-CM | POA: Diagnosis not present

## 2020-11-01 DIAGNOSIS — M069 Rheumatoid arthritis, unspecified: Secondary | ICD-10-CM | POA: Diagnosis not present

## 2020-11-01 DIAGNOSIS — H3530 Unspecified macular degeneration: Secondary | ICD-10-CM | POA: Diagnosis not present

## 2020-12-17 ENCOUNTER — Other Ambulatory Visit: Payer: Self-pay | Admitting: Cardiology

## 2020-12-17 DIAGNOSIS — I2583 Coronary atherosclerosis due to lipid rich plaque: Secondary | ICD-10-CM

## 2020-12-17 DIAGNOSIS — I208 Other forms of angina pectoris: Secondary | ICD-10-CM

## 2020-12-17 DIAGNOSIS — I2510 Atherosclerotic heart disease of native coronary artery without angina pectoris: Secondary | ICD-10-CM

## 2020-12-17 DIAGNOSIS — E7800 Pure hypercholesterolemia, unspecified: Secondary | ICD-10-CM

## 2021-01-04 DIAGNOSIS — N529 Male erectile dysfunction, unspecified: Secondary | ICD-10-CM | POA: Diagnosis not present

## 2021-01-09 ENCOUNTER — Encounter: Payer: Self-pay | Admitting: Cardiology

## 2021-01-09 ENCOUNTER — Ambulatory Visit: Payer: PPO | Admitting: Cardiology

## 2021-01-09 ENCOUNTER — Other Ambulatory Visit: Payer: Self-pay

## 2021-01-09 VITALS — BP 124/70 | HR 60 | Ht 69.0 in | Wt 162.0 lb

## 2021-01-09 DIAGNOSIS — E782 Mixed hyperlipidemia: Secondary | ICD-10-CM | POA: Diagnosis not present

## 2021-01-09 DIAGNOSIS — I208 Other forms of angina pectoris: Secondary | ICD-10-CM

## 2021-01-09 DIAGNOSIS — I2510 Atherosclerotic heart disease of native coronary artery without angina pectoris: Secondary | ICD-10-CM | POA: Diagnosis not present

## 2021-01-09 DIAGNOSIS — I2583 Coronary atherosclerosis due to lipid rich plaque: Secondary | ICD-10-CM

## 2021-01-09 NOTE — Progress Notes (Signed)
Cardiology Office Note:    Date:  01/09/2021   ID:  Connor Hansen, DOB [birth year], MRN [id number]  PCP:  Wenda Low, MD   [facility] Providers Cardiologist:  Candee Furbish, MD     Referring MD: Wenda Low, MD    History of Present Illness:    Connor Hansen is a 86 y.o. male here for follow of hypertension, coronary artery disease, and hyperlipidemia.   Several years ago he had a myocardial infarction treated with thrombolysis. Does not recall a heart catheterization at that time. More recently in 2010 was admitted to the hospital via emergency room with equivocal ST segment changes and chest discomfort. Cardiac catheterization in 2010 at that time demonstrated a high grade mid LAD lesion in addition to a 99% subtotal length the mid right coronary artery lesion. It was difficult to ascertain which one was the culprit and a ventriculogram was done prior to intervention that demonstrated a modest size apical hypokinesis. Accordingly a stent was directed to the mid LAD lesion, bare-metal stent. The right coronary artery was also stented with a bare-metal stent at that time because it was felt that this lesion may occlude in a short period of time. This was done in February of 2010 with procedure performed at [facility].  1992 MI in [place].  Has established care here. Echocardiogram done on Jan 03, 2009 shows mild LVH, normal EF, distal anteroseptal and apical akinesis consistent with myocardial infarction, mild aortic sclerosis. Trivial mitral regurgitation and tricuspid regurgitation.  Overall his LDL has been 78, well controlled on atorvastatin 80. We went over goals. His hypertension is also well controlled. He occasionally will feel palpitations but this is quite rare. He has been having some back pain, this limits his playing of golf.   09/27/15 - No chest pain, no anginal symptoms. Doing well. Macular degeneration. Prior cataract surgery. No symptoms from  bradycardia.  12/12/16-overall he is doing very well. Still playing golf. No active exertional symptoms. Sometimes he has mild shortness of breath with increased activity. Twice a year he can feel his heart race but this is very rare. No chest pain.  04/06/18 - BP has never been high but last few visits it has been high.  He has had some back issues recently, mild back pain.  He has been walking.  No chest pain fevers chills nausea vomiting shortness of breath.  Today, seems to be doing quite well.  No fever chills nausea vomiting CVA bleeding.  Tolerating his medications fine.  He denies any exertional chest pain, tightness, or pressure. He has no orthopnea, PND, LE edema, or lightheadedness   Unfortunately his wife died close to a year ago.  He was able to rekindle a relationship with a woman he dated in high school, he is now engaged  He has lost about 20 to 30 pounds.  Not eating as well.  Past Medical History:  Diagnosis Date  . CAD (coronary artery disease)    2/10 - BMS to mid LAD, RCA. Anteroapical akinesis - normal EF. 2010  . CKD (chronic kidney disease)   . Coronary atherosclerosis of native coronary artery   . GERD (gastroesophageal reflux disease)   . HTN (hypertension)   . Hyperlipidemia   . Hypothyroidism   . Old myocardial infarct   . RA (rheumatoid arthritis) ([place])     Past Surgical History:  Procedure Laterality Date  . CHOLECYSTECTOMY    . HEMILAMINOTOMY LUMBAR SPINE      Current  Medications: Current Meds  Medication Sig  . allopurinol (ZYLOPRIM) 100 MG tablet Take 100 mg by mouth daily.  Marland Kitchen aspirin 81 MG tablet Take 81 mg by mouth daily.  Marland Kitchen atorvastatin (LIPITOR) 80 MG tablet Take 80 mg by mouth daily.  . calcium-vitamin D (OSCAL-500) 500-400 MG-UNIT tablet Take 1 tablet by mouth daily.  . carvedilol (COREG) 6.25 MG tablet TAKE 1 TABLET BY MOUTH TWICE DAILY WITH A MEAL.  Marland Kitchen Cholecalciferol (VITAMIN D) 400 UNITS capsule Take 400 Units by mouth daily.  Marland Kitchen  etanercept (ENBREL SURECLICK) 50 MG/ML injection Inject 50 mg into the skin once a week.  . famotidine (PEPCID) 20 MG tablet Take 20 mg by mouth 2 (two) times daily.  . folic acid (FOLVITE) 1 MG tablet Take 2 mg by mouth 2 (two) times daily.  . furosemide (LASIX) 20 MG tablet TAKE 1 TAB ON MONDAY,TUESDAY,THURSDAY, FRIDAY,SAT AND SUN. TAKE 2 TABS ON WED.  Marland Kitchen leflunomide (ARAVA) 20 MG tablet Take 1 tablet by mouth at bedtime.  Marland Kitchen levothyroxine (SYNTHROID, LEVOTHROID) 75 MCG tablet Take 75 mcg by mouth daily before breakfast.  . losartan (COZAAR) 50 MG tablet Take 50 mg by mouth daily.  . methotrexate (RHEUMATREX) 2.5 MG tablet Take 20 mg by mouth once a week. Caution:Chemotherapy. Protect from light.  . Multiple Vitamin (MULTIVITAMIN) tablet Take 1 tablet by mouth daily.  . Multiple Vitamins-Minerals (PRESERVISION/LUTEIN PO) Take 500 mg by mouth daily.  . Omega-3 Fatty Acids (FISH OIL PO) Take 1,000 capsules by mouth daily.  Marland Kitchen omeprazole (PRILOSEC) 20 MG capsule Take 20 mg by mouth daily.  . prednisoLONE 5 MG TABS tablet Take 5 mg by mouth daily.  . traMADol (ULTRAM) 50 MG tablet Take 50 mg by mouth as needed.     Allergies:   Patient has no known allergies.   Social History   Socioeconomic History  . Marital status: Married    Spouse name: Not on file  . Number of children: Not on file  . Years of education: Not on file  . Highest education level: Not on file  Occupational History  . Not on file  Tobacco Use  . Smoking status: Former Smoker    Years: 25.00    Types: Cigarettes  . Smokeless tobacco: Never Used  Vaping Use  . Vaping Use: Never used  Substance and Sexual Activity  . Alcohol use: Yes    Comment: 2 drinks a week  . Drug use: No  . Sexual activity: Not on file  Other Topics Concern  . Not on file  Social History Narrative  . Not on file   Social Determinants of Health   Financial Resource Strain: Not on file  Food Insecurity: Not on file  Transportation Needs:  Not on file  Physical Activity: Not on file  Stress: Not on file  Social Connections: Not on file     Family History: The patient's family history includes Cancer in his father.  ROS:   Please see the history of present illness. All other systems reviewed and are negative.  EKGs/Labs/Other Studies Reviewed:    The following studies were reviewed today: Echo 04/27/19- IMPRESSIONS  1. The left ventricle has mild-moderately reduced systolic function, with  an ejection fraction of 40-45%. The cavity size was normal. There is  mildly increased left ventricular wall thickness. Left ventricular  diastolic Doppler parameters are consistent  with impaired relaxation.  2. The right ventricle has normal systolic function. The cavity was  normal.  3. The  tricuspid valve is grossly normal.  4. The aortic valve is tricuspid. Mild thickening of the aortic valve. No  stenosis of the aortic valve.  5. The aorta is normal unless otherwise noted.  6. Definity used; akinesis of the distal anteroseptal wall and apex;  overall mild to moderate LV dysfunction; grade 1 diastolic dysfunction;  mild LVH.   VAS [place] Lower Extremity Venous Bilateral 04/27/19: Summary:  Right: Findings consistent with chronic deep vein thrombosis involving the  right peroneal veins. No cystic structure found in the popliteal fossa.  Dilatation is noted of one of the paired peroneal veins, measuring .90 cm.  All other veins visualized  appear fully compressible and demonstrate appropriate Doppler  characteristics.    Left: No evidence of deep vein thrombosis in the lower extremity. No  indirect evidence of obstruction proximal to the inguinal ligament. No  cystic structure found in the popliteal fossa.   EKG:   01/09/21- EKG is bradycardia 55 with no other specific abnormalities.  Ordered today.    Recent Labs: No results found for requested labs within last 8760 hours.  Recent Lipid Panel No results found  for: CHOL, TRIG, HDL, CHOLHDL, VLDL, LDLCALC, LDLDIRECT   Risk Assessment/Calculations:      Physical Exam:    VS:  BP 124/70 (BP Location: Left Arm, Patient Position: Sitting, Cuff Size: Normal)   Pulse 60   Ht 5\' 9"  (1.753 m)   Wt 162 lb (73.5 kg)   SpO2 97%   BMI 23.92 kg/m     Wt Readings from Last 3 Encounters:  01/09/21 162 lb (73.5 kg)  12/08/19 182 lb (82.6 kg)  08/25/19 186 lb (84.4 kg)     GEN: Well nourished, well developed in no acute distress HEENT: Normal NECK: No JVD; No carotid bruits LYMPHATICS: No lymphadenopathy CARDIAC: RRR, no murmurs, rubs, gallops RESPIRATORY:  Clear to auscultation without rales, wheezing or rhonchi  ABDOMEN: Soft, non-tender, non-distended MUSCULOSKELETAL:  No edema; No deformity  SKIN: Warm and dry NEUROLOGIC:  Alert and oriented x 3 PSYCHIATRIC:  Normal affect   ASSESSMENT:    1. Coronary artery disease due to lipid rich plaque   2. Angina decubitus ([place])   3. Mixed hyperlipidemia    PLAN:    In order of problems listed above: Coronary artery disease-status post PCI Stable, continue with current medication management aspirin, atorvastatin 80 mg, carvedilol 6.2 mg twice a day, losartan 50 mg a day.  Refills as needed. -PCI to LAD RCA.  Angina Continues to be well controlled with current medications.  Hyperlipidemia LDL less than 70.  At goal.  Continue with high intensity statin therapy for medical management.  Sinus bradycardia Asymptomatic.  Prior superficial vein DVT lower extremity - Given the location no anticoagulation was necessary.  Continuing with Lasix.  Compression hose.       Medication Adjustments/Labs and Tests Ordered: Current medicines are reviewed at length with the patient today.  Concerns regarding medicines are outlined above.  Orders Placed This Encounter  Procedures  . EKG 12-Lead   No orders of the defined types were placed in this encounter.   Patient Instructions  Medication  Instructions:  The current medical regimen is effective;  continue present plan and medications.  *If you need a refill on your cardiac medications before your next appointment, please call your pharmacy*  Follow-Up: At [facility], you and your health needs are our priority.  As part of our continuing mission to provide you with exceptional heart  care, we have created designated Provider Care Teams.  These Care Teams include your primary Cardiologist (physician) and Advanced Practice Providers (APPs -  Physician Assistants and Nurse Practitioners) who all work together to provide you with the care you need, when you need it.  We recommend signing up for the patient portal called "MyChart".  Sign up information is provided on this After Visit Summary.  MyChart is used to connect with patients for Virtual Visits (Telemedicine).  Patients are able to view lab/test results, encounter notes, upcoming appointments, etc.  Non-urgent messages can be sent to your provider as well.   To learn more about what you can do with MyChart, go to [email or web address].    Your next appointment:   12 month(s)  The format for your next appointment:   In Person  Provider:   Candee Furbish, MD   Thank you for choosing [facility]!!        Signed, Candee Furbish, MD  01/09/2021 3:20 PM    [place]

## 2021-01-09 NOTE — Patient Instructions (Signed)
Medication Instructions:  The current medical regimen is effective;  continue present plan and medications.  *If you need a refill on your cardiac medications before your next appointment, please call your pharmacy*  Follow-Up: At [facility], you and your health needs are our priority.  As part of our continuing mission to provide you with exceptional heart care, we have created designated Provider Care Teams.  These Care Teams include your primary Cardiologist (physician) and Advanced Practice Providers (APPs -  Physician Assistants and Nurse Practitioners) who all work together to provide you with the care you need, when you need it.  We recommend signing up for the patient portal called "MyChart".  Sign up information is provided on this After Visit Summary.  MyChart is used to connect with patients for Virtual Visits (Telemedicine).  Patients are able to view lab/test results, encounter notes, upcoming appointments, etc.  Non-urgent messages can be sent to your provider as well.   To learn more about what you can do with MyChart, go to [email or web address].    Your next appointment:   12 month(s)  The format for your next appointment:   In Person  Provider:   Mark Skains, MD   Thank you for choosing [facility]!!      

## 2021-01-19 ENCOUNTER — Other Ambulatory Visit: Payer: Self-pay | Admitting: Cardiology

## 2021-01-23 DIAGNOSIS — M2550 Pain in unspecified joint: Secondary | ICD-10-CM | POA: Diagnosis not present

## 2021-01-23 DIAGNOSIS — Z6824 Body mass index (BMI) 24.0-24.9, adult: Secondary | ICD-10-CM | POA: Diagnosis not present

## 2021-01-23 DIAGNOSIS — M0609 Rheumatoid arthritis without rheumatoid factor, multiple sites: Secondary | ICD-10-CM | POA: Diagnosis not present

## 2021-01-23 DIAGNOSIS — M159 Polyosteoarthritis, unspecified: Secondary | ICD-10-CM | POA: Diagnosis not present

## 2021-01-23 DIAGNOSIS — M1009 Idiopathic gout, multiple sites: Secondary | ICD-10-CM | POA: Diagnosis not present

## 2021-01-23 DIAGNOSIS — Z79899 Other long term (current) drug therapy: Secondary | ICD-10-CM | POA: Diagnosis not present

## 2021-04-02 DIAGNOSIS — M0609 Rheumatoid arthritis without rheumatoid factor, multiple sites: Secondary | ICD-10-CM | POA: Diagnosis not present

## 2021-05-16 DIAGNOSIS — J028 Acute pharyngitis due to other specified organisms: Secondary | ICD-10-CM | POA: Diagnosis not present

## 2021-05-16 DIAGNOSIS — Z20822 Contact with and (suspected) exposure to covid-19: Secondary | ICD-10-CM | POA: Diagnosis not present

## 2021-05-16 DIAGNOSIS — H9201 Otalgia, right ear: Secondary | ICD-10-CM | POA: Diagnosis not present

## 2021-05-16 DIAGNOSIS — R070 Pain in throat: Secondary | ICD-10-CM | POA: Diagnosis not present

## 2021-05-16 DIAGNOSIS — Z6825 Body mass index (BMI) 25.0-25.9, adult: Secondary | ICD-10-CM | POA: Diagnosis not present

## 2021-05-16 DIAGNOSIS — H6501 Acute serous otitis media, right ear: Secondary | ICD-10-CM | POA: Diagnosis not present

## 2021-05-22 DIAGNOSIS — J029 Acute pharyngitis, unspecified: Secondary | ICD-10-CM | POA: Diagnosis not present

## 2021-05-22 DIAGNOSIS — H6691 Otitis media, unspecified, right ear: Secondary | ICD-10-CM | POA: Diagnosis not present

## 2021-05-28 DIAGNOSIS — H9201 Otalgia, right ear: Secondary | ICD-10-CM | POA: Diagnosis not present

## 2021-06-14 DIAGNOSIS — Z85828 Personal history of other malignant neoplasm of skin: Secondary | ICD-10-CM | POA: Diagnosis not present

## 2021-06-14 DIAGNOSIS — L738 Other specified follicular disorders: Secondary | ICD-10-CM | POA: Diagnosis not present

## 2021-06-22 DIAGNOSIS — H9201 Otalgia, right ear: Secondary | ICD-10-CM | POA: Diagnosis not present

## 2021-06-22 DIAGNOSIS — H353232 Exudative age-related macular degeneration, bilateral, with inactive choroidal neovascularization: Secondary | ICD-10-CM | POA: Diagnosis not present

## 2021-06-22 DIAGNOSIS — M26629 Arthralgia of temporomandibular joint, unspecified side: Secondary | ICD-10-CM | POA: Insufficient documentation

## 2021-06-22 DIAGNOSIS — H35423 Microcystoid degeneration of retina, bilateral: Secondary | ICD-10-CM | POA: Diagnosis not present

## 2021-06-22 DIAGNOSIS — H35433 Paving stone degeneration of retina, bilateral: Secondary | ICD-10-CM | POA: Diagnosis not present

## 2021-06-22 DIAGNOSIS — H35372 Puckering of macula, left eye: Secondary | ICD-10-CM | POA: Diagnosis not present

## 2021-06-22 DIAGNOSIS — H93291 Other abnormal auditory perceptions, right ear: Secondary | ICD-10-CM | POA: Diagnosis not present

## 2021-06-22 DIAGNOSIS — Z87891 Personal history of nicotine dependence: Secondary | ICD-10-CM | POA: Diagnosis not present

## 2021-06-22 DIAGNOSIS — R6884 Jaw pain: Secondary | ICD-10-CM | POA: Diagnosis not present

## 2021-06-22 DIAGNOSIS — R29898 Other symptoms and signs involving the musculoskeletal system: Secondary | ICD-10-CM | POA: Diagnosis not present

## 2021-07-25 DIAGNOSIS — R5383 Other fatigue: Secondary | ICD-10-CM | POA: Diagnosis not present

## 2021-07-25 DIAGNOSIS — E663 Overweight: Secondary | ICD-10-CM | POA: Diagnosis not present

## 2021-07-25 DIAGNOSIS — M26609 Unspecified temporomandibular joint disorder, unspecified side: Secondary | ICD-10-CM | POA: Diagnosis not present

## 2021-07-25 DIAGNOSIS — Z6825 Body mass index (BMI) 25.0-25.9, adult: Secondary | ICD-10-CM | POA: Diagnosis not present

## 2021-07-25 DIAGNOSIS — Z79899 Other long term (current) drug therapy: Secondary | ICD-10-CM | POA: Diagnosis not present

## 2021-07-25 DIAGNOSIS — M1009 Idiopathic gout, multiple sites: Secondary | ICD-10-CM | POA: Diagnosis not present

## 2021-07-25 DIAGNOSIS — M0609 Rheumatoid arthritis without rheumatoid factor, multiple sites: Secondary | ICD-10-CM | POA: Diagnosis not present

## 2021-07-25 DIAGNOSIS — M159 Polyosteoarthritis, unspecified: Secondary | ICD-10-CM | POA: Diagnosis not present

## 2021-07-25 DIAGNOSIS — M2550 Pain in unspecified joint: Secondary | ICD-10-CM | POA: Diagnosis not present

## 2021-09-12 DIAGNOSIS — L853 Xerosis cutis: Secondary | ICD-10-CM | POA: Diagnosis not present

## 2021-09-12 DIAGNOSIS — Z85828 Personal history of other malignant neoplasm of skin: Secondary | ICD-10-CM | POA: Diagnosis not present

## 2021-09-12 DIAGNOSIS — L820 Inflamed seborrheic keratosis: Secondary | ICD-10-CM | POA: Diagnosis not present

## 2021-09-12 DIAGNOSIS — L570 Actinic keratosis: Secondary | ICD-10-CM | POA: Diagnosis not present

## 2021-10-23 DIAGNOSIS — M0609 Rheumatoid arthritis without rheumatoid factor, multiple sites: Secondary | ICD-10-CM | POA: Diagnosis not present

## 2021-11-06 DIAGNOSIS — M069 Rheumatoid arthritis, unspecified: Secondary | ICD-10-CM | POA: Diagnosis not present

## 2021-11-06 DIAGNOSIS — I252 Old myocardial infarction: Secondary | ICD-10-CM | POA: Diagnosis not present

## 2021-11-06 DIAGNOSIS — Z1389 Encounter for screening for other disorder: Secondary | ICD-10-CM | POA: Diagnosis not present

## 2021-11-06 DIAGNOSIS — E039 Hypothyroidism, unspecified: Secondary | ICD-10-CM | POA: Diagnosis not present

## 2021-11-06 DIAGNOSIS — R972 Elevated prostate specific antigen [PSA]: Secondary | ICD-10-CM | POA: Diagnosis not present

## 2021-11-06 DIAGNOSIS — Z0000 Encounter for general adult medical examination without abnormal findings: Secondary | ICD-10-CM | POA: Diagnosis not present

## 2021-11-06 DIAGNOSIS — E782 Mixed hyperlipidemia: Secondary | ICD-10-CM | POA: Diagnosis not present

## 2021-11-06 DIAGNOSIS — K219 Gastro-esophageal reflux disease without esophagitis: Secondary | ICD-10-CM | POA: Diagnosis not present

## 2021-11-06 DIAGNOSIS — I10 Essential (primary) hypertension: Secondary | ICD-10-CM | POA: Diagnosis not present

## 2022-01-25 ENCOUNTER — Other Ambulatory Visit: Payer: Self-pay | Admitting: Cardiology

## 2022-02-06 DIAGNOSIS — M0609 Rheumatoid arthritis without rheumatoid factor, multiple sites: Secondary | ICD-10-CM | POA: Diagnosis not present

## 2022-02-06 DIAGNOSIS — Z79899 Other long term (current) drug therapy: Secondary | ICD-10-CM | POA: Diagnosis not present

## 2022-02-06 DIAGNOSIS — M1009 Idiopathic gout, multiple sites: Secondary | ICD-10-CM | POA: Diagnosis not present

## 2022-04-02 DIAGNOSIS — H43813 Vitreous degeneration, bilateral: Secondary | ICD-10-CM | POA: Diagnosis not present

## 2022-04-09 DIAGNOSIS — H16223 Keratoconjunctivitis sicca, not specified as Sjogren's, bilateral: Secondary | ICD-10-CM | POA: Diagnosis not present

## 2022-04-24 DIAGNOSIS — H60312 Diffuse otitis externa, left ear: Secondary | ICD-10-CM | POA: Diagnosis not present

## 2022-04-24 DIAGNOSIS — R590 Localized enlarged lymph nodes: Secondary | ICD-10-CM | POA: Diagnosis not present

## 2022-04-24 DIAGNOSIS — H6505 Acute serous otitis media, recurrent, left ear: Secondary | ICD-10-CM | POA: Diagnosis not present

## 2022-04-24 DIAGNOSIS — Z87891 Personal history of nicotine dependence: Secondary | ICD-10-CM | POA: Diagnosis not present

## 2022-05-08 DIAGNOSIS — M1009 Idiopathic gout, multiple sites: Secondary | ICD-10-CM | POA: Diagnosis not present

## 2022-05-08 DIAGNOSIS — M0609 Rheumatoid arthritis without rheumatoid factor, multiple sites: Secondary | ICD-10-CM | POA: Diagnosis not present

## 2022-05-08 DIAGNOSIS — M1991 Primary osteoarthritis, unspecified site: Secondary | ICD-10-CM | POA: Diagnosis not present

## 2022-05-08 DIAGNOSIS — Z79899 Other long term (current) drug therapy: Secondary | ICD-10-CM | POA: Diagnosis not present

## 2022-05-09 ENCOUNTER — Other Ambulatory Visit: Payer: Self-pay | Admitting: Cardiology

## 2022-05-13 NOTE — Progress Notes (Signed)
Office Visit    Patient Name: Connor Hansen Date of Encounter: 05/14/2022  PCP:  Wenda Low, MD   [place]  Cardiologist:  Candee Furbish, MD  Advanced Practice Provider:  No care team member to display Electrophysiologist:  None   HPI    Connor Hansen is a 88 y.o. male with past medical history significant for hypertension, CAD, and hyperlipidemia presents today for annual follow-up visit.  Several years ago he had a myocardial infarction treated with thrombolysis.  Did not recall having a cardiac catheterization at that time.  More recently 2010 was admitted to the hospital via emergency room with equivocal ST segment changes and chest discomfort.  Cardiac catheterization 2010 at that time demonstrated high-grade mid LAD lesion in addition to 99% subtotal length of the mid right coronary artery lesion.  It is difficult to ascertain which 1 was the culprit and a ventriculogram was done prior to the intervention that demonstrated a modest sized apical hypokinesis.  Accordingly, stent was directed to the mid LAD lesion, bare-metal stent.  The right coronary artery was also stented with bare-metal stent at that time because it was felt that the lesion may occlude in a short period of time.  This was done February 2010 had [place].  Patient with history of MI in 83 in [place].  He has established care at [facility].  Echocardiogram done on Jan 03, 2009 showing mild LVH, normal EF, distal anteroseptal and apical akinesis consistent with myocardial infarction, mild aortic stenosis.  Trivial mitral regurgitation and tricuspid regurgitation.  LDL has been 78 and well-controlled on atorvastatin 80 mg.  Hypertension well controlled.  Occasionally having palpitations but quite rare.  He was last seen 01/09/2021 by Dr. Marlou Porch.  At that time he was doing quite well.  Tolerating medications okay.  Unfortunately, his wife died close to a year prior.  He was able to rekindle the  relationship with a woman he dated in highschool, and got engaged.  Today, he tells me that he was married for 52 years and after his wife passed away he was able to remarry a woman that he had dated in high school.  Doing well from cardiovascular standpoint.  He denies chest pain or shortness of breath.  He does have some mild bradycardia but is asymptomatic at this time.  Reports no shortness of breath nor dyspnea on exertion. Reports no chest pain, pressure, or tightness. No edema, orthopnea, PND. Reports no palpitations.    Past Medical History    Past Medical History:  Diagnosis Date   CAD (coronary artery disease)    2/10 - BMS to mid LAD, RCA. Anteroapical akinesis - normal EF. 2010   CKD (chronic kidney disease)    Coronary atherosclerosis of native coronary artery    GERD (gastroesophageal reflux disease)    HTN (hypertension)    Hyperlipidemia    Hypothyroidism    Old myocardial infarct    RA (rheumatoid arthritis) ([place])    Past Surgical History:  Procedure Laterality Date   CHOLECYSTECTOMY     HEMILAMINOTOMY LUMBAR SPINE      Allergies  No Known Allergies   EKGs/Labs/Other Studies Reviewed:   The following studies were reviewed today:  Echo 04/27/19- IMPRESSIONS   1. The left ventricle has mild-moderately reduced systolic function, with  an ejection fraction of 40-45%. The cavity size was normal. There is  mildly increased left ventricular wall thickness. Left ventricular  diastolic Doppler parameters are consistent  with impaired relaxation.   2. The right ventricle has normal systolic function. The cavity was  normal.   3. The tricuspid valve is grossly normal.   4. The aortic valve is tricuspid. Mild thickening of the aortic valve. No  stenosis of the aortic valve.   5. The aorta is normal unless otherwise noted.   6. Definity used; akinesis of the distal anteroseptal wall and apex;  overall mild to moderate LV dysfunction; grade 1 diastolic  dysfunction;  mild LVH.    VAS [place] Lower Extremity Venous Bilateral 04/27/19: Summary:  Right: Findings consistent with chronic deep vein thrombosis involving the  right peroneal veins. No cystic structure found in the popliteal fossa.  Dilatation is noted of one of the paired peroneal veins, measuring .90 cm.  All other veins visualized  appear fully compressible and demonstrate appropriate Doppler  characteristics.     Left: No evidence of deep vein thrombosis in the lower extremity. No  indirect evidence of obstruction proximal to the inguinal ligament. No  cystic structure found in the popliteal fossa.   EKG:  EKG is  ordered today.  The ekg ordered today demonstrates sinus bradycardia, rate 53 bpm.  Recent Labs: No results found for requested labs within last 365 days.  Recent Lipid Panel No results found for: "CHOL", "TRIG", "HDL", "CHOLHDL", "VLDL", "LDLCALC", "LDLDIRECT"   Home Medications   Current Meds  Medication Sig   allopurinol (ZYLOPRIM) 100 MG tablet Take 100 mg by mouth daily.   aspirin 81 MG tablet Take 81 mg by mouth daily.   calcium-vitamin D (OSCAL-500) 500-400 MG-UNIT tablet Take 1 tablet by mouth daily.   Cholecalciferol (VITAMIN D) 400 UNITS capsule Take 400 Units by mouth daily.   etanercept (ENBREL SURECLICK) 50 MG/ML injection Inject 50 mg into the skin once a week.   famotidine (PEPCID) 20 MG tablet Take 20 mg by mouth 2 (two) times daily.   folic acid (FOLVITE) 1 MG tablet Take 2 mg by mouth 2 (two) times daily.   leflunomide (ARAVA) 20 MG tablet Take 1 tablet by mouth at bedtime.   levothyroxine (SYNTHROID, LEVOTHROID) 75 MCG tablet Take 75 mcg by mouth daily before breakfast.   methotrexate (RHEUMATREX) 2.5 MG tablet Take 20 mg by mouth once a week. Caution:Chemotherapy. Protect from light.   Multiple Vitamin (MULTIVITAMIN) tablet Take 1 tablet by mouth daily.   Multiple Vitamins-Minerals (PRESERVISION/LUTEIN PO) Take 500 mg by mouth daily.    Omega-3 Fatty Acids (FISH OIL PO) Take 1,000 capsules by mouth daily.   omeprazole (PRILOSEC) 20 MG capsule Take 20 mg by mouth daily.   traMADol (ULTRAM) 50 MG tablet Take 50 mg by mouth as needed.   [DISCONTINUED] atorvastatin (LIPITOR) 80 MG tablet Take 80 mg by mouth daily.   [DISCONTINUED] carvedilol (COREG) 6.25 MG tablet Take 1 tablet (6.25 mg total) by mouth 2 (two) times daily with a meal.   [DISCONTINUED] losartan (COZAAR) 50 MG tablet Take 50 mg by mouth daily.     Review of Systems      All other systems reviewed and are otherwise negative except as noted above.  Physical Exam    VS:  BP 110/64   Pulse (!) 53   Ht '5\' 9"'$  (1.753 m)   Wt 175 lb 3.2 oz (79.5 kg)   SpO2 96%   BMI 25.87 kg/m  , BMI Body mass index is 25.87 kg/m.  Wt Readings from Last 3 Encounters:  05/14/22 175 lb 3.2 oz (79.5 kg)  01/09/21 162 lb (73.5 kg)  12/08/19 182 lb (82.6 kg)     GEN: Well nourished, well developed, in no acute distress. HEENT: normal. Neck: Supple, no JVD, carotid bruits, or masses. Cardiac: RRR, no murmurs, rubs, or gallops. No clubbing, cyanosis, edema.  Radials/PT 2+ and equal bilaterally.  Respiratory:  Respirations regular and unlabored, clear to auscultation bilaterally. GI: Soft, nontender, nondistended. MS: No deformity or atrophy. Skin: Warm and dry, no rash. Neuro:  Strength and sensation are intact. Psych: Normal affect.  Assessment & Plan    CAD no angina  -no chest  pain -asa '81mg'$ , Lipitor '80mg'$  daily, Coreg 6.'25mg'$  BID, and Cozaar '50mg'$  daily -continue current medication regimen  Mixed hyperlipidemia -triglycerides are elevated at 200 -eats a lot less sugar  Sinus bradycardia -no symptoms -no dizziness or lightheadedness  Prior superficial DVT of lower ext -no issues since, no swelling today      Disposition: Follow up 1 year with Candee Furbish, MD or APP.  Signed, Elgie Collard, PA-C 05/14/2022, 11:06 AM [place]

## 2022-05-14 ENCOUNTER — Encounter: Payer: Self-pay | Admitting: Physician Assistant

## 2022-05-14 ENCOUNTER — Ambulatory Visit: Payer: Medicare Other | Attending: Physician Assistant | Admitting: Physician Assistant

## 2022-05-14 VITALS — BP 110/64 | HR 53 | Ht 69.0 in | Wt 175.2 lb

## 2022-05-14 DIAGNOSIS — I209 Angina pectoris, unspecified: Secondary | ICD-10-CM

## 2022-05-14 DIAGNOSIS — E782 Mixed hyperlipidemia: Secondary | ICD-10-CM | POA: Diagnosis not present

## 2022-05-14 DIAGNOSIS — R001 Bradycardia, unspecified: Secondary | ICD-10-CM

## 2022-05-14 DIAGNOSIS — I2510 Atherosclerotic heart disease of native coronary artery without angina pectoris: Secondary | ICD-10-CM | POA: Diagnosis not present

## 2022-05-14 MED ORDER — LOSARTAN POTASSIUM 50 MG PO TABS: 50.0000 mg | ORAL_TABLET | Freq: Every day | ORAL | 3 refills | Status: DC

## 2022-05-14 MED ORDER — ATORVASTATIN CALCIUM 80 MG PO TABS: 80.0000 mg | ORAL_TABLET | Freq: Every day | ORAL | 3 refills | Status: DC

## 2022-05-14 MED ORDER — CARVEDILOL 6.25 MG PO TABS: 6.2500 mg | ORAL_TABLET | Freq: Two times a day (BID) | ORAL | 3 refills | Status: DC

## 2022-05-14 NOTE — Patient Instructions (Signed)
Medication Instructions:  Your physician recommends that you continue on your current medications as directed. Please refer to the Current Medication list given to you today.  *If you need a refill on your cardiac medications before your next appointment, please call your pharmacy*   Lab Work: None If you have labs (blood work) drawn today and your tests are completely normal, you will receive your results only by: [place] (if you have MyChart) OR A paper copy in the mail If you have any lab test that is abnormal or we need to change your treatment, we will call you to review the results.   Follow-Up: At [facility], you and your health needs are our priority.  As part of our continuing mission to provide you with exceptional heart care, we have created designated Provider Care Teams.  These Care Teams include your primary Cardiologist (physician) and Advanced Practice Providers (APPs -  Physician Assistants and Nurse Practitioners) who all work together to provide you with the care you need, when you need it.  Your next appointment:   1 year(s)  The format for your next appointment:   In Person  Provider:   Candee Furbish, MD  or Nicholes Rough, PA-C       Important Information About Sugar

## 2022-05-22 DIAGNOSIS — L820 Inflamed seborrheic keratosis: Secondary | ICD-10-CM | POA: Diagnosis not present

## 2022-05-22 DIAGNOSIS — Z85828 Personal history of other malignant neoplasm of skin: Secondary | ICD-10-CM | POA: Diagnosis not present

## 2022-05-22 DIAGNOSIS — L570 Actinic keratosis: Secondary | ICD-10-CM | POA: Diagnosis not present

## 2022-05-22 DIAGNOSIS — L821 Other seborrheic keratosis: Secondary | ICD-10-CM | POA: Diagnosis not present

## 2022-05-28 DIAGNOSIS — H35433 Paving stone degeneration of retina, bilateral: Secondary | ICD-10-CM | POA: Diagnosis not present

## 2022-05-28 DIAGNOSIS — H353231 Exudative age-related macular degeneration, bilateral, with active choroidal neovascularization: Secondary | ICD-10-CM | POA: Diagnosis not present

## 2022-05-28 DIAGNOSIS — H35423 Microcystoid degeneration of retina, bilateral: Secondary | ICD-10-CM | POA: Diagnosis not present

## 2022-05-28 DIAGNOSIS — H35372 Puckering of macula, left eye: Secondary | ICD-10-CM | POA: Diagnosis not present

## 2022-05-29 DIAGNOSIS — I2510 Atherosclerotic heart disease of native coronary artery without angina pectoris: Secondary | ICD-10-CM | POA: Diagnosis not present

## 2022-05-29 DIAGNOSIS — M069 Rheumatoid arthritis, unspecified: Secondary | ICD-10-CM | POA: Diagnosis not present

## 2022-05-29 DIAGNOSIS — N1831 Chronic kidney disease, stage 3a: Secondary | ICD-10-CM | POA: Diagnosis not present

## 2022-05-29 DIAGNOSIS — I10 Essential (primary) hypertension: Secondary | ICD-10-CM | POA: Diagnosis not present

## 2022-06-13 DIAGNOSIS — H353122 Nonexudative age-related macular degeneration, left eye, intermediate dry stage: Secondary | ICD-10-CM | POA: Diagnosis not present

## 2022-09-16 DIAGNOSIS — R5383 Other fatigue: Secondary | ICD-10-CM | POA: Diagnosis not present

## 2022-09-16 DIAGNOSIS — Z79899 Other long term (current) drug therapy: Secondary | ICD-10-CM | POA: Diagnosis not present

## 2022-09-16 DIAGNOSIS — M0609 Rheumatoid arthritis without rheumatoid factor, multiple sites: Secondary | ICD-10-CM | POA: Diagnosis not present

## 2022-10-17 DIAGNOSIS — M1009 Idiopathic gout, multiple sites: Secondary | ICD-10-CM | POA: Diagnosis not present

## 2022-10-17 DIAGNOSIS — M1991 Primary osteoarthritis, unspecified site: Secondary | ICD-10-CM | POA: Diagnosis not present

## 2022-10-17 DIAGNOSIS — Z79899 Other long term (current) drug therapy: Secondary | ICD-10-CM | POA: Diagnosis not present

## 2022-10-17 DIAGNOSIS — M0609 Rheumatoid arthritis without rheumatoid factor, multiple sites: Secondary | ICD-10-CM | POA: Diagnosis not present

## 2022-10-31 DIAGNOSIS — Z85828 Personal history of other malignant neoplasm of skin: Secondary | ICD-10-CM | POA: Diagnosis not present

## 2022-10-31 DIAGNOSIS — L853 Xerosis cutis: Secondary | ICD-10-CM | POA: Diagnosis not present

## 2022-10-31 DIAGNOSIS — L821 Other seborrheic keratosis: Secondary | ICD-10-CM | POA: Diagnosis not present

## 2022-10-31 DIAGNOSIS — L820 Inflamed seborrheic keratosis: Secondary | ICD-10-CM | POA: Diagnosis not present

## 2022-10-31 DIAGNOSIS — L570 Actinic keratosis: Secondary | ICD-10-CM | POA: Diagnosis not present

## 2022-10-31 DIAGNOSIS — L812 Freckles: Secondary | ICD-10-CM | POA: Diagnosis not present

## 2022-11-11 DIAGNOSIS — D84821 Immunodeficiency due to drugs: Secondary | ICD-10-CM | POA: Diagnosis not present

## 2022-11-11 DIAGNOSIS — R7303 Prediabetes: Secondary | ICD-10-CM | POA: Diagnosis not present

## 2022-11-11 DIAGNOSIS — Z0000 Encounter for general adult medical examination without abnormal findings: Secondary | ICD-10-CM | POA: Diagnosis not present

## 2022-11-11 DIAGNOSIS — E039 Hypothyroidism, unspecified: Secondary | ICD-10-CM | POA: Diagnosis not present

## 2022-11-11 DIAGNOSIS — M069 Rheumatoid arthritis, unspecified: Secondary | ICD-10-CM | POA: Diagnosis not present

## 2022-11-11 DIAGNOSIS — M109 Gout, unspecified: Secondary | ICD-10-CM | POA: Diagnosis not present

## 2022-11-11 DIAGNOSIS — R972 Elevated prostate specific antigen [PSA]: Secondary | ICD-10-CM | POA: Diagnosis not present

## 2022-11-11 DIAGNOSIS — Z7961 Long term (current) use of immunomodulator: Secondary | ICD-10-CM | POA: Diagnosis not present

## 2022-11-11 DIAGNOSIS — I10 Essential (primary) hypertension: Secondary | ICD-10-CM | POA: Diagnosis not present

## 2022-11-11 DIAGNOSIS — E782 Mixed hyperlipidemia: Secondary | ICD-10-CM | POA: Diagnosis not present

## 2022-11-11 DIAGNOSIS — N1831 Chronic kidney disease, stage 3a: Secondary | ICD-10-CM | POA: Diagnosis not present

## 2022-11-29 DIAGNOSIS — H524 Presbyopia: Secondary | ICD-10-CM | POA: Diagnosis not present

## 2022-12-03 ENCOUNTER — Ambulatory Visit: Payer: Medicare Other | Attending: Physician Assistant | Admitting: Physician Assistant

## 2022-12-03 ENCOUNTER — Encounter: Payer: Self-pay | Admitting: Physician Assistant

## 2022-12-03 VITALS — BP 112/68 | HR 56 | Ht 69.0 in | Wt 177.6 lb

## 2022-12-03 DIAGNOSIS — I2510 Atherosclerotic heart disease of native coronary artery without angina pectoris: Secondary | ICD-10-CM | POA: Diagnosis not present

## 2022-12-03 DIAGNOSIS — E782 Mixed hyperlipidemia: Secondary | ICD-10-CM | POA: Diagnosis not present

## 2022-12-03 DIAGNOSIS — R001 Bradycardia, unspecified: Secondary | ICD-10-CM | POA: Diagnosis not present

## 2022-12-03 DIAGNOSIS — I824Y9 Acute embolism and thrombosis of unspecified deep veins of unspecified proximal lower extremity: Secondary | ICD-10-CM | POA: Diagnosis not present

## 2022-12-03 MED ORDER — CARVEDILOL 3.125 MG PO TABS: 3.1250 mg | ORAL_TABLET | Freq: Two times a day (BID) | ORAL | 3 refills | Status: DC

## 2022-12-03 NOTE — Patient Instructions (Signed)
Medication Instructions:  1.Decrease carvedilol (Coreg) to 3.125 mg twice daily *If you need a refill on your cardiac medications before your next appointment, please call your pharmacy*   Lab Work: None If you have labs (blood work) drawn today and your tests are completely normal, you will receive your results only by: MyChart Message (if you have MyChart) OR A paper copy in the mail If you have any lab test that is abnormal or we need to change your treatment, we will call you to review the results.  Follow-Up: At [facility], you and your health needs are our priority.  As part of our continuing mission to provide you with exceptional heart care, we have created designated Provider Care Teams.  These Care Teams include your primary Cardiologist (physician) and Advanced Practice Providers (APPs -  Physician Assistants and Nurse Practitioners) who all work together to provide you with the care you need, when you need it.  Your next appointment:   6 month(s)  Provider:   Donato Schultz, MD  or Jari Favre, PA-C        Other Instructions Check your blood pressure daily, 1-2 hrs after taking morning medications for 2 weeks, keep a log and send [place] the readings through mychart at the end of the 2 weeks  Low-Sodium Eating Plan Sodium, which is an element that makes up salt, helps you maintain a healthy balance of fluids in your body. Too much sodium can increase your blood pressure and cause fluid and waste to be held in your body. Your health care provider or dietitian may recommend following this plan if you have high blood pressure (hypertension), kidney disease, liver disease, or heart failure. Eating less sodium can help lower your blood pressure, reduce swelling, and protect your heart, liver, and kidneys. What are tips for following this plan? Reading food labels The Nutrition Facts label lists the amount of sodium in one serving of the food. If you eat more than one serving,  you must multiply the listed amount of sodium by the number of servings. Choose foods with less than 140 mg of sodium per serving. Avoid foods with 300 mg of sodium or more per serving. Shopping  Look for lower-sodium products, often labeled as "low-sodium" or "no salt added." Always check the sodium content, even if foods are labeled as "unsalted" or "no salt added." Buy fresh foods. Avoid canned foods and pre-made or frozen meals. Avoid canned, cured, or processed meats. Buy breads that have less than 80 mg of sodium per slice. Cooking  Eat more home-cooked food and less restaurant, buffet, and fast food. Avoid adding salt when cooking. Use salt-free seasonings or herbs instead of table salt or sea salt. Check with your health care provider or pharmacist before using salt substitutes. Cook with plant-based oils, such as canola, sunflower, or olive oil. Meal planning When eating at a restaurant, ask that your food be prepared with less salt or no salt, if possible. Avoid dishes labeled as brined, pickled, cured, smoked, or made with soy sauce, miso, or teriyaki sauce. Avoid foods that contain MSG (monosodium glutamate). MSG is sometimes added to [place] food, bouillon, and some canned foods. Make meals that can be grilled, baked, poached, roasted, or steamed. These are generally made with less sodium. General information Most people on this plan should limit their sodium intake to 1,500-2,000 mg (milligrams) of sodium each day. What foods should I eat? Fruits Fresh, frozen, or canned fruit. Fruit juice. Vegetables Fresh or  frozen vegetables. "No salt added" canned vegetables. "No salt added" tomato sauce and paste. Low-sodium or reduced-sodium tomato and vegetable juice. Grains Low-sodium cereals, including oats, puffed wheat and rice, and shredded wheat. Low-sodium crackers. Unsalted rice. Unsalted pasta. Low-sodium bread. Whole-grain breads and whole-grain pasta. Meats and other  proteins Fresh or frozen (no salt added) meat, poultry, seafood, and fish. Low-sodium canned tuna and salmon. Unsalted nuts. Dried peas, beans, and lentils without added salt. Unsalted canned beans. Eggs. Unsalted nut butters. Dairy Milk. Soy milk. Cheese that is naturally low in sodium, such as ricotta cheese, fresh mozzarella, or Swiss cheese. Low-sodium or reduced-sodium cheese. Cream cheese. Yogurt. Seasonings and condiments Fresh and dried herbs and spices. Salt-free seasonings. Low-sodium mustard and ketchup. Sodium-free salad dressing. Sodium-free light mayonnaise. Fresh or refrigerated horseradish. Lemon juice. Vinegar. Other foods Homemade, reduced-sodium, or low-sodium soups. Unsalted popcorn and pretzels. Low-salt or salt-free chips. The items listed above may not be a complete list of foods and beverages you can eat. Contact a dietitian for more information. What foods should I avoid? Vegetables Sauerkraut, pickled vegetables, and relishes. Olives. [place] fries. Onion rings. Regular canned vegetables (not low-sodium or reduced-sodium). Regular canned tomato sauce and paste (not low-sodium or reduced-sodium). Regular tomato and vegetable juice (not low-sodium or reduced-sodium). Frozen vegetables in sauces. Grains Instant hot cereals. Bread stuffing, pancake, and biscuit mixes. Croutons. Seasoned rice or pasta mixes. Noodle soup cups. Boxed or frozen macaroni and cheese. Regular salted crackers. Self-rising flour. Meats and other proteins Meat or fish that is salted, canned, smoked, spiced, or pickled. Precooked or cured meat, such as sausages or meat loaves. Tomasa Blase. Ham. Pepperoni. Hot dogs. Corned beef. Chipped beef. Salt pork. Jerky. Pickled herring. Anchovies and sardines. Regular canned tuna. Salted nuts. Dairy Processed cheese and cheese spreads. Hard cheeses. Cheese curds. Blue cheese. Feta cheese. String cheese. Regular cottage cheese. Buttermilk. Canned milk. Fats and  oils Salted butter. Regular margarine. Ghee. Bacon fat. Seasonings and condiments Onion salt, garlic salt, seasoned salt, table salt, and sea salt. Canned and packaged gravies. Worcestershire sauce. Tartar sauce. Barbecue sauce. Teriyaki sauce. Soy sauce, including reduced-sodium. Steak sauce. Fish sauce. Oyster sauce. Cocktail sauce. Horseradish that you find on the shelf. Regular ketchup and mustard. Meat flavorings and tenderizers. Bouillon cubes. Hot sauce. Pre-made or packaged marinades. Pre-made or packaged taco seasonings. Relishes. Regular salad dressings. Salsa. Other foods Salted popcorn and pretzels. Corn chips and puffs. Potato and tortilla chips. Canned or dried soups. Pizza. Frozen entrees and pot pies. The items listed above may not be a complete list of foods and beverages you should avoid. Contact a dietitian for more information. Summary Eating less sodium can help lower your blood pressure, reduce swelling, and protect your heart, liver, and kidneys. Most people on this plan should limit their sodium intake to 1,500-2,000 mg (milligrams) of sodium each day. Canned, boxed, and frozen foods are high in sodium. Restaurant foods, fast foods, and pizza are also very high in sodium. You also get sodium by adding salt to food. Try to cook at home, eat more fresh fruits and vegetables, and eat less fast food and canned, processed, or prepared foods. This information is not intended to replace advice given to you by your health care provider. Make sure you discuss any questions you have with your health care provider. Document Revised: 07/05/2019 Document Reviewed: 06/30/2019 Elsevier Patient Education  2023 Elsevier Inc.  Heart-Healthy Eating Plan Many factors influence your heart health, including eating and exercise habits. Heart health is  also called coronary health. Coronary risk increases with abnormal blood fat (lipid) levels. A heart-healthy eating plan includes limiting unhealthy  fats, increasing healthy fats, limiting salt (sodium) intake, and making other diet and lifestyle changes. What is my plan? Your health care provider may recommend that: You limit your fat intake to _________% or less of your total calories each day. You limit your saturated fat intake to _________% or less of your total calories each day. You limit the amount of cholesterol in your diet to less than _________ mg per day. You limit the amount of sodium in your diet to less than _________ mg per day. What are tips for following this plan? Cooking Cook foods using methods other than frying. Baking, boiling, grilling, and broiling are all good options. Other ways to reduce fat include: Removing the skin from poultry. Removing all visible fats from meats. Steaming vegetables in water or broth. Meal planning  At meals, imagine dividing your plate into fourths: Fill one-half of your plate with vegetables and green salads. Fill one-fourth of your plate with whole grains. Fill one-fourth of your plate with lean protein foods. Eat 2-4 cups of vegetables per day. One cup of vegetables equals 1 cup (91 g) broccoli or cauliflower florets, 2 medium carrots, 1 large bell pepper, 1 large sweet potato, 1 large tomato, 1 medium white potato, 2 cups (150 g) raw leafy greens. Eat 1-2 cups of fruit per day. One cup of fruit equals 1 small apple, 1 large banana, 1 cup (237 g) mixed fruit, 1 large orange,  cup (82 g) dried fruit, 1 cup (240 mL) 100% fruit juice. Eat more foods that contain soluble fiber. Examples include apples, broccoli, carrots, beans, peas, and barley. Aim to get 25-30 g of fiber per day. Increase your consumption of legumes, nuts, and seeds to 4-5 servings per week. One serving of dried beans or legumes equals  cup (90 g) cooked, 1 serving of nuts is  oz (12 almonds, 24 pistachios, or 7 walnut halves), and 1 serving of seeds equals  oz (8 g). Fats Choose healthy fats more often. Choose  monounsaturated and polyunsaturated fats, such as olive and canola oils, avocado oil, flaxseeds, walnuts, almonds, and seeds. Eat more omega-3 fats. Choose salmon, mackerel, sardines, tuna, flaxseed oil, and ground flaxseeds. Aim to eat fish at least 2 times each week. Check food labels carefully to identify foods with trans fats or high amounts of saturated fat. Limit saturated fats. These are found in animal products, such as meats, butter, and cream. Plant sources of saturated fats include palm oil, palm kernel oil, and coconut oil. Avoid foods with partially hydrogenated oils in them. These contain trans fats. Examples are stick margarine, some tub margarines, cookies, crackers, and other baked goods. Avoid fried foods. General information Eat more home-cooked food and less restaurant, buffet, and fast food. Limit or avoid alcohol. Limit foods that are high in added sugar and simple starches such as foods made using white refined flour (white breads, pastries, sweets). Lose weight if you are overweight. Losing just 5-10% of your body weight can help your overall health and prevent diseases such as diabetes and heart disease. Monitor your sodium intake, especially if you have high blood pressure. Talk with your health care provider about your sodium intake. Try to incorporate more vegetarian meals weekly. What foods should I eat? Fruits All fresh, canned (in natural juice), or frozen fruits. Vegetables Fresh or frozen vegetables (raw, steamed, roasted, or grilled). Green salads.  Grains Most grains. Choose whole wheat and whole grains most of the time. Rice and pasta, including brown rice and pastas made with whole wheat. Meats and other proteins Lean, well-trimmed beef, veal, pork, and lamb. Chicken and [place] without skin. All fish and shellfish. Wild duck, rabbit, pheasant, and venison. Egg whites or low-cholesterol egg substitutes. Dried beans, peas, lentils, and tofu. Seeds and most  nuts. Dairy Low-fat or nonfat cheeses, including ricotta and mozzarella. Skim or 1% milk (liquid, powdered, or evaporated). Buttermilk made with low-fat milk. Nonfat or low-fat yogurt. Fats and oils Non-hydrogenated (trans-free) margarines. Vegetable oils, including soybean, sesame, sunflower, olive, avocado, peanut, safflower, corn, canola, and cottonseed. Salad dressings or mayonnaise made with a vegetable oil. Beverages Water (mineral or sparkling). Coffee and tea. Unsweetened ice tea. Diet beverages. Sweets and desserts Sherbet, gelatin, and fruit ice. Small amounts of dark chocolate. Limit all sweets and desserts. Seasonings and condiments All seasonings and condiments. The items listed above may not be a complete list of foods and beverages you can eat. Contact a dietitian for more options. What foods should I avoid? Fruits Canned fruit in heavy syrup. Fruit in cream or butter sauce. Fried fruit. Limit coconut. Vegetables Vegetables cooked in cheese, cream, or butter sauce. Fried vegetables. Grains Breads made with saturated or trans fats, oils, or whole milk. Croissants. Sweet rolls. Donuts. High-fat crackers, such as cheese crackers and chips. Meats and other proteins Fatty meats, such as hot dogs, ribs, sausage, bacon, rib-eye roast or steak. High-fat deli meats, such as salami and bologna. Caviar. Domestic duck and goose. Organ meats, such as liver. Dairy Cream, sour cream, cream cheese, and creamed cottage cheese. Whole-milk cheeses. Whole or 2% milk (liquid, evaporated, or condensed). Whole buttermilk. Cream sauce or high-fat cheese sauce. Whole-milk yogurt. Fats and oils Meat fat, or shortening. Cocoa butter, hydrogenated oils, palm oil, coconut oil, palm kernel oil. Solid fats and shortenings, including bacon fat, salt pork, lard, and butter. Nondairy cream substitutes. Salad dressings with cheese or sour cream. Beverages Regular sodas and any drinks with added sugar. Sweets  and desserts Frosting. Pudding. Cookies. Cakes. Pies. Milk chocolate or white chocolate. Buttered syrups. Full-fat ice cream or ice cream drinks. The items listed above may not be a complete list of foods and beverages to avoid. Contact a dietitian for more information. Summary Heart-healthy meal planning includes limiting unhealthy fats, increasing healthy fats, limiting salt (sodium) intake and making other diet and lifestyle changes. Lose weight if you are overweight. Losing just 5-10% of your body weight can help your overall health and prevent diseases such as diabetes and heart disease. Focus on eating a balance of foods, including fruits and vegetables, low-fat or nonfat dairy, lean protein, nuts and legumes, whole grains, and heart-healthy oils and fats. This information is not intended to replace advice given to you by your health care provider. Make sure you discuss any questions you have with your health care provider. Document Revised: 09/03/2021 Document Reviewed: 09/03/2021 Elsevier Patient Education  2023 [employer].

## 2022-12-03 NOTE — Progress Notes (Signed)
Office Visit    Connor Hansen Date of Encounter: 12/03/2022  PCP:  Georgann Housekeeper, MD   [facility]  Cardiologist:  Donato Schultz, MD  Advanced Practice Provider:  No care team member to display Electrophysiologist:  None   HPI    Connor Hansen is a 89 y.o. male with past medical history significant for hypertension, CAD, and hyperlipidemia presents today for annual follow-up visit.  Several years ago Connor Hansen had a myocardial infarction treated with thrombolysis.  Did not recall having a cardiac catheterization at that time.  More recently 2010 was admitted to the hospital via emergency room with equivocal ST segment changes and chest discomfort.  Cardiac catheterization 2010 at that time demonstrated high-grade mid LAD lesion in addition to 99% subtotal length of the mid right coronary artery lesion.  It is difficult to ascertain which 1 was the culprit and a ventriculogram was done prior to the intervention that demonstrated a modest sized apical hypokinesis.  Accordingly, stent was directed to the mid LAD lesion, bare-metal stent.  The right coronary artery was also stented with bare-metal stent at that time because it was felt that the lesion may occlude in a short period of time.  This was done February 2010 had [place].  Connor with history of MI in 47 in [place].  Connor Hansen has established care at [facility].  Echocardiogram done on Jan 03, 2009 showing mild LVH, normal EF, distal anteroseptal and apical akinesis consistent with myocardial infarction, mild aortic stenosis.  Trivial mitral regurgitation and tricuspid regurgitation.  LDL has been 78 and well-controlled on atorvastatin 80 mg.  Hypertension well controlled.  Occasionally having palpitations but quite rare.  Connor Hansen was last seen 01/09/2021 by Dr. Anne Fu.  At that time Connor Hansen was doing quite well.  Tolerating medications okay.  Unfortunately, Connor Hansen wife died close to a year prior.  Connor Hansen was able to rekindle the  relationship with a woman Connor Hansen dated in highschool, and got engaged.  Connor Hansen was seen by me 05/14/22, Connor Hansen tells me that Connor Hansen was married for 67 years and after Connor Hansen wife passed away Connor Hansen was able to remarry a woman that Connor Hansen had dated in high school.  Doing well from cardiovascular standpoint.  Connor Hansen denies chest pain or shortness of breath.  Connor Hansen does have some mild bradycardia but is asymptomatic at this time.  Today, Connor Hansen tells me that Connor Hansen always has a slower heart rate and previously was asymptomatic however, recently has been getting dizzy upon standing.  Blood pressure is low normal today 112/68.  Connor Hansen has been compliant with Connor Hansen medications.  No new cardiac issues at this time.  Reports no shortness of breath nor dyspnea on exertion. Reports no chest pain, pressure, or tightness. No edema, orthopnea, PND. Reports no palpitations.   Past Medical History    Past Medical History:  Diagnosis Date   CAD (coronary artery disease)    2/10 - BMS to mid LAD, RCA. Anteroapical akinesis - normal EF. 2010   CKD (chronic kidney disease)    Coronary atherosclerosis of native coronary artery    GERD (gastroesophageal reflux disease)    HTN (hypertension)    Hyperlipidemia    Hypothyroidism    Old myocardial infarct    RA (rheumatoid arthritis)    Past Surgical History:  Procedure Laterality Date   CHOLECYSTECTOMY     HEMILAMINOTOMY LUMBAR SPINE      Allergies  No Known Allergies   EKGs/Labs/Other Studies Reviewed:  The following studies were reviewed today:  Echo 04/27/19- IMPRESSIONS   1. The left ventricle has mild-moderately reduced systolic function, with  an ejection fraction of 40-45%. The cavity size was normal. There is  mildly increased left ventricular wall thickness. Left ventricular  diastolic Doppler parameters are consistent   with impaired relaxation.   2. The right ventricle has normal systolic function. The cavity was  normal.   3. The tricuspid valve is grossly normal.   4. The  aortic valve is tricuspid. Mild thickening of the aortic valve. No  stenosis of the aortic valve.   5. The aorta is normal unless otherwise noted.   6. Definity used; akinesis of the distal anteroseptal wall and apex;  overall mild to moderate LV dysfunction; grade 1 diastolic dysfunction;  mild LVH.    VAS [place] Lower Extremity Venous Bilateral 04/27/19: Summary:  Right: Findings consistent with chronic deep vein thrombosis involving the  right peroneal veins. No cystic structure found in the popliteal fossa.  Dilatation is noted of one of the paired peroneal veins, measuring .90 cm.  All other veins visualized  appear fully compressible and demonstrate appropriate Doppler  characteristics.     Left: No evidence of deep vein thrombosis in the lower extremity. No  indirect evidence of obstruction proximal to the inguinal ligament. No  cystic structure found in the popliteal fossa.   EKG:  EKG is  ordered today.  The ekg ordered today demonstrates sinus bradycardia, rate 53 bpm.  Recent Labs: No results found for requested labs within last 365 days.  Recent Lipid Panel No results found for: "CHOL", "TRIG", "HDL", "CHOLHDL", "VLDL", "LDLCALC", "LDLDIRECT"   Home Medications   Current Meds  Medication Sig   allopurinol (ZYLOPRIM) 100 MG tablet Take 100 mg by mouth daily.   aspirin 81 MG tablet Take 81 mg by mouth daily.   atorvastatin (LIPITOR) 80 MG tablet Take 1 tablet (80 mg total) by mouth daily.   calcium-vitamin D (OSCAL-500) 500-400 MG-UNIT tablet Take 1 tablet by mouth daily.   carvedilol (COREG) 3.125 MG tablet Take 1 tablet (3.125 mg total) by mouth 2 (two) times daily.   Cholecalciferol (VITAMIN D) 400 UNITS capsule Take 400 Units by mouth daily.   etanercept (ENBREL SURECLICK) 50 MG/ML injection Inject 50 mg into the skin once a week.   famotidine (PEPCID) 20 MG tablet Take 20 mg by mouth 2 (two) times daily.   folic acid (FOLVITE) 1 MG tablet Take 2 mg by mouth 2 (two)  times daily.   leflunomide (ARAVA) 20 MG tablet Take 1 tablet by mouth at bedtime.   levothyroxine (SYNTHROID, LEVOTHROID) 75 MCG tablet Take 75 mcg by mouth daily before breakfast.   losartan (COZAAR) 50 MG tablet Take 1 tablet (50 mg total) by mouth daily.   methotrexate (RHEUMATREX) 2.5 MG tablet Take 20 mg by mouth once a week. Caution:Chemotherapy. Protect from light.   Multiple Vitamin (MULTIVITAMIN) tablet Take 1 tablet by mouth daily.   Multiple Vitamins-Minerals (PRESERVISION/LUTEIN PO) Take 500 mg by mouth daily.   Omega-3 Fatty Acids (FISH OIL PO) Take 1,000 capsules by mouth daily.   omeprazole (PRILOSEC) 20 MG capsule Take 20 mg by mouth daily.   traMADol (ULTRAM) 50 MG tablet Take 50 mg by mouth as needed.   [DISCONTINUED] carvedilol (COREG) 6.25 MG tablet Take 1 tablet (6.25 mg total) by mouth 2 (two) times daily with a meal.     Review of Systems      All  other systems reviewed and are otherwise negative except as noted above.  Physical Exam    VS:  BP 112/68   Pulse (!) 56   Ht  (1.753 m)   Wt 177 lb 9.6 oz (80.6 kg)   SpO2 96%   BMI 26.23 kg/m  , BMI Body mass index is 26.23 kg/m.  Wt Readings from Last 3 Encounters:  12/03/22 177 lb 9.6 oz (80.6 kg)  05/14/22 175 lb 3.2 oz (79.5 kg)  01/09/21 162 lb (73.5 kg)     GEN: Well nourished, well developed, in no acute distress. HEENT: normal. Neck: Supple, no JVD, carotid bruits, or masses. Cardiac: sinus bradycardia, no murmurs, rubs, or gallops. No clubbing, cyanosis, edema.  Radials/PT 2+ and equal bilaterally.  Respiratory:  Respirations regular and unlabored, clear to auscultation bilaterally. GI: Soft, nontender, nondistended. MS: No deformity or atrophy. Skin: Warm and dry, no rash. Neuro:  Strength and sensation are intact. Psych: Normal affect.  Assessment & Plan    CAD no angina  -no chest  pain or SOB  -asa , Lipitor  daily, Coreg 6.25mg  BID, and Cozaar  daily -continue current  medication regimen  Mixed hyperlipidemia -triglycerides are elevated at 165 -trying some dietary adjustments -triglyceride lowering diet discussed  Sinus bradycardia -Occasional dizziness with standing -Will plan to decrease carvedilol to 3.125 twice a day -Asked the Connor to please track Connor Hansen blood pressure at home  Prior superficial DVT of lower ext -no issues since, no swelling today -was on lasix but now does not need it      Disposition: Follow up 6 months with Donato Schultz, MD or APP.  Signed, Sharlene Dory, PA-C 12/03/2022, 3:21 PM [facility]

## 2022-12-05 DIAGNOSIS — M0609 Rheumatoid arthritis without rheumatoid factor, multiple sites: Secondary | ICD-10-CM | POA: Diagnosis not present

## 2022-12-05 DIAGNOSIS — Z79899 Other long term (current) drug therapy: Secondary | ICD-10-CM | POA: Diagnosis not present

## 2022-12-05 DIAGNOSIS — E039 Hypothyroidism, unspecified: Secondary | ICD-10-CM | POA: Diagnosis not present

## 2023-02-06 DIAGNOSIS — U071 COVID-19: Secondary | ICD-10-CM | POA: Diagnosis not present

## 2023-02-06 DIAGNOSIS — R509 Fever, unspecified: Secondary | ICD-10-CM | POA: Diagnosis not present

## 2023-02-06 DIAGNOSIS — R0602 Shortness of breath: Secondary | ICD-10-CM | POA: Diagnosis not present

## 2023-03-28 DIAGNOSIS — M0609 Rheumatoid arthritis without rheumatoid factor, multiple sites: Secondary | ICD-10-CM | POA: Diagnosis not present

## 2023-03-28 DIAGNOSIS — Z79899 Other long term (current) drug therapy: Secondary | ICD-10-CM | POA: Diagnosis not present

## 2023-05-06 DIAGNOSIS — H04123 Dry eye syndrome of bilateral lacrimal glands: Secondary | ICD-10-CM | POA: Diagnosis not present

## 2023-05-20 ENCOUNTER — Other Ambulatory Visit: Payer: Self-pay | Admitting: Physician Assistant

## 2023-05-20 ENCOUNTER — Ambulatory Visit: Payer: Medicare Other | Attending: Physician Assistant | Admitting: Emergency Medicine

## 2023-05-20 ENCOUNTER — Encounter: Payer: Self-pay | Admitting: Emergency Medicine

## 2023-05-20 VITALS — BP 122/82 | HR 56 | Ht 69.0 in | Wt 179.0 lb

## 2023-05-20 DIAGNOSIS — I2510 Atherosclerotic heart disease of native coronary artery without angina pectoris: Secondary | ICD-10-CM | POA: Diagnosis not present

## 2023-05-20 DIAGNOSIS — I824Y9 Acute embolism and thrombosis of unspecified deep veins of unspecified proximal lower extremity: Secondary | ICD-10-CM

## 2023-05-20 DIAGNOSIS — E782 Mixed hyperlipidemia: Secondary | ICD-10-CM

## 2023-05-20 DIAGNOSIS — R001 Bradycardia, unspecified: Secondary | ICD-10-CM

## 2023-05-20 DIAGNOSIS — I10 Essential (primary) hypertension: Secondary | ICD-10-CM

## 2023-05-20 MED ORDER — FUROSEMIDE 20 MG PO TABS: 20.0000 mg | ORAL_TABLET | Freq: Every day | ORAL | 1 refills | Status: DC | PRN

## 2023-05-20 NOTE — Progress Notes (Signed)
Cardiology Office Note:    Date:  05/20/2023  ID:  Connor Hansen, DOB Nov 06, 1933, MRN [id number] PCP: Connor Housekeeper, MD  [facility] Providers Cardiologist:  Connor Schultz, MD       Patient Profile:      Coronary Artery Disease LHC 2010: (PCI to LAD/RCA) mid LAD bare-metal stent and RCA bare-metal stent. 1992 MI while living in [place] Echocardiogram 01/03/2009: Mild LVH, normal EF, distal anteroseptal and apical akinesis consistent with MI.  Mild aortic stenosis, trivial mitral regurgitation and tricuspid regurgitation.  Echocardiogram 04/27/2019: EF 40 to 45%.  Mild to moderate LV dysfunction, grade 1 DD, mild LVH.  Akinesis of the distal anterior septal wall and apex. Hypertension Hyperlipidemia Sinus bradycardia Rheumatoid Arthritis Right chronic superficial DVT VAS [place] 04/27/2019: DVT noted proximal/mid peroneal veins. Dialtion noted measuring .90cm No anticoagulation per vascular surgery       History of Present Illness:  Discussed the use of AI scribe software for clinical note transcription with the patient, who gave verbal consent to proceed.  Connor Hansen is a 89 y.o. male who returns for CAD, HTN, HLD six month follow-up. He.  Establish care in 2010 Dr. Anne Hansen.  It was noted in 1992 he had an MI in [place] that was treated with thrombolysis in which patient did not recall a heart catheterization at that time.  In 2010 he had a heart catheterization that showed mid LAD lesion in addition to 99% right coronary artery lesion.  A ventriculogram was completed that showed apical hypokinesis.  A bare-metal stent was placed in the mid LAD and the right coronary artery.   He was last seen by Connor Hy, PA.  He was doing well at the time, however he had been experiencing occasional on dizziness upon standing.  He has noted to always have a slower heart rate with history of sinus bradycardia, blood pressure at that visit was 112/68.  He had denied shortness of breath and chest pain.   Plan was to decrease his carvedilol to 3.125 mg twice a day and have the patient tracked his blood pressures at home.   Today Connor Hansen is doing well overall.  He does note that he has gotten back home from [place] last week.  He notes that he lives in [place] 8 months out of the year in [place] on 4 months.  He states that since his carvedilol was decreased to 3.125 mg twice daily his dizziness upon standing has completely improved. He does note increased lower leg swelling that started approximately 2 to 3 months ago. The patient reports that the swelling improves with the use of diabetic socks and resolves overnight when he lies flat. He denies any associated shortness of breath, chest pain, or coughing.  He does not have any calf or leg tenderness or pain.  He notes that he has been on Lasix for leg swelling that started in 2020, however he is stopped taking Lasix at some point last year as the swelling had improved and he felt like he did not need it.  In addition to the swelling, the patient reports an increase in his blood pressure readings since recovering from COVID-19 four to five months ago. He notes that his blood pressure, which was previously well-controlled, now averages around 138 to 142 mmHg. He denies any associated symptoms such as chest pain or unusual shortness of breath.  His blood pressure today was 122/82.  He notes that he takes his blood pressure with his wife's  blood pressure machine around his wrist.  He was notified to use his home blood pressure machine that takes blood pressures on his bicep.  He denied any lightheadedness, palpitations, or dizziness.  He denied any unusual weight gain       Review of Systems  Constitutional: Negative for fever and weight gain.  Cardiovascular:  Positive for leg swelling. Negative for chest pain, claudication, dyspnea on exertion, irregular heartbeat, near-syncope, orthopnea, palpitations, paroxysmal nocturnal dyspnea and syncope.   Respiratory:  Negative for cough, hemoptysis and shortness of breath.   Gastrointestinal:  Negative for abdominal pain.     See HPI     Studies Reviewed:   EKG Interpretation Date/Time:  Tuesday May 20 2023 13:29:54 EDT Ventricular Rate:  56 PR Interval:  188 QRS Duration:  90 QT Interval:  424 QTC Calculation: 409 R Axis:   -33  Text Interpretation: Sinus bradycardia No significant change was found Confirmed by Rise Paganini [id number]) on 05/20/2023 2:47:46 PM Risk Assessment/Calculations:             Physical Exam:   VS:  BP 122/82 (BP Location: Right Arm, Patient Position: Sitting)   Pulse (!) 56   Ht 5\' 9"  (1.753 m)   Wt 179 lb (81.2 kg)   SpO2 96%   BMI 26.43 kg/m    Wt Readings from Last 3 Encounters:  05/20/23 179 lb (81.2 kg)  12/03/22 177 lb 9.6 oz (80.6 kg)  05/14/22 175 lb 3.2 oz (79.5 kg)    Constitutional:      General: Awake.     Appearance: Healthy appearance.  Cardiovascular:     PMI at left midclavicular line. Normal rate. Regular rhythm. Normal S1. Normal S2.      Murmurs: There is no murmur.     No gallop.  No click. No rub.  Pulses:    Intact distal pulses.  Edema:    Peripheral edema present.    Pretibial: bilateral 1+ edema of the pretibial area.    Ankle: bilateral 1+ edema of the ankle.    Feet: bilateral 1+ edema of the feet. Neurological:     Mental Status: Alert.  Psychiatric:        Behavior: Behavior is cooperative.        Assessment and Plan:      Coronary artery disease -No chest pain, DOE -Continue to ASA 81 mg, atorvastatin 80 mg, carvedilol 3.125 mg twice daily -No need for ischemic evaluation -Encouraged heart healthy diet, continue physical activity, low sodium <2g   Hypertension -BP today 122/82, Well controlled.  -Recent home readings averaging 138-142 on wrist cuff, but today's office reading was 122/82. -Continue losartan 50 mg and carvedilol 3.125 mg twice daily -Advise patient to monitor blood pressure  at home using arm cuff for accuracy.  Chronic superficial DVT right lower extremity -Recurrence of swelling in legs and feet over the past 2-3 months. No associated shortness of breath or cough.  He denies leg or calf pain -Chronic DVT diagnosed 2020 via vascular ultrasound, vascular's recommendations were to continue ASA 81 mg, TED hose/compression stockings, Lasix -He notes that he had stopped Lasix over a year ago due to no swelling in legs or feet -Plan to start Lasix 20mg  as needed for LE swelling. If taking more than three days in a row, call office to repeat a basic metabolic panel. Creatinine at baseline 1.1 on 03/28/2023.  -Advised patient to use compression stockings and elevate legs. -His echo 2020 showed an EF of  40 to 45%, he does deny cough and shortness of breath.  He denied repeat echocardiogram to assess EF at this time.  Hyperlipidemia Well controlled with LDL of 60 and total cholesterol of 124 on 11/11/2022. -Continue atorvastatin 80 mg -Courage high-fiber, low sugar diet  Sinus bradycardia -Heart rate 56 -He notes improved occasional dizziness with standing since decreased carvedilol to 3.125 mg twice a day -No lightheadedness   Follow-up Plan for next visit in approximately 1 year, depending on patient's travel schedule.            Dispo:  Return in about 1 year (around 05/19/2024) for Routine Folow Up.  Signed, Denyce Robert, AGNP-C

## 2023-05-20 NOTE — Patient Instructions (Signed)
Medication Instructions:  Your physician has recommended you make the following change in your medication:   START  Lasix 20 mg taking only as needed for edema. If you have to take it 2-3 days in a row, call the office.  *If you need a refill on your cardiac medications before your next appointment, please call your pharmacy*   Lab Work: None ordered  If you have labs (blood work) drawn today and your tests are completely normal, you will receive your results only by: MyChart Message (if you have MyChart) OR A paper copy in the mail If you have any lab test that is abnormal or we need to change your treatment, we will call you to review the results.   Testing/Procedures: None ordered   Follow-Up: At [facility], you and your health needs are our priority.  As part of our continuing mission to provide you with exceptional heart care, we have created designated Provider Care Teams.  These Care Teams include your primary Cardiologist (physician) and Advanced Practice Providers (APPs -  Physician Assistants and Nurse Practitioners) who all work together to provide you with the care you need, when you need it.  We recommend signing up for the patient portal called "MyChart".  Sign up information is provided on this After Visit Summary.  MyChart is used to connect with patients for Virtual Visits (Telemedicine).  Patients are able to view lab/test results, encounter notes, upcoming appointments, etc.  Non-urgent messages can be sent to your provider as well.   To learn more about what you can do with MyChart, go to [email or web address].    Your next appointment:   1 year(s)  Provider:   Donato Schultz, MD     Other Instructions

## 2023-05-21 DIAGNOSIS — H35372 Puckering of macula, left eye: Secondary | ICD-10-CM | POA: Diagnosis not present

## 2023-05-21 DIAGNOSIS — H353134 Nonexudative age-related macular degeneration, bilateral, advanced atrophic with subfoveal involvement: Secondary | ICD-10-CM | POA: Diagnosis not present

## 2023-05-21 DIAGNOSIS — H43813 Vitreous degeneration, bilateral: Secondary | ICD-10-CM | POA: Diagnosis not present

## 2023-05-21 DIAGNOSIS — H353232 Exudative age-related macular degeneration, bilateral, with inactive choroidal neovascularization: Secondary | ICD-10-CM | POA: Diagnosis not present

## 2023-05-22 DIAGNOSIS — H16223 Keratoconjunctivitis sicca, not specified as Sjogren's, bilateral: Secondary | ICD-10-CM | POA: Diagnosis not present

## 2023-05-22 DIAGNOSIS — Z961 Presence of intraocular lens: Secondary | ICD-10-CM | POA: Diagnosis not present

## 2023-05-28 DIAGNOSIS — L821 Other seborrheic keratosis: Secondary | ICD-10-CM | POA: Diagnosis not present

## 2023-05-28 DIAGNOSIS — Z85828 Personal history of other malignant neoplasm of skin: Secondary | ICD-10-CM | POA: Diagnosis not present

## 2023-05-28 DIAGNOSIS — L812 Freckles: Secondary | ICD-10-CM | POA: Diagnosis not present

## 2023-05-28 DIAGNOSIS — L820 Inflamed seborrheic keratosis: Secondary | ICD-10-CM | POA: Diagnosis not present

## 2023-05-28 DIAGNOSIS — D225 Melanocytic nevi of trunk: Secondary | ICD-10-CM | POA: Diagnosis not present

## 2023-05-30 DIAGNOSIS — I10 Essential (primary) hypertension: Secondary | ICD-10-CM | POA: Diagnosis not present

## 2023-05-30 DIAGNOSIS — R350 Frequency of micturition: Secondary | ICD-10-CM | POA: Diagnosis not present

## 2023-05-30 DIAGNOSIS — M069 Rheumatoid arthritis, unspecified: Secondary | ICD-10-CM | POA: Diagnosis not present

## 2023-05-30 DIAGNOSIS — N1831 Chronic kidney disease, stage 3a: Secondary | ICD-10-CM | POA: Diagnosis not present

## 2023-05-30 DIAGNOSIS — Z23 Encounter for immunization: Secondary | ICD-10-CM | POA: Diagnosis not present

## 2023-05-30 DIAGNOSIS — R7303 Prediabetes: Secondary | ICD-10-CM | POA: Diagnosis not present

## 2023-05-30 DIAGNOSIS — I2510 Atherosclerotic heart disease of native coronary artery without angina pectoris: Secondary | ICD-10-CM | POA: Diagnosis not present

## 2023-05-30 DIAGNOSIS — E039 Hypothyroidism, unspecified: Secondary | ICD-10-CM | POA: Diagnosis not present

## 2023-05-30 DIAGNOSIS — E782 Mixed hyperlipidemia: Secondary | ICD-10-CM | POA: Diagnosis not present

## 2023-06-10 DIAGNOSIS — Z79899 Other long term (current) drug therapy: Secondary | ICD-10-CM | POA: Diagnosis not present

## 2023-06-10 DIAGNOSIS — M1991 Primary osteoarthritis, unspecified site: Secondary | ICD-10-CM | POA: Diagnosis not present

## 2023-06-10 DIAGNOSIS — M1009 Idiopathic gout, multiple sites: Secondary | ICD-10-CM | POA: Diagnosis not present

## 2023-06-10 DIAGNOSIS — M0609 Rheumatoid arthritis without rheumatoid factor, multiple sites: Secondary | ICD-10-CM | POA: Diagnosis not present

## 2023-08-26 DIAGNOSIS — I10 Essential (primary) hypertension: Secondary | ICD-10-CM | POA: Diagnosis not present

## 2023-08-26 DIAGNOSIS — I160 Hypertensive urgency: Secondary | ICD-10-CM | POA: Diagnosis not present

## 2023-08-26 DIAGNOSIS — R5381 Other malaise: Secondary | ICD-10-CM | POA: Diagnosis not present

## 2023-08-26 DIAGNOSIS — Z79899 Other long term (current) drug therapy: Secondary | ICD-10-CM | POA: Diagnosis not present

## 2023-08-26 DIAGNOSIS — R519 Headache, unspecified: Secondary | ICD-10-CM | POA: Diagnosis not present

## 2023-08-26 DIAGNOSIS — G932 Benign intracranial hypertension: Secondary | ICD-10-CM | POA: Diagnosis not present

## 2023-08-27 ENCOUNTER — Telehealth: Payer: Self-pay | Admitting: Emergency Medicine

## 2023-08-27 NOTE — Telephone Encounter (Signed)
 Spoke to son, Connor Hansen. Discussed w/ Dr. Renna Cary. Advised to call PCP for further advisement, per MD. Advised if worsens to go back to ED, but if stable could give more time to allow pressure to improve, but still contact PCP for further advisement on this matter. Son verbalized understanding and agreeable to plan.

## 2023-08-27 NOTE — Telephone Encounter (Signed)
 New Message:    Patient's daughter is calling. Patient is in [place]  having problems with his blood pressure. He went to an ER there yesterday and they said he was having a Hypertensive Crisis. She said they doubled up on his blood pressure yesterday, today she says it is not much better. She wants to know what should be the next step. Should she try and go get him to bring him back here or does he need a referral from Dr Renna Cary to see a Cardiologist, close by, in that area?    Pt c/o BP issue: STAT if pt c/o blurred vision, one-sided weakness or slurred speech  1. What are your last 5 BP readings? Today it was 197/108 pulse 73 yesterday it 208/101  2. Are you having any other symptoms (ex. Dizziness, headache, blurred vision, passed out)? Headaches for weeks, vision was off, pain in his ear,and confused  3. What is your BP issue? High blood pressure

## 2023-09-02 DIAGNOSIS — M069 Rheumatoid arthritis, unspecified: Secondary | ICD-10-CM | POA: Diagnosis not present

## 2023-09-02 DIAGNOSIS — N1831 Chronic kidney disease, stage 3a: Secondary | ICD-10-CM | POA: Diagnosis not present

## 2023-09-02 DIAGNOSIS — I10 Essential (primary) hypertension: Secondary | ICD-10-CM | POA: Diagnosis not present

## 2023-09-16 DIAGNOSIS — E039 Hypothyroidism, unspecified: Secondary | ICD-10-CM | POA: Diagnosis not present

## 2023-09-16 DIAGNOSIS — I2510 Atherosclerotic heart disease of native coronary artery without angina pectoris: Secondary | ICD-10-CM | POA: Diagnosis not present

## 2023-09-16 DIAGNOSIS — I10 Essential (primary) hypertension: Secondary | ICD-10-CM | POA: Diagnosis not present

## 2023-09-16 DIAGNOSIS — M109 Gout, unspecified: Secondary | ICD-10-CM | POA: Diagnosis not present

## 2023-09-16 DIAGNOSIS — E782 Mixed hyperlipidemia: Secondary | ICD-10-CM | POA: Diagnosis not present

## 2023-09-16 DIAGNOSIS — R972 Elevated prostate specific antigen [PSA]: Secondary | ICD-10-CM | POA: Diagnosis not present

## 2023-09-16 DIAGNOSIS — Z0000 Encounter for general adult medical examination without abnormal findings: Secondary | ICD-10-CM | POA: Diagnosis not present

## 2023-09-17 DIAGNOSIS — M0609 Rheumatoid arthritis without rheumatoid factor, multiple sites: Secondary | ICD-10-CM | POA: Diagnosis not present

## 2023-09-17 DIAGNOSIS — R5383 Other fatigue: Secondary | ICD-10-CM | POA: Diagnosis not present

## 2023-11-26 DIAGNOSIS — M5090 Cervical disc disorder, unspecified, unspecified cervical region: Secondary | ICD-10-CM | POA: Diagnosis not present

## 2023-11-26 DIAGNOSIS — I10 Essential (primary) hypertension: Secondary | ICD-10-CM | POA: Diagnosis not present

## 2023-11-26 DIAGNOSIS — M069 Rheumatoid arthritis, unspecified: Secondary | ICD-10-CM | POA: Diagnosis not present

## 2023-12-28 ENCOUNTER — Other Ambulatory Visit: Payer: Self-pay | Admitting: Physician Assistant

## 2024-01-06 DIAGNOSIS — M0609 Rheumatoid arthritis without rheumatoid factor, multiple sites: Secondary | ICD-10-CM | POA: Diagnosis not present

## 2024-01-06 DIAGNOSIS — Z79899 Other long term (current) drug therapy: Secondary | ICD-10-CM | POA: Diagnosis not present

## 2024-02-10 DIAGNOSIS — M545 Low back pain, unspecified: Secondary | ICD-10-CM | POA: Diagnosis not present

## 2024-02-16 DIAGNOSIS — R5383 Other fatigue: Secondary | ICD-10-CM | POA: Diagnosis not present

## 2024-02-16 DIAGNOSIS — I2510 Atherosclerotic heart disease of native coronary artery without angina pectoris: Secondary | ICD-10-CM | POA: Diagnosis not present

## 2024-02-16 DIAGNOSIS — I10 Essential (primary) hypertension: Secondary | ICD-10-CM | POA: Diagnosis not present

## 2024-02-16 DIAGNOSIS — R0609 Other forms of dyspnea: Secondary | ICD-10-CM | POA: Diagnosis not present

## 2024-02-16 DIAGNOSIS — N1831 Chronic kidney disease, stage 3a: Secondary | ICD-10-CM | POA: Diagnosis not present

## 2024-02-16 NOTE — Progress Notes (Unsigned)
 Cardiology Office Note    Date:  02/18/2024  ID:  Connor Hansen, DOB 07-11-1934, MRN [id number] PCP:  Connor Other, MD  Cardiologist:  Connor Parchment, MD  Electrophysiologist:  None   Chief Complaint: Fatigue, hypertension   History of Present Illness: .    Connor Hansen is a 89 y.o. male with visit-pertinent history of CAD with PCI to LAD/RCA in 2010, history of remote MI in 1992, hypertension, hyperlipidemia, sinus bradycardia, RA, right chronic superficial DVT not on anticoagulation per vascular surgery.  Patient establish care with Dr. Parchment in 2010.  As noted in 1992 he had an MI in [place] that was treated with thrombolysis in which patient did not recall heart catheterization at that time.  In 2010 he had a heart catheterization that showed mid LAD lesion in addition to 99% right coronary artery lesion.  Patient had a bare-metal stent placed to the mid LAD and RCA, and direct ventriculogram showed apical hypokinesis.  Last echocardiogram in 04/2019 indicated LVEF of 40 to 45%, mildly increased left wall thickness, RV systolic function and size was normal, no evidence of aortic valve stenosis.  Patient was seen in 11/2022, he was doing well at time however had been experiencing occasional dizziness upon standing.  He was noted to always have a slower heart rate with history of sinus bradycardia, blood pressure at that visit was 112/68.  Plan was to decrease his carvedilol  3.125 mg twice a day.  Patient was last seen in clinic on 05/20/2023 by Connor Louis, NP.  Patient reports that he lives in [place]  for 8 months out of the year in [place]  for 4 months.  Patient reported that since his carvedilol  has been decreased 3.125 mg twice daily his dizziness had completely improved.  He did note increased lower leg swelling that has started proximally 2 to 3 months prior, he reported the swelling improved with use of diabetic socks and resolved overnight when he laid flat.  He denied any  shortness of breath.  Patient reported an increase in his blood pressure readings since recovering from COVID-19 4 to 5 months prior.  He reported his blood pressure which had previously been well-controlled now averaged around 138 to 142 mmHg systolic.   Patient noted to have hypertensive emergency while in [place] , patient endorsed a slight frontal headache, denied any chest pain or shortness of breath. Per patient report his blood pressure was 197 systolic. Patient's losartan  was discontinued and he was started on amlodipine valsartan by his PCP.  Patient presents today with numerous updates and concerns, patient reports that he has been following with his PCP regarding ongoing hypertension, reports that it has been better controlled as of late.  Patient reports that he has been having increased fatigue, lower extremity edema, dyspnea on exertion, decreased appetite and weight loss.  In discussing with patient and son they note that his thyroid  medication was increased this morning and patient has been started on iron supplementation for low hemoglobin levels.  Patient denies any bleeding problems.  Patient reports that starting 3 months ago he started noticing increased dyspnea on exertion and swelling his legs and ankles.  On review and sounds around the same timeframe he was started on amlodipine-valsartan, unfortunately I am unable to review all of the notes from his PCP to verify the timeframe.  Patient reports that in the last 2 weeks he has started taking Lasix  daily with some improvement in lower extremity edema however notes that this does cause  him to have frequent urination resulting in decreased sleep.  Patient son notes concern as he does seem to fatigue easier and is eager to sit down and sleep.  Patient also reports that his blood pressure cuff at home regularly informs him that he has an irregular heartbeat however he denies any significant palpitations or feeling as though his heart is  beating fast.  Labwork independently reviewed: 02/16/2024: Hemoglobin 10.2, hematocrit 30.8, creatinine 1.72, sodium 143, potassium 4.1, AST 35, ALT 26, TSH 7.64 ROS: .   Today he denies chest pain, melena, hematuria, hemoptysis, diaphoresis, weakness, presyncope, syncope, orthopnea, and PND.  All Hansen systems are reviewed and otherwise negative. Studies Reviewed: SABRA   EKG:  EKG is ordered today, personally reviewed, demonstrating  EKG Interpretation Date/Time:  Tuesday February 17 2024 11:28:16 EDT Ventricular Rate:  58 PR Interval:  180 QRS Duration:  96 QT Interval:  424 QTC Calculation: 416 R Axis:   27  Text Interpretation: Sinus bradycardia Nonspecific ST and T wave abnormality Confirmed by Connor Hansen [id number] on 02/18/2024 2:32:17 PM   CV Studies: Cardiac studies reviewed are outlined and summarized above. Otherwise please see EMR for full report. Cardiac Studies & Procedures   ______________________________________________________________________________________________     ECHOCARDIOGRAM  ECHOCARDIOGRAM COMPLETE 04/27/2019  Narrative ECHOCARDIOGRAM REPORT    Patient Name:   Connor Hansen Date of Exam: 04/27/2019 Medical Rec #:  [id number]      Height:       69.0 in Accession #:    [id number]     Weight:       194.4 lb Date of Birth:  [birth year]      BSA:          2.04 m Patient Age:    85 years       BP:           154/77 mmHg Patient Gender: M              HR:           59 bpm. Exam Location:  [address]   Procedure: 2D Echo, Cardiac Doppler, Color Doppler and Intracardiac Opacification Agent  Indications:    I25.10 CAD  History:        Patient has prior history of Echocardiogram examinations, most recent 01/03/2009. Previous Myocardial Infarction Signs/Symptoms: Edema Risk Factors: HLD.  Sonographer:    Connor Hansen RCS Referring Phys: [id number] Connor Hansen  IMPRESSIONS   1. The left ventricle has mild-moderately reduced systolic function, with an ejection  fraction of 40-45%. The cavity size was normal. There is mildly increased left ventricular wall thickness. Left ventricular diastolic Doppler parameters are consistent with impaired relaxation. 2. The right ventricle has normal systolic function. The cavity was normal. 3. The tricuspid valve is grossly normal. 4. The aortic valve is tricuspid. Mild thickening of the aortic valve. No stenosis of the aortic valve. 5. The aorta is normal unless otherwise noted. 6. Definity  used; akinesis of the distal anteroseptal wall and apex; overall mild to moderate LV dysfunction; grade 1 diastolic dysfunction; mild LVH.  FINDINGS Left Ventricle: The left ventricle has mild-moderately reduced systolic function, with an ejection fraction of 40-45%. The cavity size was normal. There is mildly increased left ventricular wall thickness. Left ventricular diastolic Doppler parameters are consistent with impaired relaxation. Definity  contrast agent was given IV to delineate the left ventricular endocardial borders.  Right Ventricle: The right ventricle has normal systolic function. The cavity was normal.  Left Atrium: Left  atrial size was normal in size.  Right Atrium: Right atrial size was normal in size.  Interatrial Septum: No atrial level shunt detected by color flow Doppler.  Pericardium: There is no evidence of pericardial effusion.  Mitral Valve: The mitral valve is normal in structure. Mitral valve regurgitation is not visualized by color flow Doppler.  Tricuspid Valve: The tricuspid valve is grossly normal. Tricuspid valve regurgitation is trivial by color flow Doppler.  Aortic Valve: The aortic valve is tricuspid Mild thickening of the aortic valve. Aortic valve regurgitation was not visualized by color flow Doppler. There is No stenosis of the aortic valve.  Pulmonic Valve: The pulmonic valve was grossly normal. Pulmonic valve regurgitation is not visualized by color flow Doppler.  Aorta: The  aorta is normal unless otherwise noted.  Venous: The inferior vena cava is normal in size with greater than 50% respiratory variability.  Additional Comments: Definity  used; akinesis of the distal anteroseptal wall and apex; overall mild to moderate LV dysfunction; grade 1 diastolic dysfunction; mild LVH.   +--------------+--------++ LEFT VENTRICLE         +----------------+---------++ +--------------+--------++ Diastology                PLAX 2D                +----------------+---------++ +--------------+--------++ LV e' lateral:  6.85 cm/s LVIDd:        4.21 cm  +----------------+---------++ +--------------+--------++ LV E/e' lateral:10.1      LVIDs:        2.85 cm  +----------------+---------++ +--------------+--------++ LV e' medial:   6.20 cm/s LV PW:        1.33 cm  +----------------+---------++ +--------------+--------++ LV E/e' medial: 11.2      LV IVS:       1.24 cm  +----------------+---------++ +--------------+--------++ LVOT diam:    2.10 cm  +--------------+--------++ LV SV:        48 ml    +--------------+--------++ LV SV Index:  23.02    +--------------+--------++ LVOT Area:    3.46 cm +--------------+--------++                        +--------------+--------++  +---------------+----------++ RIGHT VENTRICLE           +---------------+----------++ RV Basal diam: 3.29 cm    +---------------+----------++ RV S prime:    11.20 cm/s +---------------+----------++ TAPSE (M-mode):1.8 cm     +---------------+----------++  +---------------+-------++-----------++ LEFT ATRIUM           Index       +---------------+-------++-----------++ LA diam:       3.40 cm1.67 cm/m  +---------------+-------++-----------++ LA Vol (A2C):  41.1 ml20.14 ml/m +---------------+-------++-----------++ LA Vol (A4C):  51.6 ml25.28 ml/m +---------------+-------++-----------++ LA  Biplane Vol:49.5 ml24.25 ml/m +---------------+-------++-----------++ +------------+---------++-----------++ RIGHT ATRIUM         Index       +------------+---------++-----------++ RA Area:    12.50 cm            +------------+---------++-----------++ RA Volume:  26.60 ml 13.03 ml/m +------------+---------++-----------++ +------------+-----------++ AORTIC VALVE            +------------+-----------++ LVOT Vmax:  93.10 cm/s  +------------+-----------++ LVOT Vmean: 68.100 cm/s +------------+-----------++ LVOT VTI:   0.213 m     +------------+-----------++  +-------------+-------++ AORTA                +-------------+-------++ Ao Root diam:3.00 cm +-------------+-------++  +--------------+--------++ MITRAL VALVE              +--------------+-------+ +--------------+--------++  SHUNTS                MV Area (PHT):            +--------------+-------+ +--------------+--------++    Systemic VTI: 0.21 m  MV PHT:                   +--------------+-------+ +--------------+--------++    Systemic Diam:2.10 cm MV Decel Time:218 msec    +--------------+-------+ +--------------+--------++ +--------------+-----------++ MV E velocity:69.40 cm/s  +--------------+-----------++ MV A velocity:100.00 cm/s +--------------+-----------++ MV E/A ratio: 0.69        +--------------+-----------++   Redell Shallow MD Electronically signed by Redell Shallow MD Signature Date/Time: 04/27/2019/1:54:57 PM    Final          ______________________________________________________________________________________________       Current Reported Medications:.    Current Meds  Medication Sig   allopurinol (ZYLOPRIM) 100 MG tablet Take 100 mg by mouth daily.   amLODipine-valsartan (EXFORGE) 10-160 MG tablet Take 1 tablet by mouth daily.   aspirin 81 MG tablet Take 81 mg by mouth daily.   atorvastatin   (LIPITOR) 80 MG tablet Take 1 tablet (80 mg total) by mouth daily.   calcium -vitamin D (OSCAL-500) 500-400 MG-UNIT tablet Take 1 tablet by mouth daily.   carvedilol  (COREG ) 3.125 MG tablet Take 1 tablet (3.125 mg total) by mouth 2 (two) times daily.   Cholecalciferol (VITAMIN D) 400 UNITS capsule Take 400 Units by mouth daily.   doxazosin (CARDURA) 1 MG tablet Take 1 mg by mouth daily.   etanercept (ENBREL SURECLICK) 50 MG/ML injection Inject 50 mg into the skin once a week.   famotidine (PEPCID) 20 MG tablet Take 20 mg by mouth 2 (two) times daily.   finasteride (PROSCAR) 5 MG tablet Take 5 mg by mouth daily.   folic acid (FOLVITE) 1 MG tablet Take 2 mg by mouth 2 (two) times daily.   furosemide  (LASIX ) 20 MG tablet Take 1 tablet (20 mg total) by mouth daily as needed for fluid.   leflunomide (ARAVA) 20 MG tablet Take 1 tablet by mouth at bedtime.   levothyroxine (SYNTHROID, LEVOTHROID) 75 MCG tablet Take 75 mcg by mouth daily before breakfast.   methotrexate (RHEUMATREX) 2.5 MG tablet Take 20 mg by mouth once a week. Caution:Chemotherapy. Protect from light.   Multiple Vitamin (MULTIVITAMIN) tablet Take 1 tablet by mouth daily.   Multiple Vitamins-Minerals (PRESERVISION/LUTEIN PO) Take 500 mg by mouth daily.   Omega-3 Fatty Acids (FISH OIL PO) Take 1,000 capsules by mouth daily.   omeprazole (PRILOSEC) 20 MG capsule Take 20 mg by mouth daily.   traMADol (ULTRAM) 50 MG tablet Take 50 mg by mouth as needed.   [DISCONTINUED] losartan  (COZAAR ) 50 MG tablet Take 1 tablet (50 mg total) by mouth daily.   Physical Exam:   VS:  BP 136/72   Pulse (!) 58   Ht 5' 9 (1.753 m)   Wt 173 lb (78.5 kg)   SpO2 97%   BMI 25.55 kg/m    Wt Readings from Last 3 Encounters:  02/17/24 173 lb (78.5 kg)  05/20/23 179 lb (81.2 kg)  12/03/22 177 lb 9.6 oz (80.6 kg)    GEN: Well nourished, well developed in no acute distress NECK: No JVD; No carotid bruits CARDIAC: RRR, no murmurs, rubs,  gallops RESPIRATORY:  Clear to auscultation without rales, wheezing or rhonchi  ABDOMEN: Soft, non-tender, non-distended EXTREMITIES:  Mild nonpitting ankle edema; No acute deformity     Asessement and Plan:.  Fatigue/anemia/hypothyroidism: Patient's most recent hemoglobin was 10, he has been started on iron supplementation.  Patient's TSH was also elevated and his dose of Synthroid was increased.  Patient reports that in the last few months he has had increased fatigue and decreased activity tolerance.  Patient denies any chest pain.  Will check echocardiogram and cardiac monitor as noted below.  HF/DOE/Lower extremity edema: Echocardiogram in 04/2019 indicated LVEF of 40 to 45%, mildly increased left wall thickness, RV systolic function and size was normal, no evidence of aortic valve stenosis.  Today patient reports increased shortness of breath, weight loss, denies any orthopnea or PND.  He denies any chest pain.  Patient notes intermittent lower extremity edema, reports that his ankles are swollen today, on review patient has mild nonpitting ankle edema.  He overall appears euvolemic and well compensated on exam.  Patient son reports that he was started on amlodipine-valsartan a few months ago, has noted some increased lower extremity edema following.  Patient reports that lower extremity edema progresses throughout the day and improves overnight with elevation.  Question of some lower extremity edema is related to increased use of amlodipine as he was previously not on this medication.  Will check echocardiogram as noted above, likely will work to increase GDMT.  Irregular heart rhythm/sinus bradycardia: Patient reports that his blood pressure cuff at home has reported that he has an irregular heartbeat, he denies any significant palpitations.  Patient is on carvedilol  and has noted increased fatigue, has history of bradycardia with heart rates in the 50s.  Will check cardiac monitor.  CAD:  Patient with BMS to LAD and RCA in 2010.  Today he denies chest pain, endorses dyspnea on exertion.  Checking echocardiogram as noted above, may need to consider ischemic evaluation if overall unrevealing. Continue aspirin 81 mg daily, atorvastatin  80 mg daily, carvedilol  3.125 mg twice daily.   Hypertension: Initial blood pressure today 147/66, on recheck was 136/72.  Patient reports that his PCP has been adjusting his blood pressure medications and has been started on amlodipine-valsartan, dose itching is overall unclear.  Patient reports he is also been started on doxazosin.  We have asked that he confirm dosages.  May be able to consider transitioning to Entresto pending echocardiogram.  Chronic superficial DVT Right lower extremity: Patient diagnosed with chronic DVT in 2020 via vascular ultrasound, vascular's recommendations were to continue aspirin 81 mg daily, TED hose/compression stockings and Lasix .  Hyperlipidemia: Last lipid profile on 09/17/2023 indicated total cholesterol 119, HDL 35, triglycerides 187 and LDL 53.  Continue atorvastatin  80 mg daily.    Disposition: F/u with Aldair Rickel, NP in 8 weeks.   Signed, Helvi Royals D Emanual Lamountain, NP

## 2024-02-17 ENCOUNTER — Ambulatory Visit

## 2024-02-17 ENCOUNTER — Ambulatory Visit: Attending: Cardiology | Admitting: Cardiology

## 2024-02-17 ENCOUNTER — Encounter: Payer: Self-pay | Admitting: Cardiology

## 2024-02-17 VITALS — BP 136/72 | HR 58 | Ht 69.0 in | Wt 173.0 lb

## 2024-02-17 DIAGNOSIS — I10 Essential (primary) hypertension: Secondary | ICD-10-CM

## 2024-02-17 DIAGNOSIS — E782 Mixed hyperlipidemia: Secondary | ICD-10-CM | POA: Diagnosis not present

## 2024-02-17 DIAGNOSIS — I2510 Atherosclerotic heart disease of native coronary artery without angina pectoris: Secondary | ICD-10-CM

## 2024-02-17 DIAGNOSIS — R0602 Shortness of breath: Secondary | ICD-10-CM

## 2024-02-17 DIAGNOSIS — R002 Palpitations: Secondary | ICD-10-CM

## 2024-02-17 DIAGNOSIS — R5383 Other fatigue: Secondary | ICD-10-CM

## 2024-02-17 DIAGNOSIS — I824Y9 Acute embolism and thrombosis of unspecified deep veins of unspecified proximal lower extremity: Secondary | ICD-10-CM

## 2024-02-17 DIAGNOSIS — R600 Localized edema: Secondary | ICD-10-CM

## 2024-02-17 NOTE — Progress Notes (Unsigned)
 Enrolled patient for a 7 day Zio XT monitor to be mailed to patients home   [place]ains to read

## 2024-02-17 NOTE — Patient Instructions (Signed)
 Medication Instructions:  Your physician recommends that you continue on your current medications as directed. Please refer to the Current Medication list given to you today.  *If you need a refill on your cardiac medications before your next appointment, please call your pharmacy*  Lab Work: NONE ordered at this time of appointment   Testing/Procedures: Your physician has requested that you have an echocardiogram. Echocardiography is a painless test that uses sound waves to create images of your heart. It provides your doctor with information about the size and shape of your heart and how well your heart's chambers and valves are working. This procedure takes approximately one hour. There are no restrictions for this procedure. Please do NOT wear cologne, perfume, aftershave, or lotions (deodorant is allowed). Please arrive 15 minutes prior to your appointment time.  Please note: We ask at that you not bring children with you during ultrasound (echo/ vascular) testing. Due to room size and safety concerns, children are not allowed in the ultrasound rooms during exams. Our front office staff cannot provide observation of children in our lobby area while testing is being conducted. An adult accompanying a patient to their appointment will only be allowed in the ultrasound room at the discretion of the ultrasound technician under special circumstances. We apologize for any inconvenience.   ZIO XT- Long Term Monitor Instructions  Your physician has requested you wear a ZIO patch monitor for 7 days.  This is a single patch monitor. [employer] supplies one patch monitor per enrollment. Additional stickers are not available. Please do not apply patch if you will be having a Nuclear Stress Test,  Echocardiogram, Cardiac CT, MRI, or Chest Xray during the period you would be wearing the  monitor. The patch cannot be worn during these tests. You cannot remove and re-apply the  ZIO XT patch monitor.  Your  ZIO patch monitor will be mailed 3 day USPS to your address on file. It may take 3-5 days  to receive your monitor after you have been enrolled.  Once you have received your monitor, please review the enclosed instructions. Your monitor  has already been registered assigning a specific monitor serial # to you.  Billing and Patient Assistance Program Information  We have supplied [employer] with any of your insurance information on file for billing purposes. [employer] offers a sliding scale Patient Assistance Program for patients that do not have  insurance, or whose insurance does not completely cover the cost of the ZIO monitor.  You must apply for the Patient Assistance Program to qualify for this discounted rate.  To apply, please call [employer] at [phone], select option 4, select option 2, ask to apply for  Patient Assistance Program. Meredeth will ask your household income, and how many people  are in your household. They will quote your out-of-pocket cost based on that information.  [employer] will also be able to set up a 108-month, interest-free payment plan if needed.  Applying the monitor   Shave hair from upper left chest.  Hold abrader disc by orange tab. Rub abrader in 40 strokes over the upper left chest as  indicated in your monitor instructions.  Clean area with 4 enclosed alcohol pads. Let dry.  Apply patch as indicated in monitor instructions. Patch will be placed under collarbone on left  side of chest with arrow pointing upward.  Rub patch adhesive wings for 2 minutes. Remove white label marked 1. Remove the white  label marked 2. Rub patch adhesive wings for 2  additional minutes.  While looking in a mirror, press and release button in center of patch. A small green light will  flash 3-4 times. This will be your only indicator that the monitor has been turned on.  Do not shower for the first 24 hours. You may shower after the first 24 hours.  Press the button if you feel  a symptom. You will hear a small click. Record Date, Time and  Symptom in the Patient Logbook.  When you are ready to remove the patch, follow instructions on the last 2 pages of Patient  Logbook. Stick patch monitor onto the last page of Patient Logbook.  Place Patient Logbook in the blue and white box. Use locking tab on box and tape box closed  securely. The blue and white box has prepaid postage on it. Please place it in the mailbox as  soon as possible. Your physician should have your test results approximately 7 days after the  monitor has been mailed back to [facility].  Call [facility] Customer Care at [phone] if you have questions regarding  your ZIO XT patch monitor. Call them immediately if you see an orange light blinking on your  monitor.  If your monitor falls off in less than 4 days, contact our Monitor department at [phone].  If your monitor becomes loose or falls off after 4 days call [employer] at [phone] for  suggestions on securing your monitor   Follow-Up: At [facility], you and your health needs are our priority.  As part of our continuing mission to provide you with exceptional heart care, our providers are all part of one team.  This team includes your primary Cardiologist (physician) and Advanced Practice Providers or APPs (Physician Assistants and Nurse Practitioners) who all work together to provide you with the care you need, when you need it.  Your next appointment:   8 week(s)  Provider:   Katlyn West, NP          We recommend signing up for the patient portal called MyChart.  Sign up information is provided on this After Visit Summary.  MyChart is used to connect with patients for Virtual Visits (Telemedicine).  Patients are able to view lab/test results, encounter notes, upcoming appointments, etc.  Non-urgent messages can be sent to your provider as well.   To learn more about what you can do with MyChart, go to  [email or web address].

## 2024-02-19 ENCOUNTER — Encounter: Payer: Self-pay | Admitting: Cardiology

## 2024-03-02 ENCOUNTER — Emergency Department (HOSPITAL_BASED_OUTPATIENT_CLINIC_OR_DEPARTMENT_OTHER)

## 2024-03-02 ENCOUNTER — Emergency Department (HOSPITAL_BASED_OUTPATIENT_CLINIC_OR_DEPARTMENT_OTHER)
Admission: EM | Admit: 2024-03-02 | Discharge: 2024-03-02 | Disposition: A | Discharge status: Home / Self Care | Attending: Emergency Medicine | Admitting: Emergency Medicine

## 2024-03-02 ENCOUNTER — Encounter (HOSPITAL_BASED_OUTPATIENT_CLINIC_OR_DEPARTMENT_OTHER): Payer: Self-pay | Admitting: Radiology

## 2024-03-02 ENCOUNTER — Other Ambulatory Visit: Payer: Self-pay

## 2024-03-02 DIAGNOSIS — Z7982 Long term (current) use of aspirin: Secondary | ICD-10-CM | POA: Diagnosis not present

## 2024-03-02 DIAGNOSIS — I2510 Atherosclerotic heart disease of native coronary artery without angina pectoris: Secondary | ICD-10-CM | POA: Diagnosis not present

## 2024-03-02 DIAGNOSIS — E039 Hypothyroidism, unspecified: Secondary | ICD-10-CM | POA: Diagnosis not present

## 2024-03-02 DIAGNOSIS — N189 Chronic kidney disease, unspecified: Secondary | ICD-10-CM | POA: Insufficient documentation

## 2024-03-02 DIAGNOSIS — R339 Retention of urine, unspecified: Secondary | ICD-10-CM

## 2024-03-02 DIAGNOSIS — M898X8 Other specified disorders of bone, other site: Secondary | ICD-10-CM | POA: Diagnosis not present

## 2024-03-02 DIAGNOSIS — K4090 Unilateral inguinal hernia, without obstruction or gangrene, not specified as recurrent: Secondary | ICD-10-CM | POA: Diagnosis not present

## 2024-03-02 DIAGNOSIS — M898X9 Other specified disorders of bone, unspecified site: Secondary | ICD-10-CM | POA: Insufficient documentation

## 2024-03-02 DIAGNOSIS — N400 Enlarged prostate without lower urinary tract symptoms: Secondary | ICD-10-CM | POA: Diagnosis not present

## 2024-03-02 DIAGNOSIS — I1310 Hypertensive heart and chronic kidney disease without heart failure, with stage 1 through stage 4 chronic kidney disease, or unspecified chronic kidney disease: Secondary | ICD-10-CM | POA: Diagnosis not present

## 2024-03-02 DIAGNOSIS — N281 Cyst of kidney, acquired: Secondary | ICD-10-CM | POA: Diagnosis not present

## 2024-03-02 DIAGNOSIS — N1330 Unspecified hydronephrosis: Secondary | ICD-10-CM | POA: Insufficient documentation

## 2024-03-02 DIAGNOSIS — Z79899 Other long term (current) drug therapy: Secondary | ICD-10-CM | POA: Diagnosis not present

## 2024-03-02 DIAGNOSIS — D649 Anemia, unspecified: Secondary | ICD-10-CM | POA: Insufficient documentation

## 2024-03-02 DIAGNOSIS — N3289 Other specified disorders of bladder: Secondary | ICD-10-CM | POA: Insufficient documentation

## 2024-03-02 DIAGNOSIS — R109 Unspecified abdominal pain: Secondary | ICD-10-CM | POA: Diagnosis not present

## 2024-03-02 DIAGNOSIS — Y846 Urinary catheterization as the cause of abnormal reaction of the patient, or of later complication, without mention of misadventure at the time of the procedure: Secondary | ICD-10-CM | POA: Insufficient documentation

## 2024-03-02 DIAGNOSIS — I10 Essential (primary) hypertension: Secondary | ICD-10-CM | POA: Diagnosis not present

## 2024-03-02 LAB — URINALYSIS, ROUTINE W REFLEX MICROSCOPIC
Bilirubin Urine: NEGATIVE
Glucose, UA: NEGATIVE mg/dL
Hgb urine dipstick: NEGATIVE
Ketones, ur: NEGATIVE mg/dL
Leukocytes,Ua: NEGATIVE
Nitrite: NEGATIVE
Protein, ur: NEGATIVE mg/dL
Specific Gravity, Urine: 1.015 (ref 1.005–1.030)
pH: 5.5 (ref 5.0–8.0)

## 2024-03-02 LAB — COMPREHENSIVE METABOLIC PANEL WITH GFR
ALT: 15 U/L (ref 0–44)
AST: 35 U/L (ref 15–41)
Albumin: 4.4 g/dL (ref 3.5–5.0)
Alkaline Phosphatase: 267 U/L — ABNORMAL HIGH (ref 38–126)
Anion gap: 15 (ref 5–15)
BUN: 18 mg/dL (ref 8–23)
CO2: 24 mmol/L (ref 22–32)
Calcium: 9.1 mg/dL (ref 8.9–10.3)
Chloride: 104 mmol/L (ref 98–111)
Creatinine, Ser: 1.89 mg/dL — ABNORMAL HIGH (ref 0.61–1.24)
GFR, Estimated: 34 mL/min — ABNORMAL LOW
Glucose, Bld: 94 mg/dL (ref 70–99)
Potassium: 3.9 mmol/L (ref 3.5–5.1)
Sodium: 143 mmol/L (ref 135–145)
Total Bilirubin: 0.4 mg/dL (ref 0.0–1.2)
Total Protein: 7 g/dL (ref 6.5–8.1)

## 2024-03-02 LAB — CBC
HCT: 29.1 % — ABNORMAL LOW (ref 39.0–52.0)
Hemoglobin: 9.4 g/dL — ABNORMAL LOW (ref 13.0–17.0)
MCH: 30.2 pg (ref 26.0–34.0)
MCHC: 32.3 g/dL (ref 30.0–36.0)
MCV: 93.6 fL (ref 80.0–100.0)
Platelets: 193 K/uL (ref 150–400)
RBC: 3.11 MIL/uL — ABNORMAL LOW (ref 4.22–5.81)
RDW: 16.1 % — ABNORMAL HIGH (ref 11.5–15.5)
WBC: 6.4 K/uL (ref 4.0–10.5)
nRBC: 0 % (ref 0.0–0.2)

## 2024-03-02 LAB — LIPASE, BLOOD: Lipase: 38 U/L (ref 11–51)

## 2024-03-02 MED ORDER — IOHEXOL 300 MG/ML  SOLN
80.0000 mL | Freq: Once | INTRAMUSCULAR | Status: AC | PRN
  Administered 2024-03-02: 80 mL via INTRAVENOUS

## 2024-03-02 MED ORDER — FENTANYL CITRATE PF 50 MCG/ML IJ SOSY
50.0000 ug | PREFILLED_SYRINGE | Freq: Once | INTRAMUSCULAR | Status: AC
  Administered 2024-03-02: 50 ug via INTRAVENOUS
  Filled 2024-03-02: qty 1

## 2024-03-02 MED ORDER — LORAZEPAM 2 MG/ML IJ SOLN
1.0000 mg | Freq: Once | INTRAMUSCULAR | Status: AC
  Administered 2024-03-02: 1 mg via INTRAVENOUS
  Filled 2024-03-02: qty 1

## 2024-03-02 MED ORDER — LIDOCAINE HCL URETHRAL/MUCOSAL 2 % EX GEL
1.0000 | Freq: Once | CUTANEOUS | Status: AC
  Administered 2024-03-02: 1 via URETHRAL
  Filled 2024-03-02: qty 11

## 2024-03-02 NOTE — ED Notes (Signed)
 Pt does not want the Foley procedure done due to fear of the procedure and not feeling it is indicated. Pt is afraid of the pain and anxious about it. EDP and myself explained the procedure and the risks of refusal for not doing it, and gave him time to talk to his family. EDP to return and discuss more and let patient decide.

## 2024-03-02 NOTE — ED Notes (Signed)
 PT attempting to obtain UA with bedside urinal. Able to sit up and attempt at bedside.

## 2024-03-02 NOTE — ED Notes (Signed)
Pt assisted w/urinal.

## 2024-03-02 NOTE — ED Triage Notes (Signed)
 Pt states that he is having abdominal pain and back pain that started about 4 days ago. First noticed back pain then it moved to abd.Family states that he has had shortness of breath for several months and he is scheduled for a echo for August 19th. He has had fatigue since the shortness of breath started but has not gotten worse. He does take lasix  so he urinates a lot but recently had labs checked and they were normal. Today his main concern is the back and belly pain.

## 2024-03-02 NOTE — ED Provider Notes (Signed)
 [facility] [facility] Provider Note   CSN: [id number] Arrival date & time: 03/02/24  1608     Patient presents with: Abdominal Pain and Back Pain   Connor Hansen is a 89 y.o. male.   Patient is 89 year old who presents with abdominal and back pain.  He has a history of coronary artery disease status post stent placement, hypertension, hyperlipidemia, prior DVT.  He said 4 days ago he started having pain across his lower back.  It is bilaterally.  It then moved to his abdomen and now its mostly in his abdomen.  Its across both sides of his abdomen.  He is had some associated nausea and vomiting with the last vomiting being yesterday.  He has had some diarrhea alternating with constipation which is typical for him.  He denies any fevers.  No new urinary symptoms.  He does urinate frequently and has a known enlarged prostate but no recent changes.  He has had some recent increased fatigue, dyspnea on exertion and leg swelling which has been going on for the last several months.  He lives in [place]  for most of the year but sometimes comes to [place]  to see his doctors.  He recently came to stay with his family because of these new symptoms.  He did see cardiology earlier this month.  He has an echocardiogram scheduled next week.  He was noted to have anemia and was started on iron supplementation by his PCP.  He also has been working with his blood pressure medications to have his blood pressure under better control.  He had his thyroid  medications adjusted recently as well.  He is status post cholecystectomy in the past.  He denies any recent changes in his shortness of breath and leg swelling.  He denies any fevers.  No associated chest pain.       Prior to Admission medications   Medication Sig Start Date End Date Taking? Authorizing Provider  allopurinol (ZYLOPRIM) 100 MG tablet Take 100 mg by mouth daily.    [provider]   amLODipine-valsartan (EXFORGE) 10-160 MG tablet Take 1 tablet by mouth daily. 11/26/23   [provider]  aspirin 81 MG tablet Take 81 mg by mouth daily.    [provider]  atorvastatin  (LIPITOR) 80 MG tablet Take 1 tablet (80 mg total) by mouth daily. 05/20/23   Lucien Orren SAILOR, PA-C  calcium -vitamin D (OSCAL-500) 500-400 MG-UNIT tablet Take 1 tablet by mouth daily.    [provider]  carvedilol  (COREG ) 3.125 MG tablet Take 1 tablet (3.125 mg total) by mouth 2 (two) times daily. 12/29/23   Lucien Orren SAILOR, PA-C  Cholecalciferol (VITAMIN D) 400 UNITS capsule Take 400 Units by mouth daily.    [provider]  doxazosin (CARDURA) 1 MG tablet Take 1 mg by mouth daily. 11/26/23   [provider]  etanercept (ENBREL SURECLICK) 50 MG/ML injection Inject 50 mg into the skin once a week.    [provider]  famotidine (PEPCID) 20 MG tablet Take 20 mg by mouth 2 (two) times daily.    [provider]  finasteride (PROSCAR) 5 MG tablet Take 5 mg by mouth daily. 05/30/23   [provider]  folic acid (FOLVITE) 1 MG tablet Take 2 mg by mouth 2 (two) times daily.    [provider]  furosemide  (LASIX ) 20 MG tablet Take 1 tablet (20 mg total) by mouth daily as needed for fluid. 05/20/23 02/17/24  Rana, Madison L, NP  leflunomide (ARAVA) 20 MG tablet Take 1 tablet by mouth at bedtime. 01/06/18   [provider]  levothyroxine (SYNTHROID, LEVOTHROID) 75 MCG tablet Take 75 mcg by mouth daily before breakfast.    [provider]  methotrexate (RHEUMATREX) 2.5 MG tablet Take 20 mg by mouth once a week. Caution:Chemotherapy. Protect from light.    [provider]  Multiple Vitamin (MULTIVITAMIN) tablet Take 1 tablet by mouth daily.    [provider]  Multiple Vitamins-Minerals (PRESERVISION/LUTEIN PO) Take 500 mg by mouth daily.    [provider]  Omega-3 Fatty Acids (FISH OIL PO) Take 1,000  capsules by mouth daily.    [provider]  omeprazole (PRILOSEC) 20 MG capsule Take 20 mg by mouth daily.    [provider]  traMADol (ULTRAM) 50 MG tablet Take 50 mg by mouth as needed.    [provider]    Allergies: Patient has no known allergies.    Review of Systems  Constitutional:  Positive for fatigue. Negative for chills, diaphoresis and fever.  HENT:  Negative for congestion, rhinorrhea and sneezing.   Eyes: Negative.   Respiratory:  Positive for shortness of breath. Negative for cough and chest tightness.   Cardiovascular:  Positive for leg swelling. Negative for chest pain.  Gastrointestinal:  Positive for abdominal pain, constipation, diarrhea, nausea and vomiting. Negative for blood in stool.  Genitourinary:  Positive for frequency. Negative for difficulty urinating, flank pain and hematuria.  Musculoskeletal:  Negative for arthralgias and back pain.  Skin:  Negative for rash.  Neurological:  Negative for dizziness, speech difficulty, weakness, numbness and headaches.    Updated Vital Signs BP 116/77 (BP Location: Left Arm)   Pulse 66   Temp 98.9 F (37.2 C) (Oral)   Resp 17   Ht 5' 9 (1.753 m)   Wt 78.5 kg   SpO2 99%   BMI 25.55 kg/m   Physical Exam Constitutional:      Appearance: He is well-developed.  HENT:     Head: Normocephalic and atraumatic.  Eyes:     Pupils: Pupils are equal, round, and reactive to light.  Cardiovascular:     Rate and Rhythm: Normal rate and regular rhythm.     Heart sounds: Normal heart sounds.  Pulmonary:     Effort: Pulmonary effort is normal. No respiratory distress.     Breath sounds: Normal breath sounds. No wheezing or rales.  Chest:     Chest wall: No tenderness.  Abdominal:     General: Bowel sounds are normal.     Palpations: Abdomen is soft.     Tenderness: There is abdominal tenderness in the right lower quadrant and periumbilical area. There is no guarding or rebound.   Musculoskeletal:        General: Normal range of motion.     Cervical back: Normal range of motion and neck supple.  Lymphadenopathy:     Cervical: No cervical adenopathy.  Skin:    General: Skin is warm and dry.     Findings: No rash.     Comments: Trace edema to lower extremities bilaterally, no calf tenderness  Neurological:     Mental Status: He is alert and oriented to person, place, and time.     (all labs ordered are listed, but only abnormal results are displayed) Labs Reviewed  COMPREHENSIVE METABOLIC PANEL WITH GFR - Abnormal; Notable for the following components:      Result Value  Creatinine, Ser 1.89 (*)    Alkaline Phosphatase 267 (*)    GFR, Estimated 34 (*)    All other components within normal limits  CBC - Abnormal; Notable for the following components:   RBC 3.11 (*)    Hemoglobin 9.4 (*)    HCT 29.1 (*)    RDW 16.1 (*)    All other components within normal limits  LIPASE, BLOOD  URINALYSIS, ROUTINE W REFLEX MICROSCOPIC    EKG: EKG Interpretation Date/Time:  Tuesday March 02 2024 16:23:10 EDT Ventricular Rate:  81 PR Interval:  164 QRS Duration:  98 QT Interval:  382 QTC Calculation: 444 R Axis:   -19  Text Interpretation: Sinus rhythm Atrial premature complex Anterolateral infarct, old since last tracing no significant change Confirmed by Lenor Hollering [phone]) on 03/02/2024 4:26:41 PM  Radiology: CT ABDOMEN PELVIS W CONTRAST Result Date: 03/02/2024 CLINICAL DATA:  Right lower quadrant pain EXAM: CT ABDOMEN AND PELVIS WITH CONTRAST TECHNIQUE: Multidetector CT imaging of the abdomen and pelvis was performed using the standard protocol following bolus administration of intravenous contrast. RADIATION DOSE REDUCTION: This exam was performed according to the departmental dose-optimization program which includes automated exposure control, adjustment of the mA and/or kV according to patient size and/or use of iterative reconstruction technique. CONTRAST:   80mL OMNIPAQUE  IOHEXOL  300 MG/ML  SOLN COMPARISON:  None Available. FINDINGS: Lower chest: Lung bases demonstrate possible small irregular 5 mm lingular nodule on series 6, image 1. 6 mm anterior right lower lobe pulmonary nodule on series 6, image 9. Mild subpleural reticulation and emphysema. No acute airspace disease. Hepatobiliary: Cholecystectomy. No focal hepatic abnormality. No biliary dilatation Pancreas: Unremarkable. No pancreatic ductal dilatation or surrounding inflammatory changes. Spleen: Splenic granuloma Adrenals/Urinary Tract: Adrenal glands are normal. 4.1 cm cyst at the midpole left kidney, no imaging follow-up recommended. Moderate to marked bilateral hydronephrosis and hydroureter without obstructing stone. Distended urinary bladder. Slightly diminished contrast excretion of the left greater than right kidneys, possibly due to obstructive change. Favor intrarenal vascular calcification over small kidney stones. Stomach/Bowel: Stomach nonenlarged. No dilated small bowel. No acute bowel wall thickening. Negative appendix. Moderate stool in the right colon. Vascular/Lymphatic: Advanced aortic atherosclerosis. No aneurysm. No suspicious lymph nodes. Reproductive: Enlarged prostate with mass effect on the bladder Other: Negative for pelvic effusion or free air. Small fat containing left inguinal hernia Musculoskeletal: Diffuse heterogeneous sclerosis involving the ribs, spine, and pelvis. IMPRESSION: 1. Moderate to marked bilateral hydronephrosis and hydroureter without obstructing stone. Distended urinary bladder. Enlarged prostate with mass effect on the bladder. Findings could be secondary to bladder outlet obstruction. 2. Diffuse heterogeneous sclerosis involving the ribs, spine, and pelvis, findings are suspicious for osseous metastatic disease. 3. Enlarged prostate with mass effect on the bladder, correlate with PSA 4. There are a few small pulmonary nodules at the lung bases measuring up to 6  mm. These are indeterminate given findings suspicious for skeletal metastatic disease. 5. Aortic atherosclerosis. Aortic Atherosclerosis (ICD10-I70.0) and Emphysema (ICD10-J43.9). Electronically Signed   By: Luke Bun M.D.   On: 03/02/2024 19:13     Procedures   Medications Ordered in the ED  fentaNYL  (SUBLIMAZE ) injection 50 mcg (50 mcg Intravenous Given 03/02/24 1738)  iohexol  (OMNIPAQUE ) 300 MG/ML solution 80 mL (80 mLs Intravenous Contrast Given 03/02/24 1844)  fentaNYL  (SUBLIMAZE ) injection 50 mcg (50 mcg Intravenous Given 03/02/24 2001)  lidocaine  (XYLOCAINE ) 2 % jelly 1 Application (1 Application Urethral Given 03/02/24 2033)  LORazepam  (ATIVAN ) injection 1 mg (1 mg Intravenous Given  03/02/24 2032)                                    Medical Decision Making Amount and/or Complexity of Data Reviewed Labs: ordered. Radiology: ordered.  Risk Prescription drug management.   This patient presents to the ED for concern of abdominal and back pain., this involves an extensive number of treatment options, and is a complaint that carries with it a high risk of complications and morbidity.  I considered the following differential and admission for this acute, potentially life threatening condition.  The differential diagnosis includes bowel obstruction, appendicitis, kidney stone, UTI, colitis, AAA  MDM:    Patient is a 89 year old who presents with lower abdominal pain associated with back pain.  On exam, he is tender in the right lower quadrant.  Urinalysis is not concerning for infection.  Labs show a bit of anemia.  On chart review, his last hemoglobin from earlier this month was 10.2.  It slightly lower today.  His creatinine is also mildly elevated.  His last creatinine was 1.72.  CT scan shows an enlarged bladder with marked hydronephrosis.  His prostate is markedly enlarged with possible bladder outlet obstruction.  Foley catheter was placed.  There is about 300 cc or so of urine  return.  Will be discharged with a leg bag.  His abdominal pain improved.  His CT scan was also concerning for bony metastases.  I am concerned that he potentially has prostate cancer given his markedly enlarged prostate and reported elevated PSAs per family.  Encouraged him to have close follow-up with urology.  Family will call tomorrow to make an appointment.  Also will follow-up with her PCP to further coordinate care.  Patient was discharged home in good condition.  Return precautions were given.  (Labs, imaging, consults)  Labs: I Ordered, and personally interpreted labs.  The pertinent results include: Elevated creatinine, anemia, urine is normal, elevated AST  Imaging Studies ordered: I ordered imaging studies including CT abdomen pelvis I independently visualized and interpreted imaging. I agree with the radiologist interpretation  Additional history obtained from chart.  External records from outside source obtained and reviewed including prior notes  Cardiac Monitoring: The patient was maintained on a cardiac monitor.  If on the cardiac monitor, I personally viewed and interpreted the cardiac monitored which showed an underlying rhythm of: Sinus rhythm  Reevaluation: After the interventions noted above, I reevaluated the patient and found that they have :improved  Social Determinants of Health:    Disposition: Discharged to home with family  Co morbidities that complicate the patient evaluation  Past Medical History:  Diagnosis Date   CAD (coronary artery disease)    2/10 - BMS to mid LAD, RCA. Anteroapical akinesis - normal EF. 2010   CKD (chronic kidney disease)    Coronary atherosclerosis of native coronary artery    GERD (gastroesophageal reflux disease)    HTN (hypertension)    Hyperlipidemia    Hypothyroidism    Old myocardial infarct    RA (rheumatoid arthritis) (HCC)      Medicines Meds ordered this encounter  Medications   fentaNYL  (SUBLIMAZE ) injection  50 mcg   iohexol  (OMNIPAQUE ) 300 MG/ML solution 80 mL   fentaNYL  (SUBLIMAZE ) injection 50 mcg   lidocaine  (XYLOCAINE ) 2 % jelly 1 Application   LORazepam  (ATIVAN ) injection 1 mg    I have reviewed the patients home medicines and have  made adjustments as needed  Problem List / ED Course: Problem List Items Addressed This Visit   None Visit Diagnoses       Urinary retention    -  Primary     Lytic bone lesions on xray         Anemia, unspecified type                    Final diagnoses:  Urinary retention  Lytic bone lesions on xray  Anemia, unspecified type    ED Discharge Orders     None          Lenor Hollering, MD 03/02/24 2229

## 2024-03-02 NOTE — ED Notes (Signed)
 Educated on foley bag, care of a foley catheter, and how to change out from standard drainage bag to leg bag.

## 2024-03-03 DIAGNOSIS — R002 Palpitations: Secondary | ICD-10-CM | POA: Diagnosis not present

## 2024-03-05 DIAGNOSIS — N130 Hydronephrosis with ureteropelvic junction obstruction: Secondary | ICD-10-CM | POA: Diagnosis not present

## 2024-03-05 DIAGNOSIS — C61 Malignant neoplasm of prostate: Secondary | ICD-10-CM | POA: Diagnosis not present

## 2024-03-05 DIAGNOSIS — C7951 Secondary malignant neoplasm of bone: Secondary | ICD-10-CM | POA: Diagnosis not present

## 2024-03-09 ENCOUNTER — Other Ambulatory Visit (HOSPITAL_COMMUNITY): Payer: Self-pay | Admitting: Urology

## 2024-03-09 DIAGNOSIS — Z8042 Family history of malignant neoplasm of prostate: Secondary | ICD-10-CM

## 2024-03-09 DIAGNOSIS — C7951 Secondary malignant neoplasm of bone: Secondary | ICD-10-CM

## 2024-03-10 DIAGNOSIS — R002 Palpitations: Secondary | ICD-10-CM

## 2024-03-11 ENCOUNTER — Ambulatory Visit: Payer: Self-pay | Admitting: Cardiology

## 2024-03-12 DIAGNOSIS — C61 Malignant neoplasm of prostate: Secondary | ICD-10-CM | POA: Diagnosis not present

## 2024-03-12 DIAGNOSIS — R338 Other retention of urine: Secondary | ICD-10-CM | POA: Diagnosis not present

## 2024-03-15 DIAGNOSIS — N130 Hydronephrosis with ureteropelvic junction obstruction: Secondary | ICD-10-CM | POA: Diagnosis not present

## 2024-03-16 NOTE — Telephone Encounter (Signed)
 Called patient advised of below they verbalized understanding.

## 2024-03-16 NOTE — Telephone Encounter (Signed)
-----   Message from Katlyn D [place] sent at 03/11/2024 12:57 PM EDT ----- Please let Mr. Shannahan know that his cardiac monitor showed that his predominant underlying rhythm was sinus rhythm, average heart rate 60 bpm.  He had short episodes of fast heartbeats that originated  from the top chambers of his heart, likely the cause for his irregular heart beat noted on his blood pressure cuff.  Given his history of low heart rates on higher doses of carvedilol  and not having  symptoms of increased palpitations would not recommend increasing at this time.  Recommend continuing with echocardiogram as planned, we will discuss further at follow-up. ----- Message ----- From: Jeffrie Oneil BROCKS, MD Sent: 03/10/2024   2:42 PM EDT To: Katlyn D [place], NP

## 2024-03-17 ENCOUNTER — Encounter (HOSPITAL_COMMUNITY)
Admission: RE | Admit: 2024-03-17 | Discharge: 2024-03-17 | Disposition: A | Discharge status: Home / Self Care | Source: Ambulatory Visit | Attending: Urology | Admitting: Urology

## 2024-03-17 DIAGNOSIS — Z8042 Family history of malignant neoplasm of prostate: Secondary | ICD-10-CM | POA: Insufficient documentation

## 2024-03-17 DIAGNOSIS — C7952 Secondary malignant neoplasm of bone marrow: Secondary | ICD-10-CM | POA: Diagnosis not present

## 2024-03-17 DIAGNOSIS — C7951 Secondary malignant neoplasm of bone: Secondary | ICD-10-CM | POA: Insufficient documentation

## 2024-03-17 DIAGNOSIS — C61 Malignant neoplasm of prostate: Secondary | ICD-10-CM | POA: Diagnosis not present

## 2024-03-17 MED ORDER — FLOTUFOLASTAT F 18 GALLIUM 296-5846 MBQ/ML IV SOLN
8.4000 | Freq: Once | INTRAVENOUS | Status: AC
  Administered 2024-03-17: 8.4 via INTRAVENOUS

## 2024-03-18 DIAGNOSIS — R338 Other retention of urine: Secondary | ICD-10-CM | POA: Diagnosis not present

## 2024-03-18 DIAGNOSIS — C61 Malignant neoplasm of prostate: Secondary | ICD-10-CM | POA: Diagnosis not present

## 2024-03-18 DIAGNOSIS — C7951 Secondary malignant neoplasm of bone: Secondary | ICD-10-CM | POA: Diagnosis not present

## 2024-03-18 DIAGNOSIS — N130 Hydronephrosis with ureteropelvic junction obstruction: Secondary | ICD-10-CM | POA: Diagnosis not present

## 2024-03-19 DIAGNOSIS — C7951 Secondary malignant neoplasm of bone: Secondary | ICD-10-CM | POA: Diagnosis not present

## 2024-03-19 DIAGNOSIS — C61 Malignant neoplasm of prostate: Secondary | ICD-10-CM | POA: Diagnosis not present

## 2024-03-19 DIAGNOSIS — R338 Other retention of urine: Secondary | ICD-10-CM | POA: Diagnosis not present

## 2024-03-19 DIAGNOSIS — N3000 Acute cystitis without hematuria: Secondary | ICD-10-CM | POA: Diagnosis not present

## 2024-03-21 ENCOUNTER — Other Ambulatory Visit: Payer: Self-pay

## 2024-03-21 ENCOUNTER — Emergency Department (HOSPITAL_COMMUNITY)

## 2024-03-21 ENCOUNTER — Encounter (HOSPITAL_COMMUNITY): Payer: Self-pay | Admitting: Internal Medicine

## 2024-03-21 ENCOUNTER — Inpatient Hospital Stay (HOSPITAL_COMMUNITY)
Admission: EM | Admit: 2024-03-21 | Discharge: 2024-03-25 | DRG: 698 | Disposition: A | Discharge status: Skilled Nursing Facility | Attending: Internal Medicine | Admitting: Internal Medicine

## 2024-03-21 DIAGNOSIS — Z809 Family history of malignant neoplasm, unspecified: Secondary | ICD-10-CM

## 2024-03-21 DIAGNOSIS — R7881 Bacteremia: Secondary | ICD-10-CM | POA: Diagnosis present

## 2024-03-21 DIAGNOSIS — C259 Malignant neoplasm of pancreas, unspecified: Secondary | ICD-10-CM | POA: Diagnosis not present

## 2024-03-21 DIAGNOSIS — D631 Anemia in chronic kidney disease: Secondary | ICD-10-CM | POA: Diagnosis present

## 2024-03-21 DIAGNOSIS — N400 Enlarged prostate without lower urinary tract symptoms: Secondary | ICD-10-CM | POA: Diagnosis not present

## 2024-03-21 DIAGNOSIS — C61 Malignant neoplasm of prostate: Secondary | ICD-10-CM | POA: Diagnosis present

## 2024-03-21 DIAGNOSIS — G9341 Metabolic encephalopathy: Secondary | ICD-10-CM | POA: Diagnosis not present

## 2024-03-21 DIAGNOSIS — I2510 Atherosclerotic heart disease of native coronary artery without angina pectoris: Secondary | ICD-10-CM | POA: Diagnosis not present

## 2024-03-21 DIAGNOSIS — Y846 Urinary catheterization as the cause of abnormal reaction of the patient, or of later complication, without mention of misadventure at the time of the procedure: Secondary | ICD-10-CM | POA: Diagnosis present

## 2024-03-21 DIAGNOSIS — E785 Hyperlipidemia, unspecified: Secondary | ICD-10-CM | POA: Diagnosis present

## 2024-03-21 DIAGNOSIS — R531 Weakness: Secondary | ICD-10-CM | POA: Diagnosis not present

## 2024-03-21 DIAGNOSIS — N1832 Chronic kidney disease, stage 3b: Secondary | ICD-10-CM | POA: Diagnosis not present

## 2024-03-21 DIAGNOSIS — E8721 Acute metabolic acidosis: Secondary | ICD-10-CM | POA: Diagnosis not present

## 2024-03-21 DIAGNOSIS — Z79899 Other long term (current) drug therapy: Secondary | ICD-10-CM

## 2024-03-21 DIAGNOSIS — G893 Neoplasm related pain (acute) (chronic): Secondary | ICD-10-CM | POA: Diagnosis present

## 2024-03-21 DIAGNOSIS — D649 Anemia, unspecified: Secondary | ICD-10-CM | POA: Diagnosis present

## 2024-03-21 DIAGNOSIS — M069 Rheumatoid arthritis, unspecified: Secondary | ICD-10-CM | POA: Diagnosis not present

## 2024-03-21 DIAGNOSIS — I130 Hypertensive heart and chronic kidney disease with heart failure and stage 1 through stage 4 chronic kidney disease, or unspecified chronic kidney disease: Secondary | ICD-10-CM | POA: Diagnosis present

## 2024-03-21 DIAGNOSIS — M5090 Cervical disc disorder, unspecified, unspecified cervical region: Secondary | ICD-10-CM | POA: Insufficient documentation

## 2024-03-21 DIAGNOSIS — B9620 Unspecified Escherichia coli [E. coli] as the cause of diseases classified elsewhere: Secondary | ICD-10-CM | POA: Diagnosis present

## 2024-03-21 DIAGNOSIS — N179 Acute kidney failure, unspecified: Secondary | ICD-10-CM | POA: Diagnosis not present

## 2024-03-21 DIAGNOSIS — Z7409 Other reduced mobility: Secondary | ICD-10-CM | POA: Diagnosis present

## 2024-03-21 DIAGNOSIS — Z7982 Long term (current) use of aspirin: Secondary | ICD-10-CM

## 2024-03-21 DIAGNOSIS — K5900 Constipation, unspecified: Secondary | ICD-10-CM | POA: Diagnosis not present

## 2024-03-21 DIAGNOSIS — I5022 Chronic systolic (congestive) heart failure: Secondary | ICD-10-CM | POA: Diagnosis not present

## 2024-03-21 DIAGNOSIS — R9431 Abnormal electrocardiogram [ECG] [EKG]: Secondary | ICD-10-CM | POA: Diagnosis not present

## 2024-03-21 DIAGNOSIS — I10 Essential (primary) hypertension: Secondary | ICD-10-CM | POA: Diagnosis not present

## 2024-03-21 DIAGNOSIS — T83511A Infection and inflammatory reaction due to indwelling urethral catheter, initial encounter: Secondary | ICD-10-CM | POA: Diagnosis not present

## 2024-03-21 DIAGNOSIS — I959 Hypotension, unspecified: Secondary | ICD-10-CM | POA: Diagnosis not present

## 2024-03-21 DIAGNOSIS — Z515 Encounter for palliative care: Principal | ICD-10-CM

## 2024-03-21 DIAGNOSIS — R7303 Prediabetes: Secondary | ICD-10-CM | POA: Diagnosis not present

## 2024-03-21 DIAGNOSIS — M1000 Idiopathic gout, unspecified site: Secondary | ICD-10-CM | POA: Diagnosis not present

## 2024-03-21 DIAGNOSIS — Z7189 Other specified counseling: Secondary | ICD-10-CM | POA: Diagnosis not present

## 2024-03-21 DIAGNOSIS — A419 Sepsis, unspecified organism: Secondary | ICD-10-CM

## 2024-03-21 DIAGNOSIS — Z79620 Long term (current) use of immunosuppressive biologic: Secondary | ICD-10-CM

## 2024-03-21 DIAGNOSIS — N390 Urinary tract infection, site not specified: Principal | ICD-10-CM | POA: Diagnosis present

## 2024-03-21 DIAGNOSIS — D630 Anemia in neoplastic disease: Secondary | ICD-10-CM | POA: Diagnosis present

## 2024-03-21 DIAGNOSIS — Z8546 Personal history of malignant neoplasm of prostate: Secondary | ICD-10-CM

## 2024-03-21 DIAGNOSIS — K4090 Unilateral inguinal hernia, without obstruction or gangrene, not specified as recurrent: Secondary | ICD-10-CM | POA: Diagnosis not present

## 2024-03-21 DIAGNOSIS — N1831 Chronic kidney disease, stage 3a: Secondary | ICD-10-CM | POA: Diagnosis present

## 2024-03-21 DIAGNOSIS — Z7961 Long term (current) use of immunomodulator: Secondary | ICD-10-CM | POA: Insufficient documentation

## 2024-03-21 DIAGNOSIS — M1990 Unspecified osteoarthritis, unspecified site: Secondary | ICD-10-CM | POA: Insufficient documentation

## 2024-03-21 DIAGNOSIS — I252 Old myocardial infarction: Secondary | ICD-10-CM

## 2024-03-21 DIAGNOSIS — E7800 Pure hypercholesterolemia, unspecified: Secondary | ICD-10-CM | POA: Insufficient documentation

## 2024-03-21 DIAGNOSIS — N1330 Unspecified hydronephrosis: Secondary | ICD-10-CM | POA: Diagnosis not present

## 2024-03-21 DIAGNOSIS — Z79631 Long term (current) use of antimetabolite agent: Secondary | ICD-10-CM

## 2024-03-21 DIAGNOSIS — R509 Fever, unspecified: Secondary | ICD-10-CM | POA: Diagnosis present

## 2024-03-21 DIAGNOSIS — Z87891 Personal history of nicotine dependence: Secondary | ICD-10-CM

## 2024-03-21 DIAGNOSIS — C7951 Secondary malignant neoplasm of bone: Secondary | ICD-10-CM | POA: Diagnosis not present

## 2024-03-21 DIAGNOSIS — E039 Hypothyroidism, unspecified: Secondary | ICD-10-CM | POA: Diagnosis not present

## 2024-03-21 DIAGNOSIS — K219 Gastro-esophageal reflux disease without esophagitis: Secondary | ICD-10-CM | POA: Diagnosis present

## 2024-03-21 DIAGNOSIS — N136 Pyonephrosis: Secondary | ICD-10-CM | POA: Diagnosis present

## 2024-03-21 DIAGNOSIS — Z79891 Long term (current) use of opiate analgesic: Secondary | ICD-10-CM

## 2024-03-21 DIAGNOSIS — Z79890 Hormone replacement therapy (postmenopausal): Secondary | ICD-10-CM

## 2024-03-21 DIAGNOSIS — R0989 Other specified symptoms and signs involving the circulatory and respiratory systems: Secondary | ICD-10-CM | POA: Diagnosis not present

## 2024-03-21 DIAGNOSIS — F341 Dysthymic disorder: Secondary | ICD-10-CM | POA: Insufficient documentation

## 2024-03-21 DIAGNOSIS — G9340 Encephalopathy, unspecified: Secondary | ICD-10-CM | POA: Diagnosis not present

## 2024-03-21 DIAGNOSIS — R4182 Altered mental status, unspecified: Secondary | ICD-10-CM | POA: Diagnosis not present

## 2024-03-21 LAB — CBC WITH DIFFERENTIAL/PLATELET
Abs Immature Granulocytes: 0.11 K/uL — ABNORMAL HIGH (ref 0.00–0.07)
Basophils Absolute: 0 K/uL (ref 0.0–0.1)
Basophils Relative: 0 %
Eosinophils Absolute: 0.1 K/uL (ref 0.0–0.5)
Eosinophils Relative: 1 %
HCT: 24 % — ABNORMAL LOW (ref 39.0–52.0)
Hemoglobin: 7.7 g/dL — ABNORMAL LOW (ref 13.0–17.0)
Immature Granulocytes: 1 %
Lymphocytes Relative: 4 %
Lymphs Abs: 0.4 K/uL — ABNORMAL LOW (ref 0.7–4.0)
MCH: 29.3 pg (ref 26.0–34.0)
MCHC: 32.1 g/dL (ref 30.0–36.0)
MCV: 91.3 fL (ref 80.0–100.0)
Monocytes Absolute: 0.5 K/uL (ref 0.1–1.0)
Monocytes Relative: 5 %
Neutro Abs: 7.8 K/uL — ABNORMAL HIGH (ref 1.7–7.7)
Neutrophils Relative %: 89 %
Platelets: 102 K/uL — ABNORMAL LOW (ref 150–400)
RBC: 2.63 MIL/uL — ABNORMAL LOW (ref 4.22–5.81)
RDW: 16.6 % — ABNORMAL HIGH (ref 11.5–15.5)
WBC: 8.8 K/uL (ref 4.0–10.5)
nRBC: 0 % (ref 0.0–0.2)

## 2024-03-21 LAB — COMPREHENSIVE METABOLIC PANEL WITH GFR
ALT: 26 U/L (ref 0–44)
AST: 51 U/L — ABNORMAL HIGH (ref 15–41)
Albumin: 2.7 g/dL — ABNORMAL LOW (ref 3.5–5.0)
Alkaline Phosphatase: 168 U/L — ABNORMAL HIGH (ref 38–126)
Anion gap: 8 (ref 5–15)
BUN: 38 mg/dL — ABNORMAL HIGH (ref 8–23)
CO2: 18 mmol/L — ABNORMAL LOW (ref 22–32)
Calcium: 7.5 mg/dL — ABNORMAL LOW (ref 8.9–10.3)
Chloride: 104 mmol/L (ref 98–111)
Creatinine, Ser: 2.69 mg/dL — ABNORMAL HIGH (ref 0.61–1.24)
GFR, Estimated: 22 mL/min — ABNORMAL LOW (ref >60)
Glucose, Bld: 101 mg/dL — ABNORMAL HIGH (ref 70–99)
Potassium: 3.5 mmol/L (ref 3.5–5.1)
Sodium: 130 mmol/L — ABNORMAL LOW (ref 135–145)
Total Bilirubin: 1 mg/dL (ref 0.0–1.2)
Total Protein: 5.6 g/dL — ABNORMAL LOW (ref 6.5–8.1)

## 2024-03-21 LAB — URINALYSIS, W/ REFLEX TO CULTURE (INFECTION SUSPECTED)
Bilirubin Urine: NEGATIVE
Glucose, UA: NEGATIVE mg/dL
Ketones, ur: NEGATIVE mg/dL
Nitrite: NEGATIVE
Protein, ur: 100 mg/dL — AB
Specific Gravity, Urine: 1.014 (ref 1.005–1.030)
WBC, UA: >50 WBC/hpf (ref 0–5)
pH: 5 (ref 5.0–8.0)

## 2024-03-21 LAB — POC OCCULT BLOOD, ED: Fecal Occult Bld: NEGATIVE

## 2024-03-21 LAB — I-STAT CG4 LACTIC ACID, ED
Lactic Acid, Venous: 1 mmol/L (ref 0.5–1.9)
Lactic Acid, Venous: 1.2 mmol/L (ref 0.5–1.9)

## 2024-03-21 LAB — PROTIME-INR
INR: 1.3 — ABNORMAL HIGH (ref 0.8–1.2)
Prothrombin Time: 16.4 s — ABNORMAL HIGH (ref 11.4–15.2)

## 2024-03-21 MED ORDER — ONDANSETRON HCL 4 MG/2ML IJ SOLN
4.0000 mg | Freq: Four times a day (QID) | INTRAMUSCULAR | Status: DC | PRN
  Administered 2024-03-22 – 2024-03-23 (×4): 4 mg via INTRAVENOUS
  Filled 2024-03-21 (×2): qty 2

## 2024-03-21 MED ORDER — FOLIC ACID 1 MG PO TABS
2.0000 mg | ORAL_TABLET | Freq: Two times a day (BID) | ORAL | Status: DC
  Administered 2024-03-21 – 2024-03-25 (×15): 2 mg via ORAL
  Filled 2024-03-21 (×9): qty 2

## 2024-03-21 MED ORDER — LACTATED RINGERS IV SOLN: INTRAVENOUS | Status: AC

## 2024-03-21 MED ORDER — PIPERACILLIN-TAZOBACTAM IN DEX 2-0.25 GM/50ML IV SOLN
2.2500 g | Freq: Three times a day (TID) | INTRAVENOUS | Status: DC
  Filled 2024-03-21: qty 50

## 2024-03-21 MED ORDER — LEVOTHYROXINE SODIUM 75 MCG PO TABS
75.0000 ug | ORAL_TABLET | Freq: Every day | ORAL | Status: DC
  Administered 2024-03-22 – 2024-03-23 (×4): 75 ug via ORAL
  Filled 2024-03-21 (×2): qty 1

## 2024-03-21 MED ORDER — FERROUS SULFATE 325 (65 FE) MG PO TABS
325.0000 mg | ORAL_TABLET | Freq: Every day | ORAL | Status: DC
  Administered 2024-03-21 – 2024-03-25 (×8): 325 mg via ORAL
  Filled 2024-03-21 (×5): qty 1

## 2024-03-21 MED ORDER — LEFLUNOMIDE 10 MG PO TABS
20.0000 mg | ORAL_TABLET | Freq: Every day | ORAL | Status: DC
  Administered 2024-03-21 – 2024-03-25 (×8): 20 mg via ORAL
  Filled 2024-03-21 (×5): qty 2

## 2024-03-21 MED ORDER — TRAMADOL HCL 50 MG PO TABS
50.0000 mg | ORAL_TABLET | Freq: Two times a day (BID) | ORAL | Status: DC | PRN
  Administered 2024-03-21 – 2024-03-23 (×7): 50 mg via ORAL
  Filled 2024-03-21 (×4): qty 1

## 2024-03-21 MED ORDER — ONDANSETRON HCL 4 MG PO TABS: 4.0000 mg | ORAL_TABLET | Freq: Four times a day (QID) | ORAL | Status: DC | PRN

## 2024-03-21 MED ORDER — ALLOPURINOL 100 MG PO TABS
100.0000 mg | ORAL_TABLET | Freq: Every evening | ORAL | Status: DC
  Administered 2024-03-21 – 2024-03-24 (×7): 100 mg via ORAL
  Filled 2024-03-21 (×4): qty 1

## 2024-03-21 MED ORDER — OXYBUTYNIN CHLORIDE 5 MG PO TABS: 5.0000 mg | ORAL_TABLET | Freq: Three times a day (TID) | ORAL | Status: DC | PRN

## 2024-03-21 MED ORDER — LACTATED RINGERS IV SOLN: INTRAVENOUS | Status: DC

## 2024-03-21 MED ORDER — PIPERACILLIN-TAZOBACTAM 3.375 G IVPB
3.3750 g | Freq: Two times a day (BID) | INTRAVENOUS | Status: DC
  Administered 2024-03-21: 3.375 g via INTRAVENOUS
  Filled 2024-03-21: qty 50

## 2024-03-21 MED ORDER — ATORVASTATIN CALCIUM 40 MG PO TABS
80.0000 mg | ORAL_TABLET | Freq: Every evening | ORAL | Status: DC
  Administered 2024-03-21 – 2024-03-24 (×7): 80 mg via ORAL
  Filled 2024-03-21 (×4): qty 2

## 2024-03-21 MED ORDER — LACTATED RINGERS IV BOLUS (SEPSIS)
500.0000 mL | Freq: Once | INTRAVENOUS | Status: AC
  Administered 2024-03-21: 500 mL via INTRAVENOUS

## 2024-03-21 MED ORDER — FAMOTIDINE 20 MG PO TABS: 20.0000 mg | ORAL_TABLET | Freq: Two times a day (BID) | ORAL | Status: DC

## 2024-03-21 MED ORDER — PIPERACILLIN-TAZOBACTAM 3.375 G IVPB 30 MIN
3.3750 g | Freq: Once | INTRAVENOUS | Status: AC
  Administered 2024-03-21: 3.375 g via INTRAVENOUS
  Filled 2024-03-21: qty 50

## 2024-03-21 MED ORDER — PANTOPRAZOLE SODIUM 40 MG PO TBEC
40.0000 mg | DELAYED_RELEASE_TABLET | Freq: Every day | ORAL | Status: DC
  Administered 2024-03-21 – 2024-03-25 (×8): 40 mg via ORAL
  Filled 2024-03-21 (×5): qty 1

## 2024-03-21 MED ORDER — ACETAMINOPHEN 650 MG RE SUPP: 650.0000 mg | Freq: Four times a day (QID) | RECTAL | Status: DC | PRN

## 2024-03-21 MED ORDER — FAMOTIDINE 20 MG PO TABS
20.0000 mg | ORAL_TABLET | Freq: Every day | ORAL | Status: DC
  Administered 2024-03-21 – 2024-03-24 (×7): 20 mg via ORAL
  Filled 2024-03-21 (×4): qty 1

## 2024-03-21 MED ORDER — ENSURE PLUS HIGH PROTEIN PO LIQD
237.0000 mL | Freq: Two times a day (BID) | ORAL | Status: DC
  Administered 2024-03-21 – 2024-03-25 (×8): 237 mL via ORAL

## 2024-03-21 MED ORDER — ASPIRIN 81 MG PO CHEW
81.0000 mg | CHEWABLE_TABLET | Freq: Every evening | ORAL | Status: DC
  Administered 2024-03-21 – 2024-03-24 (×7): 81 mg via ORAL
  Filled 2024-03-21 (×4): qty 1

## 2024-03-21 MED ORDER — ACETAMINOPHEN 325 MG PO TABS
325.0000 mg | ORAL_TABLET | Freq: Once | ORAL | Status: AC
  Administered 2024-03-21: 325 mg via ORAL
  Filled 2024-03-21: qty 1

## 2024-03-21 MED ORDER — ACETAMINOPHEN 325 MG PO TABS
650.0000 mg | ORAL_TABLET | Freq: Four times a day (QID) | ORAL | Status: DC | PRN
  Administered 2024-03-21 (×2): 650 mg via ORAL
  Filled 2024-03-21 (×3): qty 2

## 2024-03-21 MED ORDER — OMEGA-3-ACID ETHYL ESTERS 1 G PO CAPS
1.0000 g | ORAL_CAPSULE | Freq: Two times a day (BID) | ORAL | Status: DC
  Administered 2024-03-21 – 2024-03-25 (×15): 1 g via ORAL
  Filled 2024-03-21 (×9): qty 1

## 2024-03-21 NOTE — ED Provider Notes (Signed)
 [place] EMERGENCY DEPARTMENT AT [facility] Provider Note   CSN: [id number] Arrival date & time: 03/21/24  9173     Patient presents with: Weakness   Connor Hansen is a 89 y.o. male.   Pt is a 89y/o male with hx of CAD with PCI to LAD/RCA in 2010, history of remote MI in 1992, hypertension, hyperlipidemia, sinus bradycardia, RA, right chronic superficial DVT not on anticoagulation per vascular surgery, with urinary retention in late July with foley catheter with recent initiation for injections for prostate CA is presenting today due to persistent fever, poor appetite and generalized weakness.  Patient is unsure exactly how long he had been having the fever but stated that he was diagnosed with a urinary tract infection and started on an antibiotic 2 days ago.  He reports however he has continued to have fever and symptoms were not improving and his son thought he needed to come to the hospital.  He is unsure what antibiotic he is taking and is not available in the notes.  EMS reported patient was hypotensive 98/44 upon their arrival which did improve with 500 of IV saline.  Patient denies any abdominal pain, cough, shortness of breath, nausea or vomiting.  Reports his last fever was this morning and he took Tylenol  around 5 AM. Patient's son has arrived and reports that all of that is correct.  Patient started running a fever on Friday and they went to the urology office and he was started on Bactrim as they were waiting for a culture.  However he has had a fever every day since then and yesterday he was very confused.  He does live alone and they went over and found him on the floor.  He thinks he got on the floor because the heating pad was too warm and he was going to unplug it but then could not get up.  However it is unclear if he hit his head or fell.  They report that he has had some nausea but he has not had any vomiting and last fever was this morning of 102.  Patient still  takes methotrexate and Enbrel.  The injections he was getting were antitestosterone shots and he is not on any chemotherapy at this time.  The history is provided by medical records, the patient and the EMS personnel.  Weakness      Prior to Admission medications   Medication Sig Start Date End Date Taking? Authorizing Provider  allopurinol  (ZYLOPRIM ) 100 MG tablet Take 100 mg by mouth every evening.   Yes [provider]  amLODipine-valsartan (EXFORGE) 10-160 MG tablet Take 1 tablet by mouth daily. 11/26/23  Yes [provider]  aspirin  81 MG tablet Take 81 mg by mouth every evening.   Yes [provider]  atorvastatin  (LIPITOR) 80 MG tablet Take 1 tablet (80 mg total) by mouth daily. Patient taking differently: Take 80 mg by mouth every evening. 05/20/23  Yes Conte, Tessa N, PA-C  calcium -vitamin D (OSCAL-500) 500-400 MG-UNIT tablet Take 1 tablet by mouth daily.   Yes [provider]  carvedilol  (COREG ) 3.125 MG tablet Take 1 tablet (3.125 mg total) by mouth 2 (two) times daily. 12/29/23  Yes Conte, Tessa N, PA-C  doxazosin (CARDURA) 1 MG tablet Take 2 mg by mouth at bedtime. 11/26/23  Yes [provider]  etanercept (ENBREL SURECLICK) 50 MG/ML injection Inject 50 mg into the skin once a week.   Yes [provider]  famotidine  (PEPCID )  20 MG tablet Take 20 mg by mouth 2 (two) times daily.   Yes [provider]  Ferrous Sulfate  (IRON) 325 (65 Fe) MG TABS Take 1 tablet by mouth daily.   Yes [provider]  folic acid  (FOLVITE ) 1 MG tablet Take 2 mg by mouth 2 (two) times daily.   Yes [provider]  furosemide  (LASIX ) 20 MG tablet Take 1 tablet (20 mg total) by mouth daily as needed for fluid. 05/20/23 03/21/24 Yes Fountain, Madison L, NP  leflunomide  (ARAVA ) 20 MG tablet Take 1 tablet by mouth daily. 01/06/18  Yes [provider]  levothyroxine  (SYNTHROID , LEVOTHROID) 75 MCG tablet Take 75 mcg by mouth daily  before breakfast.   Yes [provider]  methotrexate (RHEUMATREX) 2.5 MG tablet Take 12.5 mg by mouth once a week. Caution:Chemotherapy. Protect from light.   Yes [provider]  Multiple Vitamins-Minerals (PRESERVISION/LUTEIN PO) Take 500 mg by mouth in the morning and at bedtime.   Yes [provider]  Omega-3 Fatty Acids (FISH OIL PO) Take 1,000 capsules by mouth in the morning and at bedtime.   Yes [provider]  omeprazole (PRILOSEC) 20 MG capsule Take 20 mg by mouth daily.   Yes [provider]  oxybutynin  (DITROPAN ) 5 MG tablet Take 5 mg by mouth every 8 (eight) hours as needed. 03/17/24  Yes [provider]  sulfamethoxazole-trimethoprim (BACTRIM DS) 800-160 MG tablet Take 1 tablet by mouth 2 (two) times daily. 03/19/24  Yes [provider]  traMADol  (ULTRAM ) 50 MG tablet Take 50 mg by mouth as needed.   Yes [provider]  finasteride (PROSCAR) 5 MG tablet Take 5 mg by mouth daily. Patient not taking: Reported on 03/21/2024 05/30/23   [provider]    Allergies: Patient has no known allergies.    Review of Systems  Neurological:  Positive for weakness.    Updated Vital Signs BP (!) 105/53 (BP Location: Right Arm)   Pulse 65   Temp 97.7 F (36.5 C) (Oral)   Resp 18   Ht 5' 9 (1.753 m)   Wt 79.8 kg   SpO2 93%   BMI 25.99 kg/m   Physical Exam Vitals and nursing note reviewed.  Constitutional:      General: He is not in acute distress.    Appearance: He is well-developed.  HENT:     Head: Normocephalic and atraumatic.     Mouth/Throat:     Mouth: Mucous membranes are dry.  Eyes:     Conjunctiva/sclera: Conjunctivae normal.     Pupils: Pupils are equal, round, and reactive to light.  Cardiovascular:     Rate and Rhythm: Normal rate and regular rhythm.     Heart sounds: No murmur heard. Pulmonary:     Effort: Pulmonary effort is normal. No respiratory distress.     Breath sounds:  Normal breath sounds. No wheezing or rales.  Abdominal:     General: There is no distension.     Palpations: Abdomen is soft.     Tenderness: There is no abdominal tenderness. There is no guarding or rebound.     Comments: 2 injection sites on the upper abdomen with some mild surrounding erythema, warmth and mild tenderness.  No suprapubic tenderness  Genitourinary:    Comments: Foley catheter in place draining yellow urine Musculoskeletal:        General: No tenderness. Normal range of motion.     Cervical back: Normal range of motion and neck  supple.  Skin:    General: Skin is warm and dry.     Coloration: Skin is pale.     Findings: No erythema or rash.  Neurological:     Mental Status: He is alert.     Sensory: No sensory deficit.     Motor: No weakness.     Comments: Oriented to person and place but a little confused about the day.  Patient states that he is confused.  Psychiatric:        Behavior: Behavior normal.     (all labs ordered are listed, but only abnormal results are displayed) Labs Reviewed  COMPREHENSIVE METABOLIC PANEL WITH GFR - Abnormal; Notable for the following components:      Result Value   Sodium 130 (*)    CO2 18 (*)    Glucose, Bld 101 (*)    BUN 38 (*)    Creatinine, Ser 2.69 (*)    Calcium  7.5 (*)    Total Protein 5.6 (*)    Albumin 2.7 (*)    AST 51 (*)    Alkaline Phosphatase 168 (*)    GFR, Estimated 22 (*)    All other components within normal limits  CBC WITH DIFFERENTIAL/PLATELET - Abnormal; Notable for the following components:   RBC 2.63 (*)    Hemoglobin 7.7 (*)    HCT 24.0 (*)    RDW 16.6 (*)    Platelets 102 (*)    Neutro Abs 7.8 (*)    Lymphs Abs 0.4 (*)    Abs Immature Granulocytes 0.11 (*)    All other components within normal limits  PROTIME-INR - Abnormal; Notable for the following components:   Prothrombin Time 16.4 (*)    INR 1.3 (*)    All other components within normal limits  URINALYSIS, W/ REFLEX TO CULTURE  (INFECTION SUSPECTED) - Abnormal; Notable for the following components:   APPearance CLOUDY (*)    Hgb urine dipstick SMALL (*)    Protein, ur 100 (*)    Leukocytes,Ua LARGE (*)    Bacteria, UA RARE (*)    Non Squamous Epithelial 0-5 (*)    All other components within normal limits  CULTURE, BLOOD (ROUTINE X 2)  CULTURE, BLOOD (ROUTINE X 2)  URINE CULTURE  I-STAT CG4 LACTIC ACID, ED  POC OCCULT BLOOD, ED  I-STAT CG4 LACTIC ACID, ED    EKG: EKG Interpretation Date/Time:  Sunday March 21 2024 09:38:05 EDT Ventricular Rate:  64 PR Interval:  180 QRS Duration:  106 QT Interval:  446 QTC Calculation: 461 R Axis:   -22  Text Interpretation: Sinus rhythm Inferior infarct, old Anterior infarct, old No significant change since last tracing Confirmed by Doretha Folks ([id number]) on 03/21/2024 9:38:57 AM  Radiology: CT ABDOMEN PELVIS WO CONTRAST Result Date: 03/21/2024 CLINICAL DATA:  Urosepsis. Metastatic prostate carcinoma. * Tracking Code: BO * EXAM: CT ABDOMEN AND PELVIS WITHOUT CONTRAST TECHNIQUE: Multidetector CT imaging of the abdomen and pelvis was performed following the standard protocol without IV contrast. RADIATION DOSE REDUCTION: This exam was performed according to the departmental dose-optimization program which includes automated exposure control, adjustment of the mA and/or kV according to patient size and/or use of iterative reconstruction technique. COMPARISON:  03/02/2024 FINDINGS: Lower chest: New tiny left pleural effusion fall. Hepatobiliary: No mass visualized on this unenhanced exam. Prior cholecystectomy. No evidence of biliary obstruction. Pancreas: No mass or inflammatory process visualized on this unenhanced exam. Spleen:  Within normal limits in size. Adrenals/Urinary tract: Moderate bilateral  hydroureteronephrosis is stable, and no ureteral calculi identified. Foley catheter is seen within the bladder which is decompressed. Stomach/Bowel: No evidence of obstruction,  inflammatory process, or abnormal fluid collections. Small left inguinal hernia again seen containing a loop of sigmoid colon. Vascular/Lymphatic: No pathologically enlarged lymph nodes identified. No evidence of abdominal aortic aneurysm. Reproductive:  Stable mildly enlarged prostate. Other:  None. Musculoskeletal: Diffuse sclerotic bone metastases are again seen, without significant change. IMPRESSION: Stable moderate bilateral hydroureteronephrosis. No ureteral calculi or other obstructing etiology apparent by CT. Stable mildly enlarged prostate. Stable diffuse sclerotic bone metastases. New tiny left pleural effusion. Stable small left inguinal hernia. Electronically Signed   By: Norleen DELENA Kil M.D.   On: 03/21/2024 11:01   CT Head Wo Contrast Result Date: 03/21/2024 CLINICAL DATA:  Recent fall.  Confusion.  Altered mental status. EXAM: CT HEAD WITHOUT CONTRAST TECHNIQUE: Contiguous axial images were obtained from the base of the skull through the vertex without intravenous contrast. RADIATION DOSE REDUCTION: This exam was performed according to the departmental dose-optimization program which includes automated exposure control, adjustment of the mA and/or kV according to patient size and/or use of iterative reconstruction technique. COMPARISON:  None Available. FINDINGS: Brain: No evidence of intracranial hemorrhage, acute infarction, hydrocephalus, extra-axial collection, or mass lesion/mass effect. Mild-to-moderate diffuse cerebral atrophy and chronic small vessel disease noted. Vascular:  No hyperdense vessel or other acute findings. Skull: No evidence of fracture or other significant bone abnormality. Sinuses/Orbits:  No acute findings. Other: None. IMPRESSION: No acute intracranial abnormality. Cerebral atrophy and chronic small vessel disease. Electronically Signed   By: Norleen DELENA Kil M.D.   On: 03/21/2024 10:49   DG Chest Port 1 View Result Date: 03/21/2024 EXAM: 1 VIEW XRAY OF THE CHEST 03/21/2024  09:12:59 AM COMPARISON: 2 view chest x-ray 06/17/2018 and CT of the chest 03/02/2024. CLINICAL HISTORY: Questionable sepsis - evaluate for abnormality. Weakness after being diagnosed with UTI two days ago, taking antibiotics for treatment. Diagnosed with pancreatic cancer recently. FINDINGS: LUNGS AND PLEURA: Scarring at the left base is stable. Lung volumes are low. No focal pulmonary opacity. No pulmonary edema. No pleural effusion. No pneumothorax. HEART AND MEDIASTINUM: No acute abnormality of the cardiac and mediastinal silhouettes. BONES AND SOFT TISSUES: No acute osseous abnormality. IMPRESSION: 1. No acute cardiopulmonary abnormality. Electronically signed by: Lonni Necessary MD 03/21/2024 09:26 AM EDT RP Workstation: [id number]     Procedures   Medications Ordered in the ED  lactated ringers  infusion ( Intravenous New Bag/Given 03/21/24 1133)  piperacillin -tazobactam (ZOSYN ) IVPB 2.25 g (has no administration in time range)  lactated ringers  bolus 500 mL (0 mLs Intravenous Stopped 03/21/24 0951)  piperacillin -tazobactam (ZOSYN ) IVPB 3.375 g (0 g Intravenous Stopped 03/21/24 1123)                                    Medical Decision Making Amount and/or Complexity of Data Reviewed External Data Reviewed: notes. Labs: ordered. Decision-making details documented in ED Course. Radiology: ordered and independent interpretation performed. Decision-making details documented in ED Course. ECG/medicine tests: ordered and independent interpretation performed. Decision-making details documented in ED Course.  Risk Prescription drug management. Decision regarding hospitalization.   Pt with multiple medical problems and comorbidities and presenting today with a complaint that caries a high risk for morbidity and mortality.  Here today with symptoms concerning for possible sepsis with report of fever and recent initiation of antibiotics.  Patient has no  symptoms to suggest pneumonia and given  his Foley catheter placement several weeks ago concerned that urine would be his primary source as he has no abdominal pain, rashes and low suspicion for meningitis at this time.  Patient's blood pressure initially was 102/60 and then dropped to 99/49.  He was given additional 500 mL LR bolus making a total of 1 L and started on a rate.  Undifferentiated sepsis order set started. I independently interpreted patient's labs and EKG.  EKG without acute findings.  CBC with worsening anemia today with a hemoglobin of 7.7 from 9 earlier this year.  Speaking with the patient's son he reports that they have been following the hemoglobin and has been gradually dropping and they are not sure where it is coming from.  He does not take any anticoagulation.  He has never required a blood transfusion.  UA today with large leukocytes, white cells and rare bacteria as well as CMP today showing a new AKI with creatinine of 2.69 from 1.893 weeks ago but prior to that a creatinine of 1 with hyponatremia of 130 most likely related to dehydration and poor oral intake.  Lactic acid is normal.  I have independently visualized and interpreted pt's images today. Chest x-ray without acute findings.  Head CT due to the patient's reported fall and abdomen pelvis imaging due to reported history is pending.  Patient covered with Zosyn  due to concern for catheter associated UTI in the setting of being immune compromised with persistent fever.  Head CT is negative for any acute injury, Hemoccult is negative and abdominal CT does not show anything new.  He has stable moderate bilateral hydronephrosis but no evidence of calculi or obstructing etiology at this time.  Discussed with patient's family.  Discussed with the hospitalist for admission.  They are comfortable with this plan      Final diagnoses:  Urinary tract infection associated with indwelling urethral catheter, initial encounter (HCC)  Sepsis, due to unspecified organism,  unspecified whether acute organ dysfunction present (HCC)  Anemia, unspecified type    ED Discharge Orders     None          Doretha Folks, MD 03/21/24 1224

## 2024-03-21 NOTE — ED Triage Notes (Addendum)
 BIB [facility] for weakness after being dx with UTI two days ago, taking antibiotics for tx. Dx with pancreatic cancer recently. VS w/ EMS HR 70 Spo2 95 BP 98/44 CBG 123. Given 500cc NS PTA.

## 2024-03-21 NOTE — H&P (Addendum)
 History and Physical    Patient: Connor Hansen FMW:[id number] DOB: 1934/06/02 DOA: 03/21/2024 DOS: the patient was seen and examined on 03/21/2024 PCP: Ransom Other, MD  Patient coming from: Home  Chief Complaint:  Chief Complaint  Patient presents with   Weakness   HPI: Connor Hansen is a 89 y.o. male with medical history significant of CAD, NSTEMI in 1992, CKD, GERD, hypertension, hyperlipidemia, prediabetes, gout, hypothyroidism, cervical disc disease, osteoarthritis, rheumatoid arthritis on Enbrel and weekly methotrexate, stage IIIa CKD who presented to the emergency department who was recently diagnosed with an UTI and prostate cancer with generalized weakness, fever and decreased appetite despite being on antibiotic for the past 2 days with Bactrim prescribed by urology.  He lives by himself and was found on the floor by his son.  He seems to be confused and unable to provide a lot of details about his HPI.  Lab work: Urinalysis was cloudy with small hemoglobin, large leukocyte esterase, protein of 100 mg/dL, 3-89 RBC, greater than 50 WBC, rare bacteria and positive WBC clumps.  Fecal occult blood was negative.  CBC showed a white count of 8.8 with 89% neutrophils, hemoglobin 7.7 g/dL and platelets 897.  PT was 16.4 and INR 1.3.  CMP showed a sodium 130, potassium 3.5, chloride 104 and CO2 18 mmol/L with a normal anion gap.  Glucose 101, BUN 30 and creatinine 2.69 mg/dL (2 weeks and 5 days ago creatinine was 1.89 mg/dL) lactic acid x 2 normal.  Imaging: Portable 1 view chest radiograph with no acute cardiopulmonary abnormality.  CT head without contrast no acute intracranial abnormality.  There was cerebral atrophy with chronic small vessel disease.  CT abdomen/pelvis without contrast showing stable moderate bilateral hydroureteronephrosis.  No ureteral calculi or other obstructing etiology apparent by CT.  Stable mildly enlarged prostate.  Stable diffuse sclerotic bone metastasis.  New  small left pleural effusion.  Stable small left inguinal hernia.   ED course: Initial vital signs were temperature 97.8 F, pulse 69, respiration 15, BP 99/49 mmHg O2 sat 94% on room air.  The patient received LR 500 mL bolus and Zosyn  3.375 g IVPB.  Review of Systems: As mentioned in the history of present illness. All other systems reviewed and are negative. Past Medical History:  Diagnosis Date   CAD (coronary artery disease)    2/10 - BMS to mid LAD, RCA. Anteroapical akinesis - normal EF. 2010   CKD (chronic kidney disease)    Coronary atherosclerosis of native coronary artery    GERD (gastroesophageal reflux disease)    HTN (hypertension)    Hyperlipidemia    Hypothyroidism    Old myocardial infarct    RA (rheumatoid arthritis) (HCC)    Past Surgical History:  Procedure Laterality Date   CHOLECYSTECTOMY     HEMILAMINOTOMY LUMBAR SPINE     Social History:  reports that he has quit smoking. His smoking use included cigarettes. He has never used smokeless tobacco. He reports that he does not currently use alcohol. He reports that he does not use drugs.  No Known Allergies  Family History  Problem Relation Age of Onset   Cancer Father     Prior to Admission medications   Medication Sig Start Date End Date Taking? Authorizing Provider  allopurinol  (ZYLOPRIM ) 100 MG tablet Take 100 mg by mouth every evening.   Yes [provider]  amLODipine-valsartan (EXFORGE) 10-160 MG tablet Take 1 tablet by mouth daily. 11/26/23  Yes [provider]  aspirin   81 MG tablet Take 81 mg by mouth every evening.   Yes [provider]  atorvastatin  (LIPITOR) 80 MG tablet Take 1 tablet (80 mg total) by mouth daily. Patient taking differently: Take 80 mg by mouth every evening. 05/20/23  Yes Conte, Tessa N, PA-C  calcium -vitamin D (OSCAL-500) 500-400 MG-UNIT tablet Take 1 tablet by mouth daily.   Yes [provider]  carvedilol  (COREG ) 3.125 MG tablet Take 1 tablet  (3.125 mg total) by mouth 2 (two) times daily. 12/29/23  Yes Conte, Tessa N, PA-C  doxazosin (CARDURA) 1 MG tablet Take 2 mg by mouth at bedtime. 11/26/23  Yes [provider]  etanercept (ENBREL SURECLICK) 50 MG/ML injection Inject 50 mg into the skin once a week.   Yes [provider]  famotidine  (PEPCID ) 20 MG tablet Take 20 mg by mouth 2 (two) times daily.   Yes [provider]  Ferrous Sulfate  (IRON) 325 (65 Fe) MG TABS Take 1 tablet by mouth daily.   Yes [provider]  folic acid  (FOLVITE ) 1 MG tablet Take 2 mg by mouth 2 (two) times daily.   Yes [provider]  furosemide  (LASIX ) 20 MG tablet Take 1 tablet (20 mg total) by mouth daily as needed for fluid. 05/20/23 03/21/24 Yes Fountain, Madison L, NP  leflunomide  (ARAVA ) 20 MG tablet Take 1 tablet by mouth daily. 01/06/18  Yes [provider]  levothyroxine  (SYNTHROID , LEVOTHROID) 75 MCG tablet Take 75 mcg by mouth daily before breakfast.   Yes [provider]  methotrexate (RHEUMATREX) 2.5 MG tablet Take 12.5 mg by mouth once a week. Caution:Chemotherapy. Protect from light.   Yes [provider]  Multiple Vitamins-Minerals (PRESERVISION/LUTEIN PO) Take 500 mg by mouth in the morning and at bedtime.   Yes [provider]  Omega-3 Fatty Acids (FISH OIL PO) Take 1,000 capsules by mouth in the morning and at bedtime.   Yes [provider]  omeprazole (PRILOSEC) 20 MG capsule Take 20 mg by mouth daily.   Yes [provider]  oxybutynin  (DITROPAN ) 5 MG tablet Take 5 mg by mouth every 8 (eight) hours as needed. 03/17/24  Yes [provider]  sulfamethoxazole-trimethoprim (BACTRIM DS) 800-160 MG tablet Take 1 tablet by mouth 2 (two) times daily. 03/19/24  Yes [provider]  traMADol  (ULTRAM ) 50 MG tablet Take 50 mg by mouth as needed.   Yes [provider]  finasteride (PROSCAR) 5 MG tablet Take 5 mg by mouth daily. Patient not  taking: Reported on 03/21/2024 05/30/23   [provider]    Physical Exam: Vitals:   03/21/24 0836 03/21/24 0841 03/21/24 0852 03/21/24 0945  BP: (!) 99/49   (!) 105/53  Pulse: 69   65  Resp:  15  18  Temp:    97.7 F (36.5 C)  TempSrc:    Oral  SpO2: 94%  94% 93%  Weight:      Height:       Physical Exam Vitals and nursing note reviewed.  Constitutional:      Appearance: He is ill-appearing.  HENT:     Head: Normocephalic.     Nose: No rhinorrhea.     Mouth/Throat:     Mouth: Mucous membranes are moist.  Eyes:     General: No scleral icterus.    Pupils: Pupils are equal, round, and reactive to light.  Cardiovascular:     Rate and Rhythm: Normal rate and regular rhythm.  Abdominal:  General: Bowel sounds are normal.     Palpations: Abdomen is soft.  Musculoskeletal:     Cervical back: Neck supple.     Right lower leg: No edema.     Left lower leg: No edema.  Skin:    General: Skin is warm and dry.  Neurological:     General: No focal deficit present.     Mental Status: He is alert. He is disoriented.  Psychiatric:        Mood and Affect: Mood normal.     Data Reviewed:  Results are pending, will review when available. 04/26/2021 echocardiogram report. IMPRESSIONS:   1. The left ventricle has mild-moderately reduced systolic function, with an ejection fraction of 40-45%. The cavity size was normal. There is mildly increased left ventricular wall thickness. Left ventricular diastolic Doppler parameters are consistent  with impaired relaxation.  2. The right ventricle has normal systolic function. The cavity was normal.  3. The tricuspid valve is grossly normal.  4. The aortic valve is tricuspid. Mild thickening of the aortic valve. No stenosis of the aortic valve.  5. The aorta is normal unless otherwise noted.  6. Definity  used; akinesis of the distal anteroseptal wall and apex; overall mild to moderate LV dysfunction; grade 1 diastolic  dysfunction; mild LVH.  EKG: Vent. rate 64 BPM PR interval 180 ms QRS duration 106 ms QT/QTcB 446/461 ms P-R-T axes 25 -22 21 Sinus rhythm Inferior infarct, old Anterior infarct, old  Assessment and Plan: Principal Problem:   Fever Associated with:   Acute encephalopathy  In the setting of:   UTI (urinary tract infection) due to urinary indwelling catheter (HCC) Admit to telemetry/inpatient. Continue IV fluids. Continue Zosyn  renal dosing. Follow-up urine culture and sensitivity. Follow-up blood culture and sensitivity Follow CBC and CMP in a.m.  Active Problems:   AKI (acute kidney injury) (HCC) Superimposed on:   Stage 3a chronic kidney disease (HCC) Hold ARB. Hold diuretic. Continue time-limited IV hydration. Monitor intake and output. Follow-up renal function and electrolytes.    Hyperlipidemia Continue atorvastatin  80 mg p.o. daily.    HTN (hypertension) Was hypotensive earlier. Hold antihypertensives for now. May resume in AM.    Coronary artery disease due to lipid rich plaque Continue aspirin  and statin. Holding carvedilol  due to hypotension.    Primary gout Continue allopurinol  100 mg p.o. daily.    Hypothyroidism Continue levothyroxine  75 mcg p.o. daily.    Prediabetes Follow-up fasting glucose.    Rheumatoid arthritis (HCC) Continue leflunomide  20 mg p.o. daily.     Advance Care Planning:   Code Status: Full Code   Consults:   Family Communication:   Severity of Illness: The appropriate patient status for this patient is INPATIENT. Inpatient status is judged to be reasonable and necessary in order to provide the required intensity of service to ensure the patient's safety. The patient's presenting symptoms, physical exam findings, and initial radiographic and laboratory data in the context of their chronic comorbidities is felt to place them at high risk for further clinical deterioration. Furthermore, it is not anticipated that the  patient will be medically stable for discharge from the hospital within 2 midnights of admission.   * I certify that at the point of admission it is my clinical judgment that the patient will require inpatient hospital care spanning beyond 2 midnights from the point of admission due to high intensity of service, high risk for further deterioration and high frequency of surveillance required.*  Author: Alm Dorn Castor,  MD 03/21/2024 12:13 PM  For on call review [email or web address].   This document was prepared using Dragon voice recognition software and may contain some unintended transcription errors.

## 2024-03-22 DIAGNOSIS — D649 Anemia, unspecified: Secondary | ICD-10-CM | POA: Diagnosis present

## 2024-03-22 DIAGNOSIS — T83511A Infection and inflammatory reaction due to indwelling urethral catheter, initial encounter: Secondary | ICD-10-CM | POA: Diagnosis not present

## 2024-03-22 DIAGNOSIS — N390 Urinary tract infection, site not specified: Secondary | ICD-10-CM | POA: Diagnosis not present

## 2024-03-22 LAB — COMPREHENSIVE METABOLIC PANEL WITH GFR
ALT: 27 U/L (ref 0–44)
AST: 57 U/L — ABNORMAL HIGH (ref 15–41)
Albumin: 2.7 g/dL — ABNORMAL LOW (ref 3.5–5.0)
Alkaline Phosphatase: 174 U/L — ABNORMAL HIGH (ref 38–126)
Anion gap: 11 (ref 5–15)
BUN: 43 mg/dL — ABNORMAL HIGH (ref 8–23)
CO2: 18 mmol/L — ABNORMAL LOW (ref 22–32)
Calcium: 8 mg/dL — ABNORMAL LOW (ref 8.9–10.3)
Chloride: 106 mmol/L (ref 98–111)
Creatinine, Ser: 2.72 mg/dL — ABNORMAL HIGH (ref 0.61–1.24)
GFR, Estimated: 22 mL/min — ABNORMAL LOW (ref >60)
Glucose, Bld: 106 mg/dL — ABNORMAL HIGH (ref 70–99)
Potassium: 4.1 mmol/L (ref 3.5–5.1)
Sodium: 135 mmol/L (ref 135–145)
Total Bilirubin: 0.8 mg/dL (ref 0.0–1.2)
Total Protein: 5.4 g/dL — ABNORMAL LOW (ref 6.5–8.1)

## 2024-03-22 LAB — BLOOD CULTURE ID PANEL (REFLEXED) - BCID2

## 2024-03-22 LAB — CBC
HCT: 27.7 % — ABNORMAL LOW (ref 39.0–52.0)
Hemoglobin: 8.7 g/dL — ABNORMAL LOW (ref 13.0–17.0)
MCH: 30 pg (ref 26.0–34.0)
MCHC: 31.4 g/dL (ref 30.0–36.0)
MCV: 95.5 fL (ref 80.0–100.0)
Platelets: 112 K/uL — ABNORMAL LOW (ref 150–400)
RBC: 2.9 MIL/uL — ABNORMAL LOW (ref 4.22–5.81)
RDW: 16.8 % — ABNORMAL HIGH (ref 11.5–15.5)
WBC: 8.2 K/uL (ref 4.0–10.5)
nRBC: 0 % (ref 0.0–0.2)

## 2024-03-22 MED ORDER — SODIUM CHLORIDE 0.9 % IV SOLN
2.0000 g | INTRAVENOUS | Status: DC
  Administered 2024-03-22 – 2024-03-23 (×4): 2 g via INTRAVENOUS
  Filled 2024-03-22 (×2): qty 20

## 2024-03-22 MED ORDER — SENNOSIDES-DOCUSATE SODIUM 8.6-50 MG PO TABS
1.0000 | ORAL_TABLET | Freq: Every day | ORAL | Status: DC
  Administered 2024-03-22 – 2024-03-24 (×6): 1 via ORAL
  Filled 2024-03-22 (×3): qty 1

## 2024-03-22 MED ORDER — ACETAMINOPHEN 500 MG PO TABS
1000.0000 mg | ORAL_TABLET | Freq: Three times a day (TID) | ORAL | Status: DC
  Administered 2024-03-22 – 2024-03-25 (×17): 1000 mg via ORAL
  Filled 2024-03-22 (×9): qty 2

## 2024-03-22 MED ORDER — CARVEDILOL 3.125 MG PO TABS
3.1250 mg | ORAL_TABLET | Freq: Two times a day (BID) | ORAL | Status: DC
  Administered 2024-03-22 – 2024-03-25 (×13): 3.125 mg via ORAL
  Filled 2024-03-22 (×7): qty 1

## 2024-03-22 MED ORDER — SODIUM CHLORIDE 0.9 % IV SOLN: INTRAVENOUS | Status: DC

## 2024-03-22 MED ORDER — CHLORHEXIDINE GLUCONATE CLOTH 2 % EX PADS
6.0000 | MEDICATED_PAD | Freq: Every day | CUTANEOUS | Status: DC
  Administered 2024-03-22 – 2024-03-25 (×7): 6 via TOPICAL

## 2024-03-22 MED ORDER — POLYETHYLENE GLYCOL 3350 17 G PO PACK: 17.0000 g | PACK | Freq: Every day | ORAL | Status: DC | PRN

## 2024-03-22 MED ORDER — OXYCODONE HCL ER 10 MG PO T12A
10.0000 mg | EXTENDED_RELEASE_TABLET | Freq: Two times a day (BID) | ORAL | Status: DC
  Administered 2024-03-22 – 2024-03-25 (×13): 10 mg via ORAL
  Filled 2024-03-22 (×7): qty 1

## 2024-03-22 MED ORDER — METHOCARBAMOL 500 MG PO TABS
500.0000 mg | ORAL_TABLET | Freq: Four times a day (QID) | ORAL | Status: DC | PRN
  Administered 2024-03-22 – 2024-03-23 (×4): 500 mg via ORAL
  Filled 2024-03-22 (×2): qty 1

## 2024-03-22 NOTE — Progress Notes (Signed)
 PT Cancellation Note  Patient Details Name: Connor Hansen MRN: [id number] DOB: [birth year]   Cancelled Treatment:    Reason Eval/Treat Not Completed: Fatigue/lethargy limiting ability to participate  Will check back later. [name] PT Acute Rehabilitation Services Office [phone]  [name] 03/22/2024, 1:01 PM

## 2024-03-22 NOTE — Evaluation (Signed)
 Physical Therapy Evaluation Patient Details Name: Connor Hansen MRN: [id number] DOB: 15-Jul-1934 Today's Date: 03/22/2024  History of Present Illness  Connor Hansen is a 89 y.o. male with recent UTI on abx was found on floor by son and admitted to [facility] with dx fever, acute encephalopathy, UTI PMH: CAD, NSTEMI in 1992, CKD, GERD, hypertension, hyperlipidemia, prediabetes, gout, hypothyroidism, cervical disc disease, osteoarthritis, rheumatoid arthritis on Enbrel and weekly methotrexate, stage IIIa CKD  Clinical Impression  Pt admitted with above diagnosis.  Pt currently with functional limitations due to the deficits listed below (see PT Problem List). Pt will benefit from acute skilled PT to increase their independence and safety with mobility to allow discharge.       The patient  requiring mod assistance for  mobility, ambulated x ~60' using a RW, needing assistance for balance and keep moving in forward  progression, narrow base.    Patient was residing alone and driving prior to fall. Patient's son and Daughter in law are  supportive but live about 25 miles away.  Patient will benefit from continued inpatient follow up therapy, <3 hours/day unless family available to provide 24/7 assistance.                                          BP 152/72, after reporting feeling slightly dizzy., SPO2 99%.  If plan is discharge home, recommend the following: A little help with walking and/or transfers;A little help with bathing/dressing/bathroom;Assistance with cooking/housework;Assist for transportation;Help with stairs or ramp for entrance   Can travel by private vehicle        Equipment Recommendations Rolling walker (2 wheels)  Recommendations for Other Services       Functional Status Assessment Patient has had a recent decline in their functional status and demonstrates the ability to make significant improvements in function in a reasonable and predictable amount of time.     Precautions /  Restrictions Precautions Precautions: Fall Precaution/Restrictions Comments: mac degen, Foley Restrictions Weight Bearing Restrictions Per Provider Order: No      Mobility  Bed Mobility Overal bed mobility: Needs Assistance Bed Mobility: Supine to Sit     Supine to sit: Mod assist, HOB elevated, Used rails     General bed mobility comments: increased time and effort to scoot to EOB and gain sitting balance    Transfers     Transfers: Sit to/from Stand, Bed to chair/wheelchair/BSC     Step pivot transfers: Mod assist, From elevated surface, Min assist       General transfer comment: posterior  bias tanding up, propped on bed , wheels off ground    Ambulation/Gait Ambulation/Gait assistance: Min assist Gait Distance (Feet): 60 Feet Assistive device: Rolling walker (2 wheels) Gait Pattern/deviations: Step-to pattern, Decreased step length - left, Decreased step length - right, Decreased weight shift to right, Decreased weight shift to left Gait velocity: decr     General Gait Details: very short steps, required assistance to keep moving in forward progression, when therapist ceased  poushing Rw forward,  pt almost stops  Stairs            Wheelchair Mobility     Tilt Bed    Modified Rankin (Stroke Patients Only)       Balance Overall balance assessment: Needs assistance Sitting-balance support: Feet supported Sitting balance-Leahy Scale: Poor Sitting balance - Comments: progressed to Fair after  foot support Postural control: Posterior lean Standing balance support: Reliant on assistive device for balance, Bilateral upper extremity supported Standing balance-Leahy Scale: Poor Standing balance comment: posterior bias                             Pertinent Vitals/Pain Pain Assessment Pain Assessment: No/denies pain    Home Living Family/patient expects to be discharged to:: Private residence Living Arrangements: Alone Available Help  at Discharge: Family;Available PRN/intermittently Type of Home: House Home Access: Stairs to enter   Entrance Stairs-Number of Steps: 2   Home Layout: Two level;Able to live on main level with bedroom/bathroom Home Equipment: Grab bars - tub/shower;Shower seat      Prior Function Prior Level of Function : Driving;Independent/Modified Independent             Mobility Comments: ltd driving due to macular degen ADLs Comments: DIL makes most meals, able to complete light IADL's but was indep with ADL's including chronic foley management     Extremity/Trunk Assessment   Upper Extremity Assessment Upper Extremity Assessment: Generalized weakness;Right hand dominant    Lower Extremity Assessment Lower Extremity Assessment: Generalized weakness    Cervical / Trunk Assessment Cervical / Trunk Assessment: Normal  Communication   Communication Communication: Impaired Factors Affecting Communication: Hearing impaired    Cognition Arousal: Alert Behavior During Therapy: WFL for tasks assessed/performed   PT - Cognitive impairments: No apparent impairments                         Following commands: Impaired Following commands impaired: Follows one step commands with increased time     Cueing Cueing Techniques: Verbal cues, Gestural cues, Tactile cues     General Comments General comments (skin integrity, edema, etc.): decreased activity tolerance, reports fatigue and mild SOB with amb in hallway but sats remained 97% on RA    Exercises     Assessment/Plan    PT Assessment Patient needs continued PT services  PT Problem List Decreased strength;Decreased mobility;Decreased safety awareness;Decreased knowledge of precautions;Decreased activity tolerance;Decreased cognition;Decreased balance;Decreased knowledge of use of DME       PT Treatment Interventions DME instruction;Therapeutic activities;Cognitive remediation;Gait training;Therapeutic  exercise;Patient/family education;Functional mobility training    PT Goals (Current goals can be found in the Care Plan section)  Acute Rehab PT Goals Patient Stated Goal: [place], to [place](states [place]) PT Goal Formulation: With patient/family Time For Goal Achievement: 04/05/24 Potential to Achieve Goals: Good    Frequency Min 2X/week     Co-evaluation PT/OT/SLP Co-Evaluation/Treatment: Yes Reason for Co-Treatment: Complexity of the patient's impairments (multi-system involvement) PT goals addressed during session: Mobility/safety with mobility;Balance;Proper use of DME OT goals addressed during session: ADL's and self-care;Proper use of Adaptive equipment and DME       AM-PAC PT 6 Clicks Mobility  Outcome Measure Help needed turning from your back to your side while in a flat bed without using bedrails?: A Lot Help needed moving from lying on your back to sitting on the side of a flat bed without using bedrails?: A Lot Help needed moving to and from a bed to a chair (including a wheelchair)?: A Lot Help needed standing up from a chair using your arms (e.g., wheelchair or bedside chair)?: A Lot Help needed to walk in hospital room?: A Lot Help needed climbing 3-5 steps with a railing? : Total 6 Click Score: 11    End  of Session Equipment Utilized During Treatment: Gait belt Activity Tolerance: Patient tolerated treatment well Patient left: in chair;with call bell/phone within reach;with chair alarm set Nurse Communication: Mobility status PT Visit Diagnosis: Unsteadiness on feet (R26.81);Difficulty in walking, not elsewhere classified (R26.2)    Time: 1351-1415 PT Time Calculation (min) (ACUTE ONLY): 24 min   Charges:   PT Evaluation $PT Eval Low Complexity: 1 Low   PT General Charges $$ ACUTE PT VISIT: 1 Visit         [name] PT Acute Rehabilitation Services Office [phone]   [name] 03/22/2024, 3:43 PM

## 2024-03-22 NOTE — Plan of Care (Signed)

## 2024-03-22 NOTE — Progress Notes (Signed)
 PHARMACY - PHYSICIAN COMMUNICATION CRITICAL VALUE ALERT - BLOOD CULTURE IDENTIFICATION (BCID)  Connor Hansen is an 89 y.o. male who presented to [facility] on 03/21/2024 with a chief complaint of generalized weakness, fever, and decreased appetite.   Assessment:  2/4 Bcx bottles with GNRs, BCID showing E coli with no resistance   Name of physician (or Provider) Contacted: Lynwood Kipper  Current antibiotics: Zosyn   Changes to prescribed antibiotics recommended:  Consider de-escalating to ceftriaxone  2g IV daily while awaiting culture finalization   Results for orders placed or performed during the hospital encounter of 03/21/24  Blood Culture ID Panel (Reflexed) (Collected: 03/21/2024  9:24 AM)  Result Value Ref Range   Enterococcus faecalis NOT DETECTED NOT DETECTED   Enterococcus Faecium NOT DETECTED NOT DETECTED   Listeria monocytogenes NOT DETECTED NOT DETECTED   Staphylococcus species NOT DETECTED NOT DETECTED   Staphylococcus aureus (BCID) NOT DETECTED NOT DETECTED   Staphylococcus epidermidis NOT DETECTED NOT DETECTED   Staphylococcus lugdunensis NOT DETECTED NOT DETECTED   Streptococcus species NOT DETECTED NOT DETECTED   Streptococcus agalactiae NOT DETECTED NOT DETECTED   Streptococcus pneumoniae NOT DETECTED NOT DETECTED   Streptococcus pyogenes NOT DETECTED NOT DETECTED   A.calcoaceticus-baumannii NOT DETECTED NOT DETECTED   Bacteroides fragilis NOT DETECTED NOT DETECTED   Enterobacterales DETECTED (A) NOT DETECTED   Enterobacter cloacae complex NOT DETECTED NOT DETECTED   Escherichia coli DETECTED (A) NOT DETECTED   Klebsiella aerogenes NOT DETECTED NOT DETECTED   Klebsiella oxytoca NOT DETECTED NOT DETECTED   Klebsiella pneumoniae NOT DETECTED NOT DETECTED   Proteus species NOT DETECTED NOT DETECTED   Salmonella species NOT DETECTED NOT DETECTED   Serratia marcescens NOT DETECTED NOT DETECTED   Haemophilus influenzae NOT DETECTED NOT DETECTED   Neisseria  meningitidis NOT DETECTED NOT DETECTED   Pseudomonas aeruginosa NOT DETECTED NOT DETECTED   Stenotrophomonas maltophilia NOT DETECTED NOT DETECTED   Candida albicans NOT DETECTED NOT DETECTED   Candida auris NOT DETECTED NOT DETECTED   Candida glabrata NOT DETECTED NOT DETECTED   Candida krusei NOT DETECTED NOT DETECTED   Candida parapsilosis NOT DETECTED NOT DETECTED   Candida tropicalis NOT DETECTED NOT DETECTED   Cryptococcus neoformans/gattii NOT DETECTED NOT DETECTED   CTX-M ESBL NOT DETECTED NOT DETECTED   Carbapenem resistance IMP NOT DETECTED NOT DETECTED   Carbapenem resistance KPC NOT DETECTED NOT DETECTED   Carbapenem resistance NDM NOT DETECTED NOT DETECTED   Carbapenem resist OXA 48 LIKE NOT DETECTED NOT DETECTED   Carbapenem resistance VIM NOT DETECTED NOT DETECTED    Lacinda Moats, PharmD Clinical Pharmacist  8/11/20254:18 AM

## 2024-03-22 NOTE — Plan of Care (Signed)

## 2024-03-22 NOTE — Evaluation (Signed)
 Occupational Therapy Evaluation Patient Details Name: Connor Hansen MRN: [id number] DOB: 03-Jun-1934 Today's Date: 03/22/2024   History of Present Illness   Connor Hansen is a 89 y.o. male with recent UTI on abx was found on floor by son and admitted to [facility] with dx fever, acute encephalopathy, UTI PMH: CAD, NSTEMI in 1992, CKD, GERD, hypertension, hyperlipidemia, prediabetes, gout, hypothyroidism, cervical disc disease, osteoarthritis, rheumatoid arthritis on Enbrel and weekly methotrexate, stage IIIa CKD     Clinical Impressions PTA, patient lives alone at home at an indep level ambulating without AD and driving short distances despite mac degen visual issues and use of shower chair for bathing. Son and DIL who live ~ 25 mil away assist with some meals and higher level IADL's from outside home but patient reports he was able to prepare light meals. He noted decline in STM and overall strength about 1 week ago.  Currently, patient presents sub-baseline functional level with deficits outlined below (see OT Problem List for details) most significantly generalized muscle weakness, decreased activity tolerance, balance and cognition with visual deficits limiting BADL's and functional mobility. Patient requires continued Acute care hospital level OT services to progress safety and functional performance and allow for discharge. Patient will benefit from continued inpatient follow up therapy, <3 hours/day.        If plan is discharge home, recommend the following:   A lot of help with walking and/or transfers;A lot of help with bathing/dressing/bathroom;Assistance with cooking/housework;Direct supervision/assist for medications management;Direct supervision/assist for financial management;Assist for transportation;Help with stairs or ramp for entrance;Supervision due to cognitive status     Functional Status Assessment   Patient has had a recent decline in their functional status and demonstrates  the ability to make significant improvements in function in a reasonable and predictable amount of time.     Equipment Recommendations   None recommended by OT (TBD post rehab)      Precautions/Restrictions   Precautions Precautions: Fall Recall of Precautions/Restrictions: Intact Precaution/Restrictions Comments: mac degen, Foley Restrictions Weight Bearing Restrictions Per Provider Order: No     Mobility Bed Mobility Overal bed mobility: Needs Assistance Bed Mobility: Supine to Sit     Supine to sit: Mod assist, HOB elevated, Used rails     General bed mobility comments: increased time and effort to scoot to EOB and gain sitting balance    Transfers Overall transfer level: Needs assistance Equipment used: Rolling walker (2 wheels) Transfers: Sit to/from Stand, Bed to chair/wheelchair/BSC Sit to Stand: Mod assist, From elevated surface     Step pivot transfers: Mod assist, From elevated surface, Min assist     General transfer comment: short distance amb with mod A initially and min A after therapist cues and faciliatation with RW anc cues for hand placement and directionality      Balance Overall balance assessment: Needs assistance Sitting-balance support: Feet supported Sitting balance-Leahy Scale: Poor Sitting balance - Comments: progressed to Fair after foot support Postural control: Posterior lean Standing balance support: Reliant on assistive device for balance, Bilateral upper extremity supported Standing balance-Leahy Scale: Poor                             ADL either performed or assessed with clinical judgement   ADL Overall ADL's : Needs assistance/impaired Eating/Feeding: Set up;Sitting   Grooming: Wash/dry hands;Wash/dry face;Sitting;Set up   Upper Body Bathing: Minimal assistance;Sitting   Lower Body Bathing: Maximal assistance;Sit to/from stand  Upper Body Dressing : Moderate assistance;Sitting   Lower Body Dressing:  Maximal assistance;Sit to/from stand   Toilet Transfer: Moderate assistance   Toileting- Clothing Manipulation and Hygiene: Maximal assistance Toileting - Clothing Manipulation Details (indicate cue type and reason): Foley management     Functional mobility during ADLs: Minimal assistance;Moderate assistance;Rolling walker (2 wheels) General ADL Comments: increased time and effort for ADL mobility and need for rests     Vision Baseline Vision/History: 1 Wears glasses;6 Macular Degeneration Ability to See in Adequate Light: 3 Highly impaired Patient Visual Report: No change from baseline;Central vision impairment Vision Assessment?: Wears glasses for driving;Wears glasses for reading     Perception Perception: Impaired Preception Impairment Details: Spatial orientation, Figure ground     Praxis Praxis: WFL       Pertinent Vitals/Pain Pain Assessment Pain Assessment: No/denies pain     Extremity/Trunk Assessment Upper Extremity Assessment Upper Extremity Assessment: Generalized weakness;Right hand dominant   Lower Extremity Assessment Lower Extremity Assessment: Defer to PT evaluation   Cervical / Trunk Assessment Cervical / Trunk Assessment: Normal   Communication Communication Communication: Impaired Factors Affecting Communication: Hearing impaired   Cognition Arousal: Alert Behavior During Therapy: WFL for tasks assessed/performed Cognition: Cognition impaired   Orientation impairments: Time Awareness: Online awareness impaired Memory impairment (select all impairments): Short-term memory (patient reports STM issues started about a week ago) Attention impairment (select first level of impairment): Sustained attention Executive functioning impairment (select all impairments): Problem solving OT - Cognition Comments: increased processing time needed with decreased mental stamina due to fatigue                 Following commands: Impaired Following  commands impaired: Follows one step commands with increased time (occasional repetition needed)     Cueing  General Comments   Cueing Techniques: Verbal cues;Gestural cues;Tactile cues  decreased activity tolerance, reports fatigue and mild SOB with amb in hallway but sats remained 97% on RA           Home Living Family/patient expects to be discharged to:: Private residence Living Arrangements: Alone Available Help at Discharge: Family Type of Home: House Home Access: Stairs to enter [employer] of Steps: 2   Home Layout: Two level;Able to live on main level with bedroom/bathroom     Bathroom Shower/Tub: Producer, television/film/video: Handicapped height     Home Equipment: Grab bars - tub/shower;Shower seat          Prior Functioning/Environment Prior Level of Function : Driving;Independent/Modified Independent             Mobility Comments: ltd driving due to macular degen ADLs Comments: DIL makes most meals, able to complete light IADL's but was indep with ADL's including chronic foley management    OT Problem List: Decreased strength;Decreased activity tolerance;Impaired balance (sitting and/or standing);Impaired vision/perception;Decreased coordination;Decreased cognition;Decreased safety awareness;Decreased knowledge of use of DME or AE;Decreased knowledge of precautions;Cardiopulmonary status limiting activity   OT Treatment/Interventions: Self-care/ADL training;Therapeutic exercise;Neuromuscular education;Energy conservation;DME and/or AE instruction;Therapeutic activities;Cognitive remediation/compensation;Visual/perceptual remediation/compensation;Patient/family education;Balance training      OT Goals(Current goals can be found in the care plan section)   Acute Rehab OT Goals Patient Stated Goal: to feel better OT Goal Formulation: With patient Time For Goal Achievement: 04/05/24 Potential to Achieve Goals: Good ADL Goals Pt Will  Perform Lower Body Bathing: with supervision;sit to/from stand Pt Will Perform Lower Body Dressing: with supervision;sit to/from stand Pt Will Transfer to Toilet: ambulating;with supervision Pt Will Perform Toileting -  Clothing Manipulation and hygiene: with supervision;sit to/from stand Pt Will Perform Tub/Shower Transfer: with contact guard assist;shower seat;Shower transfer;ambulating Pt/caregiver will Perform Home Exercise Program: Increased strength;Both right and left upper extremity;With written HEP provided;Independently Additional ADL Goal #1: Patient will demonstrate integ of ECT strategies into ADL's and mobility indep   OT Frequency:  Min 2X/week    Co-evaluation PT/OT/SLP Co-Evaluation/Treatment: Yes Reason for Co-Treatment: Complexity of the patient's impairments (multi-system involvement) PT goals addressed during session: Mobility/safety with mobility;Balance;Proper use of DME OT goals addressed during session: ADL's and self-care;Proper use of Adaptive equipment and DME      AM-PAC OT 6 Clicks Daily Activity     Outcome Measure Help from another person eating meals?: None Help from another person taking care of personal grooming?: A Little Help from another person toileting, which includes using toliet, bedpan, or urinal?: A Lot Help from another person bathing (including washing, rinsing, drying)?: A Lot Help from another person to put on and taking off regular upper body clothing?: A Little Help from another person to put on and taking off regular lower body clothing?: A Lot 6 Click Score: 16   End of Session Equipment Utilized During Treatment: Gait belt;Rolling walker (2 wheels) Nurse Communication: Mobility status;Other (comment) (updated assist level on white board)  Activity Tolerance: Patient limited by fatigue Patient left: in chair;with call bell/phone within reach;with chair alarm set  OT Visit Diagnosis: Unsteadiness on feet (R26.81);Other  abnormalities of gait and mobility (R26.89);History of falling (Z91.81);Muscle weakness (generalized) (M62.81);Low vision, both eyes (H54.2);Cognitive communication deficit (R41.841) Symptoms and signs involving cognitive functions:  (affects of UTI)                Time: 1350-1415 OT Time Calculation (min): 25 min Charges:  OT General Charges $OT Visit: 1 Visit OT Evaluation $OT Eval Low Complexity: 1 Low  Rogen Porte OT/L Acute Rehabilitation Department  [phone]  03/22/2024, 2:56 PM

## 2024-03-22 NOTE — Progress Notes (Signed)
 PROGRESS NOTE  [name]  DOB: 10/14/1933  PCP: Ransom Other, MD FMW:[id number]  DOA: 03/21/2024  LOS: 1 day  Hospital Day: 2  Brief narrative: CAYDE HELD is a 89 y.o. male with PMH significant for HTN, HLD, CAD, CKD, RA, GERD, gout, hypothyroidism, osteoarthritis, rheumatoid arthritis on Enbrel and weekly methotrexate, recently diagnosed with prostate cancer, has indwelling Foley catheter. 8/10, patient presented to the ED with complaint of generalized weakness, fever, decreased appetite.  He was recently started on a course of Bactrim by urologist for UTI.  Despite being on antibiotic for 2 days, weakness continued.  He was found on the floor confused by his son and brought to the ED.  Lives at home alone.  In the ED, patient was initially afebrile, blood pressure in the low normal range. In few hours he mounted a fever of 101.2 Labs showed WC count 8.8, hemoglobin 7.7, platelet 102, lactic acid normal, sodium 130, serum bicarb 18, BUN/creatinine 38/2.69 Urinalysis showed cloudy yellow urine with large leukocytes, rare bacteria, negative nitrite CT abdomen pelvis did not show any obstructive ureteral calculi but showed stable moderate bilateral hydroureteronephrosis and stable mildly enlarged prostate, stable diffuse sclerotic bone metastases. Urine culture and blood culture were sent.  Patient was started on IV Rocephin , IV fluid Admitted to [facility]  Subjective: Patient was seen and examined this morning.  Pleasant elderly Caucasian male.  Propped up in bed.  Not in distress.  Mental status gradually improving.  Daughter-in-law at bedside. Chart reviewed Tmax 101.2 at 8 PM, no fever since then Labs from this morning showed creatinine slightly up at 2.72, hemoglobin better at 8.7, platelet at 112  Assessment and plan: UTI associated with indwelling Foley catheter  E. coli UTI and bacteremia Presented with fever, weakness Urinalysis suggestive of infection Urine culture and  blood preliminary culture showing E. Coli Currently on IV Rocephin  WBC count and lactic acid level normal Continue to trend temperature curve Recent Labs  Lab 03/21/24 0924 03/21/24 0938 03/21/24 1140 03/22/24 0521  WBC 8.8  --   --  8.2  LATICACIDVEN  --  1.0 1.2  --    AKI on CKD 3b Acute metabolic acidosis Baseline creatinine 1.89 from July 2025.  Patient with creatinine elevated to 2.69 and bicarb low at 18 Creatinine remains elevated this morning. IV hydration to continue today with NS at 75 mL/h Continue to monitor Recent Labs    03/02/24 1620 03/21/24 0924 03/22/24 0521  BUN 18 38* 43*  CREATININE 1.89* 2.69* 2.72*  CO2 24 18* 18*   Metastatic prostate cancer Bilateral hydronephrosis Has indwelling Foley catheter.   Per CT scan, bilateral hydronephrosis and diffuse bone mets are stable. Per daughter-in-law at bedside, urologist planned to maintain Foley catheter for foreseeable future.  Acute metabolic encephalopathy Was confused, altered at presentation secondary to UTI.  Gradually improving per family.  Continue to monitor  H/o systolic CHF  Hypertension PTA meds- carvedilol  3.125 mg twice daily, doxazosin 2 mg nightly, amlodipine 10 mg daily, valsartan 160 mg daily, Lasix  as needed All BP meds currently on hold due to hypotension and AKI. Blood pressure gradually improving.  Resume Coreg  this morning.  Continue to monitor Most recent echo from 2020 with EF 40 to 45%.  Repeat echo  H/o CAD HLD Continue Coreg , aspirin , statin   Rheumatoid arthritis Continue leflunomide  20 mg p.o. daily  Primary gout Continue allopurinol  100 mg p.o. daily  Hypothyroidism Continue levothyroxine  75 mcg p.o. daily.   Chronic normocytic  anemia  Hemoglobin at 8.7 today.  No active bleeding.  Continue to monitor Recent Labs    03/02/24 1620 03/21/24 0924 03/22/24 0521  HGB 9.4* 7.7* 8.7*  MCV 93.6 91.3 95.5     Mobility:  PT Orders: Active   PT Follow up Rec:     Goals of care   Code Status: Full Code     DVT prophylaxis:  SCDs Start: 03/21/24 1311   Antimicrobials: IV Rocephin  Fluid: NS at 75 mL/h Consultants: None Family Communication: Daughter-in-law at bedside  Status: Inpatient Level of care:  Telemetry   Patient is from: Home Needs to continue in-hospital care: Needs IV antibiotics, renal function monitoring, blood pressure monitoring Anticipated d/c to: Pending PT eval    Diet:  Diet Order             Diet Heart Room service appropriate? Yes; Fluid consistency: Thin  Diet effective now                   Scheduled Meds:  allopurinol   100 mg Oral QPM   aspirin   81 mg Oral QPM   atorvastatin   80 mg Oral QPM   carvedilol   3.125 mg Oral BID   Chlorhexidine  Gluconate Cloth  6 each Topical Daily   famotidine   20 mg Oral QHS   feeding supplement  237 mL Oral BID BM   ferrous sulfate   325 mg Oral Daily   folic acid   2 mg Oral BID   leflunomide   20 mg Oral Daily   levothyroxine   75 mcg Oral Q0600   omega-3 acid ethyl esters  1 g Oral BID   pantoprazole   40 mg Oral Daily   senna-docusate  1 tablet Oral QHS    PRN meds: acetaminophen  **OR** acetaminophen , ondansetron  **OR** ondansetron  (ZOFRAN ) IV, oxybutynin , polyethylene glycol, traMADol    Infusions:   sodium chloride      cefTRIAXone  (ROCEPHIN )  IV 2 g (03/22/24 1106)    Antimicrobials: Anti-infectives (From admission, onward)    Start     Dose/Rate Route Frequency Ordered Stop   03/22/24 1000  cefTRIAXone  (ROCEPHIN ) 2 g in sodium chloride  0.9 % 100 mL IVPB        2 g 200 mL/hr over 30 Minutes Intravenous Every 24 hours 03/22/24 0427     03/21/24 2000  piperacillin -tazobactam (ZOSYN ) IVPB 3.375 g  Status:  Discontinued        3.375 g 12.5 mL/hr over 240 Minutes Intravenous Every 12 hours 03/21/24 1721 03/22/24 0427   03/21/24 1800  piperacillin -tazobactam (ZOSYN ) IVPB 2.25 g  Status:  Discontinued        2.25 g 100 mL/hr over 30 Minutes Intravenous  Every 8 hours 03/21/24 1032 03/21/24 1719   03/21/24 1030  piperacillin -tazobactam (ZOSYN ) IVPB 3.375 g        3.375 g 100 mL/hr over 30 Minutes Intravenous  Once 03/21/24 1020 03/21/24 1123       Objective: Vitals:   03/22/24 0122 03/22/24 0510  BP: (!) 100/51 (!) 140/59  Pulse: 69 71  Resp: 18 18  Temp: 98.5 F (36.9 C) 99.1 F (37.3 C)  SpO2:  98%    Intake/Output Summary (Last 24 hours) at 03/22/2024 1117 Last data filed at 03/22/2024 1107 Gross per 24 hour  Intake 1431.55 ml  Output 2450 ml  Net -1018.45 ml   Filed Weights   03/21/24 0835 03/21/24 1324  Weight: 79.8 kg 80.4 kg   Weight change:  Body mass index is 26.18 kg/m.  Physical Exam: General exam: Pleasant, elderly Caucasian male Skin: No rashes, lesions or ulcers. HEENT: Atraumatic, normocephalic, no obvious bleeding Lungs: Clear to auscultation bilaterally,  CVS: S1, S2, no murmur,   GI/Abd: Soft, nontender, nondistended, bowel sound present,   CNS: Alert, awake, slow to respond, oriented to place and person Psychiatry: Sad affect Extremities: No pedal edema, no calf tenderness,   Data Review: I have personally reviewed the laboratory data and studies available.  F/u labs ordered Unresulted Labs (From admission, onward)     Start     Ordered   03/23/24 0500  Basic metabolic panel with GFR  Tomorrow morning,   R        03/22/24 1117   03/23/24 0500  CBC with Differential/Platelet  Tomorrow morning,   R        03/22/24 1117            Signed, Chapman Rota, MD [facility] 03/22/2024

## 2024-03-23 ENCOUNTER — Inpatient Hospital Stay (HOSPITAL_COMMUNITY)

## 2024-03-23 ENCOUNTER — Telehealth: Payer: Self-pay | Admitting: Cardiology

## 2024-03-23 DIAGNOSIS — R9431 Abnormal electrocardiogram [ECG] [EKG]: Secondary | ICD-10-CM | POA: Diagnosis not present

## 2024-03-23 DIAGNOSIS — T83511A Infection and inflammatory reaction due to indwelling urethral catheter, initial encounter: Secondary | ICD-10-CM | POA: Diagnosis not present

## 2024-03-23 DIAGNOSIS — N390 Urinary tract infection, site not specified: Secondary | ICD-10-CM | POA: Diagnosis not present

## 2024-03-23 LAB — URINE CULTURE: Culture: >=100000 — AB

## 2024-03-23 LAB — ECHOCARDIOGRAM COMPLETE
AR max vel: 2.25 cm2
AV Area VTI: 2.19 cm2
AV Area mean vel: 2.24 cm2
AV Mean grad: 7.5 mmHg
AV Peak grad: 13.8 mmHg
Ao pk vel: 1.86 m/s
Area-P 1/2: 3.74 cm2
Calc EF: 66.4 %
Height: 69 in
S' Lateral: 3.3 cm
Single Plane A2C EF: 67.1 %
Single Plane A4C EF: 65.5 %
Weight: 2836 [oz_av]

## 2024-03-23 LAB — BASIC METABOLIC PANEL WITH GFR
Anion gap: 7 (ref 5–15)
BUN: 41 mg/dL — ABNORMAL HIGH (ref 8–23)
CO2: 20 mmol/L — ABNORMAL LOW (ref 22–32)
Calcium: 7.9 mg/dL — ABNORMAL LOW (ref 8.9–10.3)
Chloride: 107 mmol/L (ref 98–111)
Creatinine, Ser: 2.62 mg/dL — ABNORMAL HIGH (ref 0.61–1.24)
GFR, Estimated: 23 mL/min — ABNORMAL LOW (ref >60)
Glucose, Bld: 112 mg/dL — ABNORMAL HIGH (ref 70–99)
Potassium: 3.7 mmol/L (ref 3.5–5.1)
Sodium: 134 mmol/L — ABNORMAL LOW (ref 135–145)

## 2024-03-23 LAB — CBC WITH DIFFERENTIAL/PLATELET
Abs Granulocyte: 4.8 K/uL (ref 1.5–6.5)
Abs Immature Granulocytes: 0.04 K/uL (ref 0.00–0.07)
Basophils Absolute: 0 K/uL (ref 0.0–0.1)
Basophils Relative: 1 %
Eosinophils Absolute: 0.2 K/uL (ref 0.0–0.5)
Eosinophils Relative: 3 %
HCT: 25.1 % — ABNORMAL LOW (ref 39.0–52.0)
Hemoglobin: 8.1 g/dL — ABNORMAL LOW (ref 13.0–17.0)
Immature Granulocytes: 1 %
Lymphocytes Relative: 9 %
Lymphs Abs: 0.6 K/uL — ABNORMAL LOW (ref 0.7–4.0)
MCH: 29.5 pg (ref 26.0–34.0)
MCHC: 32.3 g/dL (ref 30.0–36.0)
MCV: 91.3 fL (ref 80.0–100.0)
Monocytes Absolute: 0.7 K/uL (ref 0.1–1.0)
Monocytes Relative: 11 %
Neutro Abs: 4.8 K/uL (ref 1.7–7.7)
Neutrophils Relative %: 75 %
Platelets: 120 K/uL — ABNORMAL LOW (ref 150–400)
RBC: 2.75 MIL/uL — ABNORMAL LOW (ref 4.22–5.81)
RDW: 17 % — ABNORMAL HIGH (ref 11.5–15.5)
WBC: 6.4 K/uL (ref 4.0–10.5)
nRBC: 0 % (ref 0.0–0.2)

## 2024-03-23 LAB — CULTURE, BLOOD (ROUTINE X 2)

## 2024-03-23 LAB — TSH: TSH: 4.299 u[IU]/mL (ref 0.350–4.500)

## 2024-03-23 MED ORDER — OXYCODONE HCL 5 MG PO TABS
5.0000 mg | ORAL_TABLET | Freq: Four times a day (QID) | ORAL | Status: DC | PRN
  Administered 2024-03-24 (×2): 5 mg via ORAL
  Filled 2024-03-23 (×2): qty 1

## 2024-03-23 MED ORDER — LEVOTHYROXINE SODIUM 112 MCG PO TABS
112.0000 ug | ORAL_TABLET | Freq: Every day | ORAL | Status: DC
  Administered 2024-03-24 – 2024-03-25 (×3): 112 ug via ORAL
  Filled 2024-03-23 (×2): qty 1

## 2024-03-23 MED ORDER — CEPHALEXIN 500 MG PO CAPS
1000.0000 mg | ORAL_CAPSULE | Freq: Two times a day (BID) | ORAL | Status: DC
  Administered 2024-03-24 – 2024-03-25 (×5): 1000 mg via ORAL
  Filled 2024-03-23 (×3): qty 2

## 2024-03-23 NOTE — Plan of Care (Signed)
  Problem: Activity: Goal: Risk for activity intolerance will decrease Outcome: Progressing   Problem: Nutrition: Goal: Adequate nutrition will be maintained Outcome: Progressing   Problem: Coping: Goal: Level of anxiety will decrease Outcome: Progressing   Problem: Pain Managment: Goal: General experience of comfort will improve and/or be controlled Outcome: Progressing   Problem: Safety: Goal: Ability to remain free from injury will improve Outcome: Progressing

## 2024-03-23 NOTE — NC FL2 (Signed)
 [place]  MEDICAID FL2 LEVEL OF CARE FORM     IDENTIFICATION  Patient Name: Connor Hansen Birthdate: 04-Jan-1934 Sex: male Admission Date (Current Location): 03/21/2024  [facility] and [place] Number:  Producer, television/film/video and Address:  [facility],  [address], [place] [address]      Provider Number: [id number]  Attending Physician Name and Address:  Arlice Reichert, MD  Relative Name and Phone Number:  Lamar, Meter [facility])  [phone]    Current Level of Care: Hospital Recommended Level of Care: Skilled Nursing Facility Prior Approval Number:    Date Approved/Denied:   PASRR Number: [id number]  Discharge Plan: SNF    Current Diagnoses: Patient Active Problem List   Diagnosis Date Noted   Normocytic anemia 03/22/2024   Cervical disc disease 03/21/2024   Dysthymia 03/21/2024   Gastro-esophageal reflux disease without esophagitis 03/21/2024   Primary gout 03/21/2024   Hypothyroidism 03/21/2024   Long term (current) use of immunomodulator 03/21/2024   Prediabetes 03/21/2024   Pure hypercholesterolemia 03/21/2024   Rheumatoid arthritis (HCC) 03/21/2024   Osteoarthritis 03/21/2024   Stage 3a chronic kidney disease (HCC) 03/21/2024   UTI (urinary tract infection) due to urinary indwelling catheter (HCC) 03/21/2024   AKI (acute kidney injury) (HCC) 03/21/2024   Fever 03/21/2024   Acute encephalopathy 03/21/2024   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   Coronary artery disease due to lipid rich plaque 07/20/2014   Hyperlipidemia 07/20/2013   HTN (hypertension) 07/20/2013   Angina decubitus (HCC) 07/20/2013   Old MI (myocardial infarction) 07/20/2013    Orientation RESPIRATION BLADDER Height & Weight     Self, Situation, Place  Normal Incontinent, Indwelling catheter Weight: 177 lb 4 oz (80.4 kg) Height:  5' 9 (175.3 cm)  BEHAVIORAL SYMPTOMS/MOOD NEUROLOGICAL BOWEL NUTRITION STATUS      Continent Diet (Regular)  AMBULATORY STATUS COMMUNICATION  OF NEEDS Skin   Limited Assist Verbally Normal                       Personal Care Assistance Level of Assistance  Bathing, Feeding, Dressing Bathing Assistance: Maximum assistance Feeding assistance: Limited assistance Dressing Assistance: Maximum assistance     Functional Limitations Info  Sight, Hearing, Speech Sight Info: Impaired (eyeglasses) Hearing Info: Impaired Speech Info: Adequate    SPECIAL CARE FACTORS FREQUENCY  PT (By licensed PT), OT (By licensed OT)     PT Frequency: 5x per week OT Frequency: 5x per week            Contractures Contractures Info: Not present    Additional Factors Info  Code Status, Allergies Code Status Info: FULL Allergies Info: NKA           Current Medications (03/23/2024):  This is the current hospital active medication list Current Facility-Administered Medications  Medication Dose Route Frequency Provider Last Rate Last Admin   0.9 %  sodium chloride  infusion   Intravenous Continuous Dahal, Binaya, MD 75 mL/hr at 03/22/24 1248 New Bag at 03/22/24 1248   acetaminophen  (TYLENOL ) tablet 650 mg  650 mg Oral Q6H PRN Celinda Alm Lot, MD   650 mg at 03/21/24 2054   Or   acetaminophen  (TYLENOL ) suppository 650 mg  650 mg Rectal Q6H PRN Celinda Alm Lot, MD       acetaminophen  (TYLENOL ) tablet 1,000 mg  1,000 mg Oral TID Dahal, Binaya, MD   1,000 mg at 03/23/24 0954   allopurinol  (ZYLOPRIM ) tablet 100 mg  100 mg Oral QPM Celinda,  Alm Lot, MD   100 mg at 03/22/24 1806   aspirin  chewable tablet 81 mg  81 mg Oral QPM Celinda Alm Lot, MD   81 mg at 03/22/24 1806   atorvastatin  (LIPITOR) tablet 80 mg  80 mg Oral QPM Celinda Alm Lot, MD   80 mg at 03/22/24 1806   carvedilol  (COREG ) tablet 3.125 mg  3.125 mg Oral BID Dahal, Binaya, MD   3.125 mg at 03/23/24 1045   [START ON 03/24/2024] cephALEXin  (KEFLEX ) capsule 1,000 mg  1,000 mg Oral Q12H Dahal, Chapman, MD       Chlorhexidine  Gluconate Cloth 2 % PADS 6 each  6 each  Topical Daily Celinda Alm Lot, MD   6 each at 03/23/24 0955   famotidine  (PEPCID ) tablet 20 mg  20 mg Oral QHS Celinda Alm Lot, MD   20 mg at 03/22/24 2102   feeding supplement (ENSURE PLUS HIGH PROTEIN) liquid 237 mL  237 mL Oral BID BM Celinda Alm Lot, MD   237 mL at 03/22/24 1051   ferrous sulfate  tablet 325 mg  325 mg Oral Daily Celinda Alm Lot, MD   325 mg at 03/23/24 9044   folic acid  (FOLVITE ) tablet 2 mg  2 mg Oral BID Celinda Alm Lot, MD   2 mg at 03/23/24 9044   leflunomide  (ARAVA ) tablet 20 mg  20 mg Oral Daily Celinda Alm Lot, MD   20 mg at 03/23/24 0955   [START ON 03/24/2024] levothyroxine  (SYNTHROID ) tablet 112 mcg  112 mcg Oral Q0600 Dahal, Chapman, MD       methocarbamol  (ROBAXIN ) tablet 500 mg  500 mg Oral Q6H PRN Arlice Chapman, MD   500 mg at 03/23/24 0406   omega-3 acid ethyl esters (LOVAZA ) capsule 1 g  1 g Oral BID Celinda Alm Lot, MD   1 g at 03/23/24 9044   ondansetron  (ZOFRAN ) tablet 4 mg  4 mg Oral Q6H PRN Celinda Alm Lot, MD       Or   ondansetron  (ZOFRAN ) injection 4 mg  4 mg Intravenous Q6H PRN Celinda Alm Lot, MD   4 mg at 03/23/24 1006   oxybutynin  (DITROPAN ) tablet 5 mg  5 mg Oral Q8H PRN Celinda Alm Lot, MD       oxyCODONE  (OXYCONTIN ) 12 hr tablet 10 mg  10 mg Oral Q12H Dahal, Binaya, MD   10 mg at 03/23/24 0956   pantoprazole  (PROTONIX ) EC tablet 40 mg  40 mg Oral Daily Celinda Alm Lot, MD   40 mg at 03/23/24 0954   polyethylene glycol (MIRALAX  / GLYCOLAX ) packet 17 g  17 g Oral Daily PRN Arlice Chapman, MD       senna-docusate (Senokot-S) tablet 1 tablet  1 tablet Oral QHS Dahal, Binaya, MD   1 tablet at 03/22/24 2104   traMADol  (ULTRAM ) tablet 50 mg  50 mg Oral Q12H PRN Celinda Alm Lot, MD   50 mg at 03/23/24 0407     Discharge Medications: Please see discharge summary for a list of discharge medications.  Relevant Imaging Results:  Relevant Lab Results:   Additional Information SSN: [id number]  Heather DELENA Saltness, LCSW

## 2024-03-23 NOTE — Telephone Encounter (Signed)
 Calling to let our office know he has advance prostate cancer and currently in the hospital. Please advise

## 2024-03-23 NOTE — Telephone Encounter (Signed)
 Dorthea reports pt became very sick on Sunday(birthday).  Went to hospital has sepsis and treating with antibiotics also has advanced Prostate Cancer.  Had an Echo at hospital today.   Dorthea would like to know results of heart monitor.  Advised I do not have her information on DPR so can not release.  Results are on My Chart.   Sent well wishes to [facility] and pt advised if Cardiology care is needed we have staff that can see him in the hospital.  Advised will send message to Katlyn to make her aware.  Will keep 04/14/24 OV for now.

## 2024-03-23 NOTE — TOC Initial Note (Signed)
 Transition of Care [facility]) - Initial/Assessment Note    Patient Details  Name: Connor Hansen MRN: [id number] Date of Birth: 1934-07-29  Transition of Care [facility]) CM/SW Contact:    Connor DELENA Saltness, LCSW Phone Number: 03/23/2024, 2:25 PM  Clinical Narrative:                 Pt recommended for short-term rehab at SNF. CSW spoke with pt's son, Connor Hansen [phone], via phone to discuss recommendation for SNF. Pt and son in agreement with seeking SNF placement. Pt and son report preferred facility of [facility]. CSW advised pt's son of submitting referrals to SNF locations in [place] and surrounding areas. CSW completed FL2 and faxed out referrals, awaiting bed offers. TOC will continue to follow.    Expected Discharge Plan: Skilled Nursing Facility Barriers to Discharge: Continued Medical Work up, SNF Pending bed offer   Patient Goals and CMS Choice Patient states their goals for this hospitalization and ongoing recovery are:: To go to SNF for rehab CMS Medicare.gov Compare Post Acute Care list provided to:: Patient Represenative (must comment) (Pt's son, Connor Hansen) Choice offered to / list presented to : Adult Children [place] ownership interest in [facility].provided to:: Adult Children    Expected Discharge Plan and Services In-house Referral: Clinical Social Work Discharge Planning Services: NA Post Acute Care Choice: Skilled Nursing Facility Living arrangements for the past 2 months: Single Family Home                 DME Arranged: N/A DME Agency: NA       HH Arranged: NA HH Agency: NA        Prior Living Arrangements/Services Living arrangements for the past 2 months: Single Family Home Lives with:: Self Patient language and need for interpreter reviewed:: Yes Do you feel safe going back to the place where you live?: No   Pt lives alone and is high fall risk  Need for Family Participation in Patient Care: Yes (Comment) Care giver  support system in place?: Yes (comment)   Criminal Activity/Legal Involvement Pertinent to Current Situation/Hospitalization: No - Comment as needed  Activities of Daily Living   ADL Screening (condition at time of admission) Independently performs ADLs?: No Does the patient have a NEW difficulty with bathing/dressing/toileting/self-feeding that is expected to last >3 days?: Yes (Initiates electronic notice to provider for possible OT consult) Does the patient have a NEW difficulty with getting in/out of bed, walking, or climbing stairs that is expected to last >3 days?: Yes (Initiates electronic notice to provider for possible PT consult) Does the patient have a NEW difficulty with communication that is expected to last >3 days?: No Is the patient deaf or have difficulty hearing?: No Does the patient have difficulty seeing, even when wearing glasses/contacts?: No Does the patient have difficulty concentrating, remembering, or making decisions?: Yes  Permission Sought/Granted Permission sought to share information with : Family Supports, Oceanographer granted to share information with : Yes, Verbal Permission Granted  Share Information with NAME: Connor Hansen  Permission granted to share info w AGENCY: SNF  Permission granted to share info w Relationship: Son  Permission granted to share info w Contact Information: [phone]  Emotional Assessment Appearance:: Appears stated age Attitude/Demeanor/Rapport: Unable to Assess Affect (typically observed): Unable to Assess Orientation: : Oriented to Self, Oriented to Place, Oriented to Situation, Fluctuating Orientation (Suspected and/or reported Sundowners) Alcohol / Substance Use: Not Applicable Psych Involvement: No (comment)  Admission diagnosis:  UTI (urinary tract infection) due to urinary indwelling catheter (HCC) [U16.488J, N39.0] Anemia, unspecified type [D64.9] Urinary tract infection associated with  indwelling urethral catheter, initial encounter (HCC) [U16.488J, N39.0] Sepsis, due to unspecified organism, unspecified whether acute organ dysfunction present [facility]) [A41.9] Patient Active Problem List   Diagnosis Date Noted   Normocytic anemia 03/22/2024   Cervical disc disease 03/21/2024   Dysthymia 03/21/2024   Gastro-esophageal reflux disease without esophagitis 03/21/2024   Primary gout 03/21/2024   Hypothyroidism 03/21/2024   Long term (current) use of immunomodulator 03/21/2024   Prediabetes 03/21/2024   Pure hypercholesterolemia 03/21/2024   Rheumatoid arthritis (HCC) 03/21/2024   Osteoarthritis 03/21/2024   Stage 3a chronic kidney disease (HCC) 03/21/2024   UTI (urinary tract infection) due to urinary indwelling catheter (HCC) 03/21/2024   AKI (acute kidney injury) (HCC) 03/21/2024   Fever 03/21/2024   Acute encephalopathy 03/21/2024   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   Coronary artery disease due to lipid rich plaque 07/20/2014   Hyperlipidemia 07/20/2013   HTN (hypertension) 07/20/2013   Angina decubitus (HCC) 07/20/2013   Old MI (myocardial infarction) 07/20/2013   PCP:  Connor Other, MD Pharmacy:   [facility] [place], [place] - [address] [facility] [address] [place] [address] Phone: [phone] Fax: [phone]     Social Drivers of Health (SDOH) Social History: SDOH Screenings   Food Insecurity: No Food Insecurity (03/21/2024)  Housing: Low Risk  (03/21/2024)  Transportation Needs: No Transportation Needs (03/21/2024)  Utilities: Not At Risk (03/21/2024)  Social Connections: Unknown (03/21/2024)  Tobacco Use: Medium Risk (03/21/2024)   SDOH Interventions: None indicated     Readmission Risk Interventions     No data to display          Signed: Heather Hansen, MSW, LCSW Clinical Social Worker Inpatient Care Management 03/23/2024 2:28 PM

## 2024-03-23 NOTE — Progress Notes (Signed)
 MD notified of expired telemetry order, MD gave order to renew.

## 2024-03-23 NOTE — Progress Notes (Signed)
 PROGRESS NOTE  Connor Hansen  DOB: [birth year]  PCP: Ransom Other, MD FMW:[id number]  DOA: 03/21/2024  LOS: 2 days  Hospital Day: 3  Brief narrative: Connor Hansen is a 89 y.o. male with PMH significant for HTN, HLD, CAD, CKD, RA, GERD, gout, hypothyroidism, osteoarthritis, rheumatoid arthritis on Enbrel and weekly methotrexate, recently diagnosed with prostate cancer, has indwelling Foley catheter. 8/10, patient presented to the ED with complaint of generalized weakness, fever, decreased appetite.  He was recently started on a course of Bactrim by urologist for UTI.  Despite being on antibiotic for 2 days, weakness continued.  He was found on the floor confused by his son and brought to the ED.  Lives at home alone.  In the ED, patient was initially afebrile, blood pressure in the low normal range. In few hours he mounted a fever of 101.2 Labs showed WC count 8.8, hemoglobin 7.7, platelet 102, lactic acid normal, sodium 130, serum bicarb 18, BUN/creatinine 38/2.69 Urinalysis showed cloudy yellow urine with large leukocytes, rare bacteria, negative nitrite CT abdomen pelvis did not show any obstructive ureteral calculi but showed stable moderate bilateral hydroureteronephrosis and stable mildly enlarged prostate, stable diffuse sclerotic bone metastases. Urine culture and blood culture were sent.  Patient was started on IV Rocephin , IV fluid Admitted to TRH  Subjective: Patient was seen and examined this morning.   Propped up in bed.  Son at bedside. Not in distress.  Was seen by PT yesterday.  Noted significant limitation due to pain. Discussed about long-term pain management strategy.  Assessment and plan: UTI associated with indwelling Foley catheter  E. coli UTI and bacteremia Presented with fever, weakness Urine culture and blood preliminary culture showing E. Coli Currently on IV Rocephin .  Discussed with pharmacy.  Switched to oral Keflex  today. WBC count and lactic acid  level normal No fever in the last 24 hours. Recent Labs  Lab 03/21/24 0924 03/21/24 0938 03/21/24 1140 03/22/24 0521 03/23/24 0504  WBC 8.8  --   --  8.2 6.4  LATICACIDVEN  --  1.0 1.2  --   --    Metastatic prostate cancer Bilateral hydronephrosis Has indwelling Foley catheter.   Per CT scan, bilateral hydronephrosis and diffuse bone mets are stable. Per daughter-in-law at bedside, urologist planned to maintain Foley catheter for foreseeable future. For pain control, I have started the patient on Pain regimen --- Scheduled: Tylenol  1 g 3 times daily, OxyContin  10 mg twice daily --- PRN: Given significant renal impairment, switch from tramadol  to Roxicodone  as needed  AKI on CKD 3b Acute metabolic acidosis Baseline creatinine 1.89 from July 2025.  Patient with creatinine elevated to 2.69 and bicarb low at 18 Creatinine remains elevated but I would like to avoid further hydration due to low EF. Continue to monitor Recent Labs    03/02/24 1620 03/21/24 0924 03/22/24 0521 03/23/24 0504  BUN 18 38* 43* 41*  CREATININE 1.89* 2.69* 2.72* 2.62*  CO2 24 18* 18* 20*   Acute metabolic encephalopathy Was confused, altered at presentation secondary to UTI.   Mental status is gradually improving. Continue to monitor  H/o systolic CHF  Hypertension PTA meds- carvedilol  3.125 mg twice daily, doxazosin  2 mg nightly, amlodipine 10 mg daily, valsartan 160 mg daily, Lasix  as needed Currently only continued on carvedilol .  Blood pressure in low normal range and improving.  Resume other medicines as possible. Most recent echo from 2020 with EF 40 to 45%.  Repeat echo pending today.  H/o CAD  HLD Continue Coreg , aspirin , statin   Rheumatoid arthritis Continue leflunomide  daily and methotrexate weekly  Primary gout Continue allopurinol  100 mg p.o. daily  Hypothyroidism Continue levothyroxine  112 mcg p.o. daily.  Per patient's son, it was recently increased from 75 mcg daily.    Chronic normocytic anemia  Hemoglobin at 8.1 today.  No active bleeding.  Continue to monitor Recent Labs    03/02/24 1620 03/21/24 0924 03/22/24 0521 03/23/24 0504  HGB 9.4* 7.7* 8.7* 8.1*  MCV 93.6 91.3 95.5 91.3     Impaired mobility:  PT eval obtained.  Recommended Skilled Nursing-Short Term Rehab (<3 Hours/Day) (Unless Family Availble 14/7)03/22/2024 1540  Goals of care   Code Status: Full Code     DVT prophylaxis:  SCDs Start: 03/21/24 1311   Antimicrobials: IV Rocephin  Fluid: NS at 75 mL/h Consultants: None Family Communication: Daughter-in-law at bedside  Status: Inpatient Level of care:  Telemetry   Patient is from: Home Needs to continue in-hospital care: Needs IV antibiotics, renal function monitoring, blood pressure monitoring Anticipated d/c to: SNF recommended by PT.    Diet:  Diet Order             Diet Heart Room service appropriate? Yes; Fluid consistency: Thin  Diet effective now                   Scheduled Meds:  acetaminophen   1,000 mg Oral TID   allopurinol   100 mg Oral QPM   aspirin   81 mg Oral QPM   atorvastatin   80 mg Oral QPM   carvedilol   3.125 mg Oral BID   [START ON 03/24/2024] cephALEXin   1,000 mg Oral Q12H   Chlorhexidine  Gluconate Cloth  6 each Topical Daily   famotidine   20 mg Oral QHS   feeding supplement  237 mL Oral BID BM   ferrous sulfate   325 mg Oral Daily   folic acid   2 mg Oral BID   leflunomide   20 mg Oral Daily   [START ON 03/24/2024] levothyroxine   112 mcg Oral Q0600   omega-3 acid ethyl esters  1 g Oral BID   oxyCODONE   10 mg Oral Q12H   pantoprazole   40 mg Oral Daily   senna-docusate  1 tablet Oral QHS    PRN meds: methocarbamol , ondansetron  **OR** ondansetron  (ZOFRAN ) IV, oxybutynin , oxyCODONE , polyethylene glycol   Infusions:     Antimicrobials: Anti-infectives (From admission, onward)    Start     Dose/Rate Route Frequency Ordered Stop   03/24/24 1000  cephALEXin  (KEFLEX ) capsule 1,000 mg         1,000 mg Oral Every 12 hours 03/23/24 1139 03/29/24 0959   03/22/24 1000  cefTRIAXone  (ROCEPHIN ) 2 g in sodium chloride  0.9 % 100 mL IVPB  Status:  Discontinued        2 g 200 mL/hr over 30 Minutes Intravenous Every 24 hours 03/22/24 0427 03/23/24 1139   03/21/24 2000  piperacillin -tazobactam (ZOSYN ) IVPB 3.375 g  Status:  Discontinued        3.375 g 12.5 mL/hr over 240 Minutes Intravenous Every 12 hours 03/21/24 1721 03/22/24 0427   03/21/24 1800  piperacillin -tazobactam (ZOSYN ) IVPB 2.25 g  Status:  Discontinued        2.25 g 100 mL/hr over 30 Minutes Intravenous Every 8 hours 03/21/24 1032 03/21/24 1719   03/21/24 1030  piperacillin -tazobactam (ZOSYN ) IVPB 3.375 g        3.375 g 100 mL/hr over 30 Minutes Intravenous  Once 03/21/24 1020 03/21/24 1123  Objective: Vitals:   03/23/24 0518 03/23/24 1045  BP: (!) 112/53 (!) 140/73  Pulse: (!) 47 68  Resp: 18   Temp:    SpO2:      Intake/Output Summary (Last 24 hours) at 03/23/2024 1500 Last data filed at 03/23/2024 1100 Gross per 24 hour  Intake 655 ml  Output 1250 ml  Net -595 ml   Filed Weights   03/21/24 0835 03/21/24 1324  Weight: 79.8 kg 80.4 kg   Weight change:  Body mass index is 26.18 kg/m.   Physical Exam: General exam: Pleasant, elderly Caucasian male.  No pain at rest but significant pain on ambulation. Skin: No rashes, lesions or ulcers. HEENT: Atraumatic, normocephalic, no obvious bleeding Lungs: Clear to auscultation bilaterally,  CVS: S1, S2, mild systolic ejection murmur,   GI/Abd: Soft, nontender, nondistended, bowel sound present,   CNS: Alert, awake, slow to respond, oriented to place and person Psychiatry: Sad affect Extremities: No pedal edema, no calf tenderness,   Data Review: I have personally reviewed the laboratory data and studies available.  F/u labs ordered Unresulted Labs (From admission, onward)     Start     Ordered   Unscheduled  CBC with Differential/Platelet  Daily,    R     Question:  Specimen collection method  Answer:  Lab=Lab collect   03/23/24 1500   Unscheduled  Basic metabolic panel with GFR  Daily,   R     Question:  Specimen collection method  Answer:  Lab=Lab collect   03/23/24 1500            Signed, Chapman Rota, MD [facility] 03/23/2024

## 2024-03-24 DIAGNOSIS — D649 Anemia, unspecified: Secondary | ICD-10-CM | POA: Diagnosis not present

## 2024-03-24 DIAGNOSIS — N390 Urinary tract infection, site not specified: Secondary | ICD-10-CM | POA: Diagnosis not present

## 2024-03-24 DIAGNOSIS — G893 Neoplasm related pain (acute) (chronic): Secondary | ICD-10-CM

## 2024-03-24 DIAGNOSIS — N179 Acute kidney failure, unspecified: Secondary | ICD-10-CM | POA: Diagnosis not present

## 2024-03-24 DIAGNOSIS — T83511A Infection and inflammatory reaction due to indwelling urethral catheter, initial encounter: Secondary | ICD-10-CM | POA: Diagnosis not present

## 2024-03-24 DIAGNOSIS — C7951 Secondary malignant neoplasm of bone: Secondary | ICD-10-CM

## 2024-03-24 DIAGNOSIS — G9340 Encephalopathy, unspecified: Secondary | ICD-10-CM

## 2024-03-24 DIAGNOSIS — C61 Malignant neoplasm of prostate: Secondary | ICD-10-CM

## 2024-03-24 DIAGNOSIS — Z7189 Other specified counseling: Secondary | ICD-10-CM

## 2024-03-24 DIAGNOSIS — Z515 Encounter for palliative care: Principal | ICD-10-CM

## 2024-03-24 LAB — BASIC METABOLIC PANEL WITH GFR
Anion gap: 8 (ref 5–15)
BUN: 37 mg/dL — ABNORMAL HIGH (ref 8–23)
CO2: 20 mmol/L — ABNORMAL LOW (ref 22–32)
Calcium: 8 mg/dL — ABNORMAL LOW (ref 8.9–10.3)
Chloride: 107 mmol/L (ref 98–111)
Creatinine, Ser: 2.34 mg/dL — ABNORMAL HIGH (ref 0.61–1.24)
GFR, Estimated: 26 mL/min — ABNORMAL LOW (ref >60)
Glucose, Bld: 95 mg/dL (ref 70–99)
Potassium: 3.9 mmol/L (ref 3.5–5.1)
Sodium: 135 mmol/L (ref 135–145)

## 2024-03-24 LAB — CBC WITH DIFFERENTIAL/PLATELET
Abs Immature Granulocytes: 0.05 K/uL (ref 0.00–0.07)
Basophils Absolute: 0 K/uL (ref 0.0–0.1)
Basophils Relative: 1 %
Eosinophils Absolute: 0.4 K/uL (ref 0.0–0.5)
Eosinophils Relative: 5 %
HCT: 26.1 % — ABNORMAL LOW (ref 39.0–52.0)
Hemoglobin: 8.4 g/dL — ABNORMAL LOW (ref 13.0–17.0)
Immature Granulocytes: 1 %
Lymphocytes Relative: 11 %
Lymphs Abs: 0.8 K/uL (ref 0.7–4.0)
MCH: 29.2 pg (ref 26.0–34.0)
MCHC: 32.2 g/dL (ref 30.0–36.0)
MCV: 90.6 fL (ref 80.0–100.0)
Monocytes Absolute: 0.8 K/uL (ref 0.1–1.0)
Monocytes Relative: 11 %
Neutro Abs: 5.5 K/uL (ref 1.7–7.7)
Neutrophils Relative %: 71 %
Platelets: 161 K/uL (ref 150–400)
RBC: 2.88 MIL/uL — ABNORMAL LOW (ref 4.22–5.81)
RDW: 17.2 % — ABNORMAL HIGH (ref 11.5–15.5)
WBC: 7.6 K/uL (ref 4.0–10.5)
nRBC: 0 % (ref 0.0–0.2)

## 2024-03-24 LAB — CULTURE, BLOOD (ROUTINE X 2)

## 2024-03-24 NOTE — Progress Notes (Signed)
 New 16 Fr Coude catheter placed. Order to exchange coude catheter present. Urine return noted, patient tolerated exchange of catheter.

## 2024-03-24 NOTE — Progress Notes (Signed)
 PT Cancellation Note  Patient Details Name: CHANZ CAHALL MRN: [id number] DOB: 21-Jun-1934   Cancelled Treatment:    Reason Eval/Treat Not Completed: Other (comment). PT returned 1457 s/p catheter exchange. Pt declined to participate with therapy reporting he would sure like to rest.  PT to continue to follow acutely.  [place], PT Acute Rehab   [place] VEAR Drone 03/24/2024, 3:20 PM

## 2024-03-24 NOTE — Consult Note (Signed)
 Consultation Note Date: 03/24/2024   Patient Name: Connor Hansen  DOB: [birth year]  MRN: [id number]  Age / Sex: 89 y.o., male   PCP: Ransom Other, MD Referring Physician: Verdene Purchase, MD  Reason for Consultation: Establishing goals of care and symptom management     Chief Complaint/History of Present Illness:   Patient is an 89 year old male with a past medical history of hypertension, hyperlipidemia, CAD, CKD, rheumatoid arthritis on Enbrel and weekly methotrexate, GERD, gout, hypothyroidism, osteoarthritis, and recently diagnosed metastatic prostate cancer with indwelling Foley catheter who was admitted on 03/21/2024 for management after being found on the floor by his son and was noted to be confused.  During hospitalization patient has received management for urinary tract infection.  Patient also noted to have bilateral hydronephrosis with AKI on CKD.  Palliative medicine team consulted to assist with complex medical decision making and symptom management.  Reviewed EMR including recent documentation from hospitalist, TOC, and PT/OT.  Currently planning for patient to go to rehab at [facility].  Upon review of EMR, patient noted to have MOST on file from 11/01/2020 which stated attempt resuscitation, full scope of treatment, antibiotics if indicated, and IV fluids if indicated.  No choice was made regarding feeding tube use on this form.  Review of recent BMP noting BUN 37, creatinine 2.34, and send GFR 26. At time of EMR review in past 24 hours patient has been receiving Tylenol  1000 mg 3 times daily, OxyContin  10 mg every 12 hours scheduled started 03/22/2024, oral Robaxin  500 mg every 6 hours as needed x 1 dose, and oral oxycodone  5 mg every 6 hours as needed x 0 doses.   Presented to bedside to meet with patient.  Patient laying comfortably in bed.  Patient's son, Dale, present at bedside.  With permission able to introduce myself and the role of the palliative medicine team in patient's  medical journey.  Patient having difficulty recalling at times so son was able to provide history as well.  Up until a week ago patient had been living independently at home.  Patient would spend half of his time here and half of his time in [place]  with his wife.  Son would assist in transporting patient back and forth. Patient hoping to regain his strength to return to his prior independence level.  Discussed pain management at this time.  Patient denies having pain at this time.  Patient states would occasionally have pain in hip and backside though feels that this is partially because he needs to get up and out of bed more.  Discussed patient's current regimen of OxyContin  10 mg every 12 hours scheduled and Tylenol  1000 mg 3 times daily.  Discussed patient has as needed oxycodone  if needed particularly to help with pain associated with any physical therapy/OT so that pain does not limit his rehab functioning.  Patient had previously been on tramadol  at home though discussed worrisome adverse effects that could occur with this medication in geriatric population and those with renal impairment.  Patient and son acknowledged this.  They are agreeing with continuing current pain regimen.  Inquired about other symptoms.  Patient noted issue with constipation prior to this morning.  Patient noted he had not had a bowel movement for at least a week prior to today.  Patient noted large bowel movement this morning.  Discussed importance of regular bowel movements especially while taking opioids.  Patient and son agreeing to start senna daily for management.  Can adjust accordingly based on  symptom burden.  During conversation patient again confirmed that he is legally married though his wife lives in [place] .  Patient's only living child is his son here as his daughter has passed away.  As patient was becoming more overwhelmed during conversation, did not delve into topic regarding advance care planning  documentation and CODE STATUS further.  Noted would continue conversations as able moving forward.  Patient and son did express that patient's quality of life moving forward is incredibly important to him.  Noted importance of continued conversations with providers moving forward including continued involvement of palliative medicine.  Patient and son agreeing with this.  They had further questions regarding Foley exchange during hospitalization and planning with rehab and discharge.  Noted would inform hospitalist of these questions. Provided emotional support via active listening.  Palliative medicine team to continue following along with patient's medical journey.  Discussed care with hospitalist, RN, TOC, and PT regarding patient's concerns questions so could be appropriate follow-up.  Primary Diagnoses  Present on Admission:  UTI (urinary tract infection) due to urinary indwelling catheter (HCC)  HTN (hypertension)  Hyperlipidemia  Hypothyroidism  Coronary artery disease due to lipid rich plaque  Prediabetes  Rheumatoid arthritis (HCC)  Stage 3a chronic kidney disease (HCC)  AKI (acute kidney injury) (HCC)  Primary gout  Fever  Acute encephalopathy  Normocytic anemia   Past Medical History:  Diagnosis Date   CAD (coronary artery disease)    2/10 - BMS to mid LAD, RCA. Anteroapical akinesis - normal EF. 2010   CKD (chronic kidney disease)    Coronary atherosclerosis of native coronary artery    GERD (gastroesophageal reflux disease)    HTN (hypertension)    Hyperlipidemia    Hypothyroidism    Old myocardial infarct    RA (rheumatoid arthritis) (HCC)    Social History   Socioeconomic History   Marital status: Widowed    Spouse name: Not on file   Number of children: Not on file   Years of education: Not on file   Highest education level: Not on file  Occupational History   Not on file  Tobacco Use   Smoking status: Former    Types: Cigarettes   Smokeless tobacco:  Never  Vaping Use   Vaping status: Never Used  Substance and Sexual Activity   Alcohol use: Not Currently   Drug use: No   Sexual activity: Not Currently  Other Topics Concern   Not on file  Social History Narrative   Not on file   Social Drivers of Health   Financial Resource Strain: Not on file  Food Insecurity: No Food Insecurity (03/21/2024)   Hunger Vital Sign    Worried About Running Out of Food in the Last Year: Never true    Ran Out of Food in the Last Year: Never true  Transportation Needs: No Transportation Needs (03/21/2024)   PRAPARE - Administrator, Civil Service (Medical): No    Lack of Transportation (Non-Medical): No  Physical Activity: Not on file  Stress: Not on file  Social Connections: Unknown (03/21/2024)   Social Connection and Isolation Panel    Frequency of Communication with Friends and Family: Twice a week    Frequency of Social Gatherings with Friends and Family: Twice a week    Attends Religious Services: 1 to 4 times per year    Active Member of [employer] or Organizations: Yes    Attends Banker Meetings: 1 to 4 times per year  Marital Status: Patient declined   Family History  Problem Relation Age of Onset   Cancer Father    Scheduled Meds:  acetaminophen   1,000 mg Oral TID   allopurinol   100 mg Oral QPM   aspirin   81 mg Oral QPM   atorvastatin   80 mg Oral QPM   carvedilol   3.125 mg Oral BID   cephALEXin   1,000 mg Oral Q12H   Chlorhexidine  Gluconate Cloth  6 each Topical Daily   famotidine   20 mg Oral QHS   feeding supplement  237 mL Oral BID BM   ferrous sulfate   325 mg Oral Daily   folic acid   2 mg Oral BID   leflunomide   20 mg Oral Daily   levothyroxine   112 mcg Oral Q0600   omega-3 acid ethyl esters  1 g Oral BID   oxyCODONE   10 mg Oral Q12H   pantoprazole   40 mg Oral Daily   senna-docusate  1 tablet Oral QHS   Continuous Infusions: PRN Meds:.methocarbamol , ondansetron  **OR** ondansetron  (ZOFRAN ) IV,  oxybutynin , oxyCODONE , polyethylene glycol No Known Allergies CBC:    Component Value Date/Time   WBC 7.6 03/24/2024 0526   HGB 8.4 (L) 03/24/2024 0526   HCT 26.1 (L) 03/24/2024 0526   PLT 161 03/24/2024 0526   MCV 90.6 03/24/2024 0526   NEUTROABS 5.5 03/24/2024 0526   LYMPHSABS 0.8 03/24/2024 0526   MONOABS 0.8 03/24/2024 0526   EOSABS 0.4 03/24/2024 0526   BASOSABS 0.0 03/24/2024 0526   Comprehensive Metabolic Panel:    Component Value Date/Time   NA 135 03/24/2024 0526   NA 142 08/25/2019 1400   K 3.9 03/24/2024 0526   CL 107 03/24/2024 0526   CO2 20 (L) 03/24/2024 0526   BUN 37 (H) 03/24/2024 0526   BUN 14 08/25/2019 1400   CREATININE 2.34 (H) 03/24/2024 0526   GLUCOSE 95 03/24/2024 0526   CALCIUM  8.0 (L) 03/24/2024 0526   AST 57 (H) 03/22/2024 0521   ALT 27 03/22/2024 0521   ALKPHOS 174 (H) 03/22/2024 0521   BILITOT 0.8 03/22/2024 0521   PROT 5.4 (L) 03/22/2024 0521   ALBUMIN  2.7 (L) 03/22/2024 0521    Physical Exam: Vital Signs: BP (!) 144/61 (BP Location: Left Arm)   Pulse 65   Temp 98.4 F (36.9 C)   Resp 16   Ht 5' 9 (1.753 m)   Wt 80.4 kg   SpO2 95%   BMI 26.18 kg/m  SpO2: SpO2: 95 % O2 Device: O2 Device: Room Air O2 Flow Rate:   Intake/output summary:  Intake/Output Summary (Last 24 hours) at 03/24/2024 1021 Last data filed at 03/24/2024 0542 Gross per 24 hour  Intake 600 ml  Output 2250 ml  Net -1650 ml   LBM:   Baseline Weight: Weight: 79.8 kg Most recent weight: Weight: 80.4 kg  General: NAD, awake, interacting though can become easily confused Cardiovascular: RRR Respiratory: no increased work of breathing noted, not in respiratory distress Neuro: Awake, interactive, easily confused at times          Palliative Performance Scale: 50%              Additional Data Reviewed: Recent Labs    03/23/24 0504 03/24/24 0526  WBC 6.4 7.6  HGB 8.1* 8.4*  PLT 120* 161  NA 134* 135  BUN 41* 37*  CREATININE 2.62* 2.34*     Imaging: ECHOCARDIOGRAM COMPLETE    ECHOCARDIOGRAM REPORT       Patient Name:   RAFEAL  FORBES MAFFUCCI Date of Exam: 03/23/2024 Medical Rec #:  [id number]      Height:       69.0 in Accession #:    [id number]     Weight:       177.2 lb Date of Birth:  1934-01-17      BSA:          1.963 m Patient Age:    89 years       BP:           140/73 mmHg Patient Gender: M              HR:           63 bpm. Exam Location:  Inpatient  Procedure: 2D Echo, 3D Echo, Cardiac Doppler and Color Doppler (Both Spectral            and Color Flow Doppler were utilized during procedure).  Indications:    Abnormal ECG   History:        Patient has prior history of Echocardiogram examinations, most                 recent 04/27/2019. CAD; Risk Factors:Dyslipidemia and                 Hypertension. CKD Stage 3.   Sonographer:    Philomena Daring Referring Phys: [id number] BINAYA DAHAL  IMPRESSIONS   1. Left ventricular ejection fraction, by estimation, is 40 to 45%. The left ventricle has mildly decreased function. The left ventricle demonstrates regional wall motion abnormalities (see scoring diagram/findings for description). Left ventricular  diastolic parameters were normal.  2. Right ventricular systolic function is normal. The right ventricular size is normal. There is normal pulmonary artery systolic pressure. The estimated right ventricular systolic pressure is 15.4 mmHg.  3. The mitral valve is grossly normal. Trivial mitral valve regurgitation. No evidence of mitral stenosis.  4. The aortic valve is tricuspid. There is mild calcification of the aortic valve. Aortic valve regurgitation is not visualized. Aortic valve sclerosis/calcification is present, without any evidence of aortic stenosis.  5. The inferior vena cava is normal in size with greater than 50% respiratory variability, suggesting right atrial pressure of 3 mmHg.  Comparison(s): No significant change from prior  study.  Conclusion(s)/Recommendation(s): Findings consistent with ischemic cardiomyopathy.  FINDINGS  Left Ventricle: Left ventricular ejection fraction, by estimation, is 40 to 45%. The left ventricle has mildly decreased function. The left ventricle demonstrates regional wall motion abnormalities. The left ventricular internal cavity size was normal  in size. There is no left ventricular hypertrophy. Left ventricular diastolic parameters were normal.    LV Wall Scoring: The mid and distal anterior septum, apical anterior segment, apical inferior segment, and apex are akinetic.  Right Ventricle: The right ventricular size is normal. No increase in right ventricular wall thickness. Right ventricular systolic function is normal. There is normal pulmonary artery systolic pressure. The tricuspid regurgitant velocity is 1.76 m/s, and  with an assumed right atrial pressure of 3 mmHg, the estimated right ventricular systolic pressure is 15.4 mmHg.  Left Atrium: Left atrial size was normal in size.  Right Atrium: Right atrial size was normal in size.  Pericardium: Trivial pericardial effusion is present.  Mitral Valve: The mitral valve is grossly normal. Trivial mitral valve regurgitation. No evidence of mitral valve stenosis.  Tricuspid Valve: The tricuspid valve is grossly normal. Tricuspid valve regurgitation is trivial. No evidence of tricuspid stenosis.  Aortic Valve: The aortic valve is  tricuspid. There is mild calcification of the aortic valve. Aortic valve regurgitation is not visualized. Aortic valve sclerosis/calcification is present, without any evidence of aortic stenosis. Aortic valve mean  gradient measures 7.5 mmHg. Aortic valve peak gradient measures 13.8 mmHg. Aortic valve area, by VTI measures 2.19 cm.  Pulmonic Valve: The pulmonic valve was grossly normal. Pulmonic valve regurgitation is not visualized. No evidence of pulmonic stenosis.  Aorta: The aortic root and  ascending aorta are structurally normal, with no evidence of dilitation.  Venous: The inferior vena cava is normal in size with greater than 50% respiratory variability, suggesting right atrial pressure of 3 mmHg.  IAS/Shunts: The atrial septum is grossly normal.    LEFT VENTRICLE PLAX 2D LVIDd:         4.70 cm     Diastology LVIDs:         3.30 cm     LV e' medial:    8.59 cm/s LV PW:         1.00 cm     LV E/e' medial:  11.8 LV IVS:        1.00 cm     LV e' lateral:   10.70 cm/s LVOT diam:     2.10 cm     LV E/e' lateral: 9.4 LV SV:         91 LV SV Index:   46 LVOT Area:     3.46 cm   LV Volumes (MOD) LV vol d, MOD A2C: 91.0 ml LV vol d, MOD A4C: 99.9 ml LV vol s, MOD A2C: 29.9 ml LV vol s, MOD A4C: 34.5 ml LV SV MOD A2C:     61.1 ml LV SV MOD A4C:     99.9 ml LV SV MOD BP:      65.0 ml  RIGHT VENTRICLE             IVC RV S prime:     13.70 cm/s  IVC diam: 1.60 cm TAPSE (M-mode): 2.3 cm  LEFT ATRIUM           Index        RIGHT ATRIUM           Index LA diam:      3.40 cm 1.73 cm/m   RA Area:     11.10 cm LA Vol (A2C): 48.6 ml 24.76 ml/m  RA Volume:   20.40 ml  10.39 ml/m LA Vol (A4C): 49.0 ml 24.97 ml/m  AORTIC VALVE AV Area (Vmax):    2.25 cm AV Area (Vmean):   2.24 cm AV Area (VTI):     2.19 cm AV Vmax:           186.00 cm/s AV Vmean:          127.000 cm/s AV VTI:            0.415 m AV Peak Grad:      13.8 mmHg AV Mean Grad:      7.5 mmHg LVOT Vmax:         121.00 cm/s LVOT Vmean:        82.000 cm/s LVOT VTI:          0.262 m LVOT/AV VTI ratio: 0.63   AORTA Ao Root diam: 2.50 cm Ao Asc diam:  3.30 cm  MITRAL VALVE                TRICUSPID VALVE MV Area (PHT): 3.74 cm     TR Peak grad:  12.4 mmHg MV Decel Time: 203 msec     TR Vmax:        176.00 cm/s MV E velocity: 101.00 cm/s MV A velocity: 117.00 cm/s  SHUNTS MV E/A ratio:  0.86         Systemic VTI:  0.26 m                             Systemic Diam: 2.10 cm  Darryle Decent  MD Electronically signed by Darryle Decent MD Signature Date/Time: 03/23/2024/3:10:51 PM      Final      I personally reviewed recent imaging.   Palliative Care Assessment and Plan Summary of Established Goals of Care and Medical Treatment Preferences   Patient is an 89 year old male with a past medical history of hypertension, hyperlipidemia, CAD, CKD, rheumatoid arthritis on Enbrel and weekly methotrexate, GERD, gout, hypothyroidism, osteoarthritis, and recently diagnosed metastatic prostate cancer with indwelling Foley catheter who was admitted on 03/21/2024 for management after being found on the floor by his son and was noted to be confused.  During hospitalization patient has received management for urinary tract infection.  Patient also noted to have bilateral hydronephrosis with AKI on CKD.  Palliative medicine team consulted to assist with complex medical decision making and symptom management.  # Complex medical decision making/goals of care  - Discussed care with patient while son, Dale, at bedside as detailed above in HPI.  Patient was confused at times and easily overwhelmed discussing symptom management today so did not delve into advance care planning.  Patient did note he has a wife who lives in [place]  and only living child, Dasie, lives here in [place].  Wife is not listed in the EMR so would recommend further discussions about HCPOA as able.  Of note patient does have MOST form on file from 2023 electing for full medical interventions including full code.  Palliative medicine team will continue to engage in conversations as able and appropriate.  -  Code Status: Full Code   # Symptom management Patient is receiving these palliative interventions for symptom management with an intent to improve quality of life.   - Pain, in setting of metastatic prostate cancer with underlying RA   - Continue OxyContin  10 mg every 12 hours during the day which was already started on  03/22/2024   - Continue oxycodone  5 mg every 6 hours as needed.  Continue to adjust based on patient's symptom burden.   - Do not restart tramadol  due to adverse effects in geriatric population and would patient's underlying renal dysfunction   - Constipation   - Continue senna 1 tab daily at bedtime   - Continue MiraLAX  17 g daily as needed  # Psycho-social/Spiritual Support:  - Support System: Son, wife in [place]   # Discharge Planning:  Skilled Nursing Facility for rehab with Palliative care service follow-up - Placed referral to PMT at [facility] for follow-up in outpatient setting.  Thank you for allowing the palliative care team to participate in the care [name].  Tinnie Radar, DO Palliative Care Provider PMT # [phone]  If patient remains symptomatic despite maximum doses, please call PMT at [phone] between 0700 and 1900. Outside of these hours, please call attending, as PMT does not have night coverage.  Personally spent 80 minutes in patient care including extensive chart review (labs, imaging, progress/consult notes, vital signs), medically appropraite exam, discussed with treatment team, education to patient, family,  and staff, documenting clinical information, medication review and management, coordination of care, and available advanced directive documents.

## 2024-03-24 NOTE — Progress Notes (Signed)
 Heart Failure Navigator Progress Note  Assessed for Heart & Vascular TOC clinic readiness.  Patient does not meet criteria due to per MD note patient with history of Metastatic cancer. No HF TOC. .   Navigator will sign off at this time.  Randie Bustle, BSN, Scientist, clinical (histocompatibility and immunogenetics) Only

## 2024-03-24 NOTE — Progress Notes (Signed)
 PT Cancellation Note  Patient Details Name: Connor Hansen MRN: [id number] DOB: July 31, 1934   Cancelled Treatment:    Reason Eval/Treat Not Completed: Other (comment). Pt declined therapy reporting he was not going to get OOB until the catheter was changed. PT provided pt and family ed on unclear as to specific time urology would be arriving to change foley and that pt could stay close to the room. Pt continued to decline. PT to continue to follow acutely.   [place], PT Acute Rehab   [place] VEAR Drone 03/24/2024, 2:18 PM

## 2024-03-24 NOTE — Progress Notes (Signed)
 PROGRESS NOTE  Connor Hansen  DOB: [birth year]  PCP: Ransom Other, MD FMW:[id number]  DOA: 03/21/2024  LOS: 3 days  Hospital Day: 4  Brief narrative: MITCHEAL SWEETIN is a 89 y.o. male with PMH significant for HTN, HLD, CAD, CKD, RA, GERD, gout, hypothyroidism, osteoarthritis, rheumatoid arthritis on Enbrel and weekly methotrexate, recently diagnosed with prostate cancer, has indwelling Foley catheter. 8/10, patient presented to the ED with complaint of generalized weakness, fever, decreased appetite.  He was recently started on a course of Bactrim by urologist for UTI.  Despite being on antibiotic for 2 days, weakness continued.  He was found on the floor confused by his son and brought to the ED.  Lives at home alone.  In the ED, patient was initially afebrile, blood pressure in the low normal range. In few hours he mounted a fever of 101.2 Labs showed WC count 8.8, hemoglobin 7.7, platelet 102, lactic acid normal, sodium 130, serum bicarb 18, BUN/creatinine 38/2.69 Urinalysis showed cloudy yellow urine with large leukocytes, rare bacteria, negative nitrite CT abdomen pelvis did not show any obstructive ureteral calculi but showed stable moderate bilateral hydroureteronephrosis and stable mildly enlarged prostate, stable diffuse sclerotic bone metastases. Urine culture and blood culture were sent.  Patient was started on IV Rocephin , IV fluid Admitted to [facility]  Subjective: Patient mildly distracted this morning.  Does not appear to be in any discomfort or distress.  Denies any pain issues.  Assessment and plan: UTI associated with indwelling Foley catheter  E. coli UTI and bacteremia Presented with fever, weakness Urine culture and blood preliminary culture showing E. Coli Patient was initially treated with ceftriaxone  and was changed over to cephalexin .  Remains afebrile with normal WBC. Lactic acid level was normal as well.  Metastatic prostate cancer Bilateral  hydronephrosis Has indwelling Foley catheter.   Per CT scan, bilateral hydronephrosis and diffuse bone mets are stable. Per family, urologist planned to maintain Foley catheter for foreseeable future. Patient is noted to be on scheduled Tylenol  and oxycodone  for pain control.  AKI on CKD 3b Acute metabolic acidosis Baseline creatinine 1.89 from July 2025.   Creatinine has improved to 2.34.  Monitor urine output.  Avoid further hydration due to history of CHF.  Acute metabolic encephalopathy Was confused, altered at presentation secondary to UTI.   Mental status is gradually improving. Stable for the most part.  chronic systolic CHF  Essential hypertension PTA meds- carvedilol  3.125 mg twice daily, doxazosin  2 mg nightly, amlodipine 10 mg daily, valsartan 160 mg daily, Lasix  as needed Currently only continued on carvedilol .   Echocardiogram showed stable LVEF of 40 to 45%. No evidence for volume overload currently.  Holding ARB and diuretics due to AKI.  H/o CAD HLD Continue Coreg , aspirin , statin   Rheumatoid arthritis Continue leflunomide  daily and methotrexate weekly  Primary gout Continue allopurinol  100 mg p.o. daily  Hypothyroidism Continue levothyroxine  112 mcg p.o. daily.  Per patient's son, it was recently increased from 75 mcg daily.   Chronic normocytic anemia  Stable hemoglobin without any evidence of overt bleeding.   Impaired mobility:  PT eval obtained.  Recommended Skilled Nursing-Short Term Rehab (<3 Hours/Day) (Unless Family Availble 14/7)03/22/2024 1540  CODE STATUS: Full code  DVT prophylaxis: SCDs Start: 03/21/24 1311 Family Communication: No family at bedside currently Disposition: SNF.  TOC is following.     Scheduled Meds:  acetaminophen   1,000 mg Oral TID   allopurinol   100 mg Oral QPM   aspirin   81 mg  Oral QPM   atorvastatin   80 mg Oral QPM   carvedilol   3.125 mg Oral BID   cephALEXin   1,000 mg Oral Q12H   Chlorhexidine  Gluconate  Cloth  6 each Topical Daily   famotidine   20 mg Oral QHS   feeding supplement  237 mL Oral BID BM   ferrous sulfate   325 mg Oral Daily   folic acid   2 mg Oral BID   leflunomide   20 mg Oral Daily   levothyroxine   112 mcg Oral Q0600   omega-3 acid ethyl esters  1 g Oral BID   oxyCODONE   10 mg Oral Q12H   pantoprazole   40 mg Oral Daily   senna-docusate  1 tablet Oral QHS    PRN meds: methocarbamol , ondansetron  **OR** ondansetron  (ZOFRAN ) IV, oxybutynin , oxyCODONE , polyethylene glycol   Infusions:    Objective: Vitals:   03/23/24 2115 03/24/24 0539  BP: (!) 115/53 (!) 144/61  Pulse: 63 65  Resp:  16  Temp:  98.4 F (36.9 C)  SpO2:  95%    Intake/Output Summary (Last 24 hours) at 03/24/2024 1123 Last data filed at 03/24/2024 1055 Gross per 24 hour  Intake 600 ml  Output 2300 ml  Net -1700 ml   Filed Weights   03/21/24 0835 03/21/24 1324  Weight: 79.8 kg 80.4 kg   Weight change:  Body mass index is 26.18 kg/m.   Physical Exam:  General appearance: Awake alert.  In no distress.  Mildly distracted Resp: Clear to auscultation bilaterally.  Normal effort Cardio: S1-S2 is normal regular.  No S3-S4.  No rubs murmurs or bruit GI: Abdomen is soft.  Nontender nondistended.  Bowel sounds are present normal.  No masses organomegaly Extremities: No edema.  Full range of motion of lower extremities. Neurologic:  No focal neurological deficits.     Data Review: I have personally reviewed the laboratory data and studies available.   Joette Pebbles, MD [facility] 03/24/2024

## 2024-03-24 NOTE — Plan of Care (Signed)
  Problem: Clinical Measurements: Goal: Will remain free from infection Outcome: Progressing   Problem: Clinical Measurements: Goal: Diagnostic test results will improve Outcome: Not Progressing   Problem: Nutrition: Goal: Adequate nutrition will be maintained Outcome: Not Progressing   Problem: Clinical Measurements: Goal: Respiratory complications will improve Outcome: Adequate for Discharge Goal: Cardiovascular complication will be avoided Outcome: Adequate for Discharge

## 2024-03-24 NOTE — TOC Progression Note (Signed)
 Transition of Care [facility]) - Progression Note    Patient Details  Name: Connor Hansen MRN: [id number] Date of Birth: 10/19/1933  [facility] CM/SW Contact  Sheri ONEIDA Sharps, [place] Phone Number: 03/24/2024, 1:58 PM  Clinical Narrative:    Bed offer accepted at [facility]. Ins auth started; pending approval.   Expected Discharge Plan: Skilled Nursing Facility Barriers to Discharge: Continued Medical Work up, SNF Pending bed offer               Expected Discharge Plan and Services In-house Referral: Clinical Social Work Discharge Planning Services: NA Post Acute Care Choice: Skilled Nursing Facility Living arrangements for the past 2 months: Single Family Home                 DME Arranged: N/A DME Agency: NA       HH Arranged: NA HH Agency: NA         Social Drivers of Health (SDOH) Interventions SDOH Screenings   Food Insecurity: No Food Insecurity (03/21/2024)  Housing: Low Risk  (03/21/2024)  Transportation Needs: No Transportation Needs (03/21/2024)  Utilities: Not At Risk (03/21/2024)  Social Connections: Unknown (03/21/2024)  Tobacco Use: Medium Risk (03/21/2024)    Readmission Risk Interventions     No data to display

## 2024-03-24 NOTE — Plan of Care (Signed)
  Problem: Health Behavior/Discharge Planning: Goal: Ability to manage health-related needs will improve Outcome: Progressing   Problem: Activity: Goal: Risk for activity intolerance will decrease Outcome: Progressing   Problem: Nutrition: Goal: Adequate nutrition will be maintained Outcome: Progressing   Problem: Coping: Goal: Level of anxiety will decrease Outcome: Progressing   Problem: Pain Managment: Goal: General experience of comfort will improve and/or be controlled Outcome: Progressing   Problem: Safety: Goal: Ability to remain free from injury will improve Outcome: Progressing

## 2024-03-25 DIAGNOSIS — N390 Urinary tract infection, site not specified: Secondary | ICD-10-CM | POA: Diagnosis not present

## 2024-03-25 DIAGNOSIS — T83511A Infection and inflammatory reaction due to indwelling urethral catheter, initial encounter: Secondary | ICD-10-CM | POA: Diagnosis not present

## 2024-03-25 LAB — BASIC METABOLIC PANEL WITH GFR
Anion gap: 9 (ref 5–15)
BUN: 34 mg/dL — ABNORMAL HIGH (ref 8–23)
CO2: 19 mmol/L — ABNORMAL LOW (ref 22–32)
Calcium: 8.1 mg/dL — ABNORMAL LOW (ref 8.9–10.3)
Chloride: 111 mmol/L (ref 98–111)
Creatinine, Ser: 1.96 mg/dL — ABNORMAL HIGH (ref 0.61–1.24)
GFR, Estimated: 32 mL/min — ABNORMAL LOW (ref >60)
Glucose, Bld: 103 mg/dL — ABNORMAL HIGH (ref 70–99)
Potassium: 3.6 mmol/L (ref 3.5–5.1)
Sodium: 139 mmol/L (ref 135–145)

## 2024-03-25 MED ORDER — OXYCODONE HCL 5 MG PO TABS: 5.0000 mg | ORAL_TABLET | Freq: Four times a day (QID) | ORAL | 0 refills | Status: DC | PRN

## 2024-03-25 MED ORDER — LEVOTHYROXINE SODIUM 112 MCG PO TABS: 112.0000 ug | ORAL_TABLET | Freq: Every day | ORAL | Status: DC

## 2024-03-25 MED ORDER — OXYCODONE HCL ER 10 MG PO T12A: 10.0000 mg | EXTENDED_RELEASE_TABLET | Freq: Two times a day (BID) | ORAL | 0 refills | Status: DC

## 2024-03-25 MED ORDER — OMEGA-3-ACID ETHYL ESTERS 1 G PO CAPS
1.0000 g | ORAL_CAPSULE | Freq: Two times a day (BID) | ORAL | Status: DC
Start: 2024-03-25 — End: 2024-04-11

## 2024-03-25 MED ORDER — POLYETHYLENE GLYCOL 3350 17 G PO PACK: 17.0000 g | PACK | Freq: Every day | ORAL | Status: DC | PRN

## 2024-03-25 MED ORDER — SENNOSIDES-DOCUSATE SODIUM 8.6-50 MG PO TABS: 1.0000 | ORAL_TABLET | Freq: Every day | ORAL | Status: DC

## 2024-03-25 MED ORDER — ENSURE PLUS HIGH PROTEIN PO LIQD: 237.0000 mL | Freq: Two times a day (BID) | ORAL | Status: DC

## 2024-03-25 MED ORDER — CEPHALEXIN 500 MG PO CAPS: 1000.0000 mg | ORAL_CAPSULE | Freq: Two times a day (BID) | ORAL | Status: AC

## 2024-03-25 MED ORDER — ACETAMINOPHEN 500 MG PO TABS: 500.0000 mg | ORAL_TABLET | Freq: Three times a day (TID) | ORAL | Status: DC

## 2024-03-25 NOTE — TOC Transition Note (Signed)
 Transition of Care [facility]) - Discharge Note   Patient Details  Name: Connor Hansen MRN: [id number] Date of Birth: Mar 11, 1934  Transition of Care [facility]) CM/SW Contact:  Sonda Manuella Quill, RN Phone Number: 03/25/2024, 9:48 AM   Clinical Narrative:    Ins auth received; spoke w/ Randine Boyer, Admissions at [facility]; she gave RM # [phone], call report # [phone]; pt/son notified; pt's son will provide transportation; d/c plan; d/c summary and  SNF transfer report sent via SNF Hub; no TOC needs.  Final next level of care: Skilled Nursing Facility Barriers to Discharge: No Barriers Identified   Patient Goals and CMS Choice Patient states their goals for this hospitalization and ongoing recovery are:: To go to SNF for rehab CMS Medicare.gov Compare Post Acute Care list provided to:: Patient Represenative (must comment) (Pt's son, [employer]) Choice offered to / list presented to : Adult Children [place] ownership interest in [facility].provided to:: Adult Children    Discharge Placement              Patient chooses bed at: [facility], [place] Patient to be transferred to facility by: [facility] Name of family member notified: Cairo Agostinelli (son) [phone] Patient and family notified of of transfer: 03/25/24  Discharge Plan and Services Additional resources added to the After Visit Summary for   In-house Referral: Clinical Social Work Discharge Planning Services: NA Post Acute Care Choice: Skilled Nursing Facility          DME Arranged: N/A DME Agency: NA       HH Arranged: NA HH Agency: NA        Social Drivers of Health (SDOH) Interventions SDOH Screenings   Food Insecurity: No Food Insecurity (03/21/2024)  Housing: Low Risk  (03/21/2024)  Transportation Needs: No Transportation Needs (03/21/2024)  Utilities: Not At Risk (03/21/2024)  Social Connections: Unknown (03/21/2024)  Tobacco Use: Medium Risk (03/21/2024)     Readmission Risk Interventions      No data to display

## 2024-03-25 NOTE — Discharge Summary (Signed)
 [facility]  Physician Discharge Summary   Patient ID: Connor Hansen MRN: [id number] DOB/AGE: August 02, 1934 89 y.o.  Admit date: 03/21/2024 Discharge date:   03/25/2024   PCP: Ransom Other, MD  DISCHARGE DIAGNOSES:    UTI (urinary tract infection) due to urinary indwelling catheter (HCC)   Hyperlipidemia   HTN (hypertension)   Coronary artery disease due to lipid rich plaque   Primary gout   Hypothyroidism   Rheumatoid arthritis (HCC)   Stage 3a chronic kidney disease (HCC)   AKI (acute kidney injury) (HCC)   Acute encephalopathy   Normocytic anemia   Prostate cancer (HCC) Cancer associated pain  RECOMMENDATIONS FOR OUTPATIENT FOLLOW UP: Patient should follow-up with his urologist Dr. Christopher in 2 to 3 weeks after discharge Please check CBC and basic metabolic panel in 1 week  Home Health: Going to SNF Equipment/Devices: None  CODE STATUS: Full code  DISCHARGE CONDITION: fair  Diet recommendation: Heart healthy  INITIAL HISTORY: Connor Hansen is a 89 y.o. male with PMH significant for HTN, HLD, CAD, CKD, RA, GERD, gout, hypothyroidism, osteoarthritis, rheumatoid arthritis on Enbrel and weekly methotrexate, recently diagnosed with prostate cancer, has indwelling Foley catheter. 8/10, patient presented to the ED with complaint of generalized weakness, fever, decreased appetite.  He was recently started on a course of Bactrim by urologist for UTI.  Despite being on antibiotic for 2 days, weakness continued.  He was found on the floor confused by his son and brought to the ED.  Lives at home alone.   In the ED, patient was initially afebrile, blood pressure in the low normal range. In few hours he mounted a fever of 101.2 Labs showed WC count 8.8, hemoglobin 7.7, platelet 102, lactic acid normal, sodium 130, serum bicarb 18, BUN/creatinine 38/2.69 Urinalysis showed cloudy yellow urine with large leukocytes, rare bacteria, negative nitrite CT abdomen pelvis did not  show any obstructive ureteral calculi but showed stable moderate bilateral hydroureteronephrosis and stable mildly enlarged prostate, stable diffuse sclerotic bone metastases. Urine culture and blood culture were sent.    HOSPITAL COURSE:   UTI associated with indwelling Foley catheter  E. coli UTI and bacteremia Presented with fever, weakness Urine culture and blood preliminary culture showing E. Coli Patient was initially treated with ceftriaxone  and was changed over to cephalexin .  Remains afebrile with normal WBC. Lactic acid level was normal as well.   Metastatic prostate cancer Bilateral hydronephrosis Has indwelling Foley catheter.  Foley catheter was changed out on 03/24/2024 Per CT scan, bilateral hydronephrosis and diffuse bone mets are stable. Per family, urologist planned to maintain Foley catheter for foreseeable future. Patient is noted to be on scheduled Tylenol  and oxycodone  ER for pain control.   AKI on CKD 3b Acute metabolic acidosis Baseline creatinine 1.89 from July 2025.   Creatinine has improved and almost back to baseline.   Acute metabolic encephalopathy Was confused, altered at presentation secondary to UTI.   Mental status is gradually improving. Stable for the most part.   chronic systolic CHF  Essential hypertension PTA meds- carvedilol  3.125 mg twice daily, doxazosin 2 mg nightly, amlodipine 10 mg daily, valsartan 160 mg daily, Lasix  as needed Will be discharged on carvedilol  and doxazosin.  Amlodipine ARB combination will be held. Amlodipine at low-dose can be initiated if blood pressure remains in the hypertensive range. Echocardiogram showed stable LVEF of 40 to 45%. No evidence for volume overload currently.  Holding ARB and diuretics due to AKI.   H/o CAD HLD Continue  Coreg , aspirin , statin   Rheumatoid arthritis Continue home medications   Primary gout Continue allopurinol     Hypothyroidism Continue levothyroxine  112 mcg p.o. daily.   Per patient's son, it was recently increased from 75 mcg daily.   Chronic normocytic anemia  Stable hemoglobin without any evidence of overt bleeding.   Impaired mobility:  PT eval obtained.  Recommended Skilled Nursing-Short Term Rehab.    Patient is stable.  Okay for discharge to SNF.  Discussed with patient and his son this morning.   PERTINENT LABS:  The results of significant diagnostics from this hospitalization (including imaging, microbiology, ancillary and laboratory) are listed below for reference.    Microbiology: Recent Results (from the past 240 hours)  Blood Culture (routine x 2)     Status: Abnormal   Collection Time: 03/21/24  9:24 AM   Specimen: BLOOD LEFT HAND  Result Value Ref Range Status   Specimen Description   Final    BLOOD LEFT HAND Performed at [facility], [address] [place], [place] [address]    Special Requests   Final    BOTTLES DRAWN AEROBIC AND ANAEROBIC Blood Culture results may not be optimal due to an inadequate volume of blood received in culture bottles Performed at [facility], [address], [place], [place] [address]    Culture  Setup Time   Final    GRAM NEGATIVE RODS AEROBIC BOTTLE ONLY CRITICAL VALUE NOTED.  VALUE IS CONSISTENT WITH PREVIOUSLY REPORTED AND CALLED VALUE.    Culture (A)  Final    ESCHERICHIA COLI SUSCEPTIBILITIES PERFORMED ON PREVIOUS CULTURE WITHIN THE LAST 5 DAYS. Performed at [facility], [address] [place], [place] [address]    Report Status 03/24/2024 FINAL  Final  Blood Culture (routine x 2)     Status: Abnormal   Collection Time: 03/21/24  9:24 AM   Specimen: BLOOD RIGHT ARM  Result Value Ref Range Status   Specimen Description   Final    BLOOD RIGHT ARM Performed at [facility], [address], [place], [place] [address]    Special Requests   Final    BOTTLES DRAWN AEROBIC AND ANAEROBIC Blood Culture results may not be optimal due to an inadequate  volume of blood received in culture bottles Performed at [facility], [address] [place], [place] [address]    Culture  Setup Time   Final    GRAM NEGATIVE RODS IN BOTH AEROBIC AND ANAEROBIC BOTTLES CRITICAL RESULT CALLED TO, READ BACK BY AND VERIFIED WITH: PHARMD S DAVIS 03/22/2024 @ 0404 BY AB Performed at [facility], [address], [place], [place] [address]    Culture ESCHERICHIA COLI (A)  Final   Report Status 03/23/2024 FINAL  Final   Organism ID, Bacteria ESCHERICHIA COLI  Final      Susceptibility   Escherichia coli - MIC*    AMPICILLIN >=32 RESISTANT Resistant     CEFAZOLIN Value in next row Sensitive      2 SENSITIVEThis is a modified FDA-approved test that has been validated and its performance characteristics determined by the reporting laboratory.  This laboratory is certified under the Clinical Laboratory Improvement Amendments CLIA as qualified to perform high complexity clinical laboratory testing.    CEFEPIME Value in next row Sensitive      2 SENSITIVEThis is a modified FDA-approved test that has been validated and its performance characteristics determined by the reporting laboratory.  This  laboratory is certified under the Clinical Laboratory Improvement Amendments CLIA as qualified to perform high complexity clinical laboratory testing.    ERTAPENEM Value in next row Sensitive      2 SENSITIVEThis is a modified FDA-approved test that has been validated and its performance characteristics determined by the reporting laboratory.  This laboratory is certified under the Clinical Laboratory Improvement Amendments CLIA as qualified to perform high complexity clinical laboratory testing.    CEFTRIAXONE  Value in next row Sensitive      2 SENSITIVEThis is a modified FDA-approved test that has been validated and its performance characteristics determined by the reporting laboratory.  This laboratory is certified under the Clinical Laboratory  Improvement Amendments CLIA as qualified to perform high complexity clinical laboratory testing.    CIPROFLOXACIN Value in next row Resistant      2 SENSITIVEThis is a modified FDA-approved test that has been validated and its performance characteristics determined by the reporting laboratory.  This laboratory is certified under the Clinical Laboratory Improvement Amendments CLIA as qualified to perform high complexity clinical laboratory testing.    GENTAMICIN Value in next row Sensitive      2 SENSITIVEThis is a modified FDA-approved test that has been validated and its performance characteristics determined by the reporting laboratory.  This laboratory is certified under the Clinical Laboratory Improvement Amendments CLIA as qualified to perform high complexity clinical laboratory testing.    MEROPENEM Value in next row Sensitive      2 SENSITIVEThis is a modified FDA-approved test that has been validated and its performance characteristics determined by the reporting laboratory.  This laboratory is certified under the Clinical Laboratory Improvement Amendments CLIA as qualified to perform high complexity clinical laboratory testing.    TRIMETH/SULFA Value in next row Resistant      2 SENSITIVEThis is a modified FDA-approved test that has been validated and its performance characteristics determined by the reporting laboratory.  This laboratory is certified under the Clinical Laboratory Improvement Amendments CLIA as qualified to perform high complexity clinical laboratory testing.    AMPICILLIN/SULBACTAM Value in next row Sensitive      2 SENSITIVEThis is a modified FDA-approved test that has been validated and its performance characteristics determined by the reporting laboratory.  This laboratory is certified under the Clinical Laboratory Improvement Amendments CLIA as qualified to perform high complexity clinical laboratory testing.    PIP/TAZO Value in next row Sensitive ug/mL     <=4  SENSITIVEThis is a modified FDA-approved test that has been validated and its performance characteristics determined by the reporting laboratory.  This laboratory is certified under the Clinical Laboratory Improvement Amendments CLIA as qualified to perform high complexity clinical laboratory testing.    * ESCHERICHIA COLI  Blood Culture ID Panel (Reflexed)     Status: Abnormal   Collection Time: 03/21/24  9:24 AM  Result Value Ref Range Status   Enterococcus faecalis NOT DETECTED NOT DETECTED Final   Enterococcus Faecium NOT DETECTED NOT DETECTED Final   Listeria monocytogenes NOT DETECTED NOT DETECTED Final   Staphylococcus species NOT DETECTED NOT DETECTED Final   Staphylococcus aureus (BCID) NOT DETECTED NOT DETECTED Final   Staphylococcus epidermidis NOT DETECTED NOT DETECTED Final   Staphylococcus lugdunensis NOT DETECTED NOT DETECTED Final   Streptococcus species NOT DETECTED NOT DETECTED Final   Streptococcus agalactiae NOT DETECTED NOT DETECTED Final   Streptococcus pneumoniae NOT DETECTED NOT DETECTED Final   Streptococcus pyogenes NOT DETECTED NOT DETECTED Final   A.calcoaceticus-baumannii NOT DETECTED NOT DETECTED  Final   Bacteroides fragilis NOT DETECTED NOT DETECTED Final   Enterobacterales DETECTED (A) NOT DETECTED Final    Comment: Enterobacterales represent a large order of gram negative bacteria, not a single organism. CRITICAL RESULT CALLED TO, READ BACK BY AND VERIFIED WITH: PHARMD S DAVIS 03/22/2024 @ 0404 BY AB    Enterobacter cloacae complex NOT DETECTED NOT DETECTED Final   Escherichia coli DETECTED (A) NOT DETECTED Final    Comment: CRITICAL RESULT CALLED TO, READ BACK BY AND VERIFIED WITH: PHARMD S DAVIS 03/22/2024 @ 0404 BY AB    Klebsiella aerogenes NOT DETECTED NOT DETECTED Final   Klebsiella oxytoca NOT DETECTED NOT DETECTED Final   Klebsiella pneumoniae NOT DETECTED NOT DETECTED Final   Proteus species NOT DETECTED NOT DETECTED Final   Salmonella species  NOT DETECTED NOT DETECTED Final   Serratia marcescens NOT DETECTED NOT DETECTED Final   Haemophilus influenzae NOT DETECTED NOT DETECTED Final   Neisseria meningitidis NOT DETECTED NOT DETECTED Final   Pseudomonas aeruginosa NOT DETECTED NOT DETECTED Final   Stenotrophomonas maltophilia NOT DETECTED NOT DETECTED Final   Candida albicans NOT DETECTED NOT DETECTED Final   Candida auris NOT DETECTED NOT DETECTED Final   Candida glabrata NOT DETECTED NOT DETECTED Final   Candida krusei NOT DETECTED NOT DETECTED Final   Candida parapsilosis NOT DETECTED NOT DETECTED Final   Candida tropicalis NOT DETECTED NOT DETECTED Final   Cryptococcus neoformans/gattii NOT DETECTED NOT DETECTED Final   CTX-M ESBL NOT DETECTED NOT DETECTED Final   Carbapenem resistance IMP NOT DETECTED NOT DETECTED Final   Carbapenem resistance KPC NOT DETECTED NOT DETECTED Final   Carbapenem resistance NDM NOT DETECTED NOT DETECTED Final   Carbapenem resist OXA 48 LIKE NOT DETECTED NOT DETECTED Final   Carbapenem resistance VIM NOT DETECTED NOT DETECTED Final    Comment: Performed at [facility], [address], [place], [place] [address]  Urine Culture     Status: Abnormal   Collection Time: 03/21/24  9:47 AM   Specimen: Urine, Random  Result Value Ref Range Status   Specimen Description   Final    URINE, RANDOM Performed at [facility], [address], [place], [place] [address]    Special Requests   Final    NONE Reflexed from [phone] Performed at [facility], [address] [place], [place] [address]    Culture >=100,000 COLONIES/mL ESCHERICHIA COLI (A)  Final   Report Status 03/23/2024 FINAL  Final   Organism ID, Bacteria ESCHERICHIA COLI (A)  Final      Susceptibility   Escherichia coli - MIC*    AMPICILLIN >=32 RESISTANT Resistant     CEFAZOLIN Value in next row Sensitive      2 SENSITIVEThis is a modified FDA-approved test that has been validated and its  performance characteristics determined by the reporting laboratory.  This laboratory is certified under the Clinical Laboratory Improvement Amendments CLIA as qualified to perform high complexity clinical laboratory testing.    CEFEPIME Value in next row Sensitive      2 SENSITIVEThis is a modified FDA-approved test that has been validated and its performance characteristics determined by the reporting laboratory.  This laboratory is certified under the Clinical Laboratory Improvement Amendments CLIA as qualified to perform high complexity clinical laboratory testing.    ERTAPENEM Value in next row Sensitive      2 SENSITIVEThis is a modified FDA-approved test that has been validated and its performance characteristics determined by  the reporting laboratory.  This laboratory is certified under the Clinical Laboratory Improvement Amendments CLIA as qualified to perform high complexity clinical laboratory testing.    CEFTRIAXONE  Value in next row Sensitive      2 SENSITIVEThis is a modified FDA-approved test that has been validated and its performance characteristics determined by the reporting laboratory.  This laboratory is certified under the Clinical Laboratory Improvement Amendments CLIA as qualified to perform high complexity clinical laboratory testing.    CIPROFLOXACIN Value in next row Resistant      2 SENSITIVEThis is a modified FDA-approved test that has been validated and its performance characteristics determined by the reporting laboratory.  This laboratory is certified under the Clinical Laboratory Improvement Amendments CLIA as qualified to perform high complexity clinical laboratory testing.    GENTAMICIN Value in next row Sensitive      2 SENSITIVEThis is a modified FDA-approved test that has been validated and its performance characteristics determined by the reporting laboratory.  This laboratory is certified under the Clinical Laboratory Improvement Amendments CLIA as qualified to  perform high complexity clinical laboratory testing.    NITROFURANTOIN Value in next row Sensitive      2 SENSITIVEThis is a modified FDA-approved test that has been validated and its performance characteristics determined by the reporting laboratory.  This laboratory is certified under the Clinical Laboratory Improvement Amendments CLIA as qualified to perform high complexity clinical laboratory testing.    TRIMETH/SULFA Value in next row Resistant      2 SENSITIVEThis is a modified FDA-approved test that has been validated and its performance characteristics determined by the reporting laboratory.  This laboratory is certified under the Clinical Laboratory Improvement Amendments CLIA as qualified to perform high complexity clinical laboratory testing.    AMPICILLIN/SULBACTAM Value in next row Sensitive      2 SENSITIVEThis is a modified FDA-approved test that has been validated and its performance characteristics determined by the reporting laboratory.  This laboratory is certified under the Clinical Laboratory Improvement Amendments CLIA as qualified to perform high complexity clinical laboratory testing.    PIP/TAZO Value in next row Sensitive ug/mL     <=4 SENSITIVEThis is a modified FDA-approved test that has been validated and its performance characteristics determined by the reporting laboratory.  This laboratory is certified under the Clinical Laboratory Improvement Amendments CLIA as qualified to perform high complexity clinical laboratory testing.    MEROPENEM Value in next row Sensitive      <=4 SENSITIVEThis is a modified FDA-approved test that has been validated and its performance characteristics determined by the reporting laboratory.  This laboratory is certified under the Clinical Laboratory Improvement Amendments CLIA as qualified to perform high complexity clinical laboratory testing.    * >=100,000 COLONIES/mL ESCHERICHIA COLI     Labs:   Basic Metabolic Panel: Recent Labs   Lab 03/21/24 0924 03/22/24 0521 03/23/24 0504 03/24/24 0526 03/25/24 0510  NA 130* 135 134* 135 139  K 3.5 4.1 3.7 3.9 3.6  CL 104 106 107 107 111  CO2 18* 18* 20* 20* 19*  GLUCOSE 101* 106* 112* 95 103*  BUN 38* 43* 41* 37* 34*  CREATININE 2.69* 2.72* 2.62* 2.34* 1.96*  CALCIUM  7.5* 8.0* 7.9* 8.0* 8.1*   Liver Function Tests: Recent Labs  Lab 03/21/24 0924 03/22/24 0521  AST 51* 57*  ALT 26 27  ALKPHOS 168* 174*  BILITOT 1.0 0.8  PROT 5.6* 5.4*  ALBUMIN 2.7* 2.7*    CBC: Recent Labs  Lab 03/21/24  9075 03/22/24 0521 03/23/24 0504 03/24/24 0526  WBC 8.8 8.2 6.4 7.6  NEUTROABS 7.8*  --  4.8 5.5  HGB 7.7* 8.7* 8.1* 8.4*  HCT 24.0* 27.7* 25.1* 26.1*  MCV 91.3 95.5 91.3 90.6  PLT 102* 112* 120* 161     IMAGING STUDIES ECHOCARDIOGRAM COMPLETE Result Date: 03/23/2024    ECHOCARDIOGRAM REPORT   Patient Name:   SYLIS KETCHUM Date of Exam: 03/23/2024 Medical Rec #:  [id number]      Height:       69.0 in Accession #:    [id number]     Weight:       177.2 lb Date of Birth:  [birth year]      BSA:          1.963 m Patient Age:    89 years       BP:           140/73 mmHg Patient Gender: M              HR:           63 bpm. Exam Location:  Inpatient Procedure: 2D Echo, 3D Echo, Cardiac Doppler and Color Doppler (Both Spectral            and Color Flow Doppler were utilized during procedure). Indications:    Abnormal ECG  History:        Patient has prior history of Echocardiogram examinations, most                 recent 04/27/2019. CAD; Risk Factors:Dyslipidemia and                 Hypertension. CKD Stage 3.  Sonographer:    Philomena Daring Referring Phys: [id number] BINAYA DAHAL IMPRESSIONS  1. Left ventricular ejection fraction, by estimation, is 40 to 45%. The left ventricle has mildly decreased function. The left ventricle demonstrates regional wall motion abnormalities (see scoring diagram/findings for description). Left ventricular diastolic parameters were normal.  2. Right ventricular  systolic function is normal. The right ventricular size is normal. There is normal pulmonary artery systolic pressure. The estimated right ventricular systolic pressure is 15.4 mmHg.  3. The mitral valve is grossly normal. Trivial mitral valve regurgitation. No evidence of mitral stenosis.  4. The aortic valve is tricuspid. There is mild calcification of the aortic valve. Aortic valve regurgitation is not visualized. Aortic valve sclerosis/calcification is present, without any evidence of aortic stenosis.  5. The inferior vena cava is normal in size with greater than 50% respiratory variability, suggesting right atrial pressure of 3 mmHg. Comparison(s): No significant change from prior study. Conclusion(s)/Recommendation(s): Findings consistent with ischemic cardiomyopathy. FINDINGS  Left Ventricle: Left ventricular ejection fraction, by estimation, is 40 to 45%. The left ventricle has mildly decreased function. The left ventricle demonstrates regional wall motion abnormalities. The left ventricular internal cavity size was normal in size. There is no left ventricular hypertrophy. Left ventricular diastolic parameters were normal.  LV Wall Scoring: The mid and distal anterior septum, apical anterior segment, apical inferior segment, and apex are akinetic. Right Ventricle: The right ventricular size is normal. No increase in right ventricular wall thickness. Right ventricular systolic function is normal. There is normal pulmonary artery systolic pressure. The tricuspid regurgitant velocity is 1.76 m/s, and  with an assumed right atrial pressure of 3 mmHg, the estimated right ventricular systolic pressure is 15.4 mmHg. Left Atrium: Left atrial size was normal in size. Right Atrium: Right atrial size was normal in size.  Pericardium: Trivial pericardial effusion is present. Mitral Valve: The mitral valve is grossly normal. Trivial mitral valve regurgitation. No evidence of mitral valve stenosis. Tricuspid Valve: The  tricuspid valve is grossly normal. Tricuspid valve regurgitation is trivial. No evidence of tricuspid stenosis. Aortic Valve: The aortic valve is tricuspid. There is mild calcification of the aortic valve. Aortic valve regurgitation is not visualized. Aortic valve sclerosis/calcification is present, without any evidence of aortic stenosis. Aortic valve mean gradient measures 7.5 mmHg. Aortic valve peak gradient measures 13.8 mmHg. Aortic valve area, by VTI measures 2.19 cm. Pulmonic Valve: The pulmonic valve was grossly normal. Pulmonic valve regurgitation is not visualized. No evidence of pulmonic stenosis. Aorta: The aortic root and ascending aorta are structurally normal, with no evidence of dilitation. Venous: The inferior vena cava is normal in size with greater than 50% respiratory variability, suggesting right atrial pressure of 3 mmHg. IAS/Shunts: The atrial septum is grossly normal.  LEFT VENTRICLE PLAX 2D LVIDd:         4.70 cm     Diastology LVIDs:         3.30 cm     LV e' medial:    8.59 cm/s LV PW:         1.00 cm     LV E/e' medial:  11.8 LV IVS:        1.00 cm     LV e' lateral:   10.70 cm/s LVOT diam:     2.10 cm     LV E/e' lateral: 9.4 LV SV:         91 LV SV Index:   46 LVOT Area:     3.46 cm  LV Volumes (MOD) LV vol d, MOD A2C: 91.0 ml LV vol d, MOD A4C: 99.9 ml LV vol s, MOD A2C: 29.9 ml LV vol s, MOD A4C: 34.5 ml LV SV MOD A2C:     61.1 ml LV SV MOD A4C:     99.9 ml LV SV MOD BP:      65.0 ml RIGHT VENTRICLE             IVC RV S prime:     13.70 cm/s  IVC diam: 1.60 cm TAPSE (M-mode): 2.3 cm LEFT ATRIUM           Index        RIGHT ATRIUM           Index LA diam:      3.40 cm 1.73 cm/m   RA Area:     11.10 cm LA Vol (A2C): 48.6 ml 24.76 ml/m  RA Volume:   20.40 ml  10.39 ml/m LA Vol (A4C): 49.0 ml 24.97 ml/m  AORTIC VALVE AV Area (Vmax):    2.25 cm AV Area (Vmean):   2.24 cm AV Area (VTI):     2.19 cm AV Vmax:           186.00 cm/s AV Vmean:          127.000 cm/s AV VTI:             0.415 m AV Peak Grad:      13.8 mmHg AV Mean Grad:      7.5 mmHg LVOT Vmax:         121.00 cm/s LVOT Vmean:        82.000 cm/s LVOT VTI:          0.262 m LVOT/AV VTI ratio: 0.63  AORTA Ao Root diam: 2.50 cm Ao  Asc diam:  3.30 cm MITRAL VALVE                TRICUSPID VALVE MV Area (PHT): 3.74 cm     TR Peak grad:   12.4 mmHg MV Decel Time: 203 msec     TR Vmax:        176.00 cm/s MV E velocity: 101.00 cm/s MV A velocity: 117.00 cm/s  SHUNTS MV E/A ratio:  0.86         Systemic VTI:  0.26 m                             Systemic Diam: 2.10 cm Darryle Decent MD Electronically signed by Darryle Decent MD Signature Date/Time: 03/23/2024/3:10:51 PM    Final    CT ABDOMEN PELVIS WO CONTRAST Result Date: 03/21/2024 CLINICAL DATA:  Urosepsis. Metastatic prostate carcinoma. * Tracking Code: BO * EXAM: CT ABDOMEN AND PELVIS WITHOUT CONTRAST TECHNIQUE: Multidetector CT imaging of the abdomen and pelvis was performed following the standard protocol without IV contrast. RADIATION DOSE REDUCTION: This exam was performed according to the departmental dose-optimization program which includes automated exposure control, adjustment of the mA and/or kV according to patient size and/or use of iterative reconstruction technique. COMPARISON:  03/02/2024 FINDINGS: Lower chest: New tiny left pleural effusion fall. Hepatobiliary: No mass visualized on this unenhanced exam. Prior cholecystectomy. No evidence of biliary obstruction. Pancreas: No mass or inflammatory process visualized on this unenhanced exam. Spleen:  Within normal limits in size. Adrenals/Urinary tract: Moderate bilateral hydroureteronephrosis is stable, and no ureteral calculi identified. Foley catheter is seen within the bladder which is decompressed. Stomach/Bowel: No evidence of obstruction, inflammatory process, or abnormal fluid collections. Small left inguinal hernia again seen containing a loop of sigmoid colon. Vascular/Lymphatic: No pathologically enlarged lymph  nodes identified. No evidence of abdominal aortic aneurysm. Reproductive:  Stable mildly enlarged prostate. Other:  None. Musculoskeletal: Diffuse sclerotic bone metastases are again seen, without significant change. IMPRESSION: Stable moderate bilateral hydroureteronephrosis. No ureteral calculi or other obstructing etiology apparent by CT. Stable mildly enlarged prostate. Stable diffuse sclerotic bone metastases. New tiny left pleural effusion. Stable small left inguinal hernia. Electronically Signed   By: Norleen DELENA Kil M.D.   On: 03/21/2024 11:01   CT Head Wo Contrast Result Date: 03/21/2024 CLINICAL DATA:  Recent fall.  Confusion.  Altered mental status. EXAM: CT HEAD WITHOUT CONTRAST TECHNIQUE: Contiguous axial images were obtained from the base of the skull through the vertex without intravenous contrast. RADIATION DOSE REDUCTION: This exam was performed according to the departmental dose-optimization program which includes automated exposure control, adjustment of the mA and/or kV according to patient size and/or use of iterative reconstruction technique. COMPARISON:  None Available. FINDINGS: Brain: No evidence of intracranial hemorrhage, acute infarction, hydrocephalus, extra-axial collection, or mass lesion/mass effect. Mild-to-moderate diffuse cerebral atrophy and chronic small vessel disease noted. Vascular:  No hyperdense vessel or other acute findings. Skull: No evidence of fracture or other significant bone abnormality. Sinuses/Orbits:  No acute findings. Other: None. IMPRESSION: No acute intracranial abnormality. Cerebral atrophy and chronic small vessel disease. Electronically Signed   By: Norleen DELENA Kil M.D.   On: 03/21/2024 10:49   DG Chest Port 1 View Result Date: 03/21/2024 EXAM: 1 VIEW XRAY OF THE CHEST 03/21/2024 09:12:59 AM COMPARISON: 2 view chest x-ray 06/17/2018 and CT of the chest 03/02/2024. CLINICAL HISTORY: Questionable sepsis - evaluate for abnormality. Weakness after being  diagnosed with UTI two  days ago, taking antibiotics for treatment. Diagnosed with pancreatic cancer recently. FINDINGS: LUNGS AND PLEURA: Scarring at the left base is stable. Lung volumes are low. No focal pulmonary opacity. No pulmonary edema. No pleural effusion. No pneumothorax. HEART AND MEDIASTINUM: No acute abnormality of the cardiac and mediastinal silhouettes. BONES AND SOFT TISSUES: No acute osseous abnormality. IMPRESSION: 1. No acute cardiopulmonary abnormality. Electronically signed by: Lonni Necessary MD 03/21/2024 09:26 AM EDT RP Workstation: [id number]   NM PET (PSMA) SKULL TO MID THIGH Result Date: 03/18/2024 CLINICAL DATA:  Diffuse osseous sclerosis on CT. Family history of prostate cancer. EXAM: NUCLEAR MEDICINE PET SKULL BASE TO THIGH TECHNIQUE: 8.4 mCi Flotufolastat (Posluma ) was injected intravenously. Full-ring PET imaging was performed from the skull base to thigh after the radiotracer. CT data was obtained and used for attenuation correction and anatomic localization. COMPARISON:  03/02/2024 abdominopelvic CT. FINDINGS: NECK Diminutive low right posterior triangle node with tracer affinity at a S.U.V. max of 3.4 on 42/4. Incidental CT finding: No cervical adenopathy. Carotid atherosclerosis. CHEST No pulmonary parenchymal tracer affinity. Small mediastinal tracer avid nodes. Right infrahilar index node measures a S.U.V. max of 6.3. Incidental CT finding: Aortic and coronary artery calcification. Calcified thoracic nodes are likely related to old granulomatous disease. Tiny bilateral pleural effusions may be physiologic. Subpleural interstitial thickening is mild and could represent interstitial lung disease but is nonspecific in this age group. ABDOMEN/PELVIS Prostate: Eccentric left prostatic tracer affinity, base to apex and large volume. Example at a S.U.V. max of 18.0. Lymph nodes: Abdominal retroperitoneal tracer avid nodes. Example aortocaval node of 6 mm and a S.U.V. max of 9.5  on 133/4. Small tracer avid pelvic nodes including a right external iliac node of 5 mm and a S.U.V. max of 6.8 on 180/4. Liver: No evidence of liver metastasis. Incidental CT finding: Normal adrenal glands. Cholecystectomy. Aortic atherosclerosis. Dominant left renal cyst of 3.3 cm . In the absence of clinically indicated signs/symptoms require(s) no independent follow-up. Moderate bilateral hydroureteronephrosis is relatively similar. At the level of the pelvic brim, the ureters undergo a relatively gradual transition to decompressed. When compared to the prior contrast-enhanced CT, there is subtle bilateral urothelial wall thickening and/or hyperenhancement in this region (example 56/2 of that exam). Foley catheter within the urinary bladder. Nondependent air is likely secondary. SKELETON Diffuse osseous metastasis. Example left posterior iliac ill-defined sclerotic lesion at a S.U.V. max of 12.4 on 163/4. IMPRESSION: 1. Findings consistent with widespread metastatic prostate cancer, including to bones, nodes of the low neck, chest, abdomen, and pelvis. 2. Similar moderate bilateral hydronephrosis and proximal hydroureter. Transition to normal caliber distal ureters, likely secondary to mid ureteric metastasis versus less likely synchronous urothelial carcinomas. Consider retrograde studies. 3. Tiny bilateral pleural effusions 4. Incidental findings, including: Coronary artery atherosclerosis. Aortic Atherosclerosis (ICD10-I70.0). Electronically Signed   By: Rockey Kilts M.D.   On: 03/18/2024 09:16   LONG TERM MONITOR (3-14 DAYS) Result Date: 03/10/2024   Sinus rhythm average heart rate 60 bpm   64 episodes of supraventricular tachycardia-atrial tachycardia, longest 19 beats with average heart rate of 118 bpm-benign   Rare PACs, PVCs   No atrial fibrillation present. Patch Wear Time:  6 days and 15 hours (2025-07-12T19:30:02-399 to 2025-07-19T10:48:15-0400) Patient had a min HR of 46 bpm, max HR of 167 bpm,  and avg HR of 60 bpm. Predominant underlying rhythm was Sinus Rhythm. Slight P wave morphology changes were noted. 64 Supraventricular Tachycardia runs occurred, the run with the fastest interval lasting  5  beats with a max rate of 167 bpm, the longest lasting 19 beats with an avg rate of 118 bpm. Isolated SVEs were rare (<1.0%), SVE Couplets were rare (<1.0%), and SVE Triplets were rare (<1.0%). Isolated VEs were rare (<1.0%), and no VE Couplets or VE Triplets were present.   CT ABDOMEN PELVIS W CONTRAST Result Date: 03/02/2024 CLINICAL DATA:  Right lower quadrant pain EXAM: CT ABDOMEN AND PELVIS WITH CONTRAST TECHNIQUE: Multidetector CT imaging of the abdomen and pelvis was performed using the standard protocol following bolus administration of intravenous contrast. RADIATION DOSE REDUCTION: This exam was performed according to the departmental dose-optimization program which includes automated exposure control, adjustment of the mA and/or kV according to patient size and/or use of iterative reconstruction technique. CONTRAST:  80mL OMNIPAQUE  IOHEXOL  300 MG/ML  SOLN COMPARISON:  None Available. FINDINGS: Lower chest: Lung bases demonstrate possible small irregular 5 mm lingular nodule on series 6, image 1. 6 mm anterior right lower lobe pulmonary nodule on series 6, image 9. Mild subpleural reticulation and emphysema. No acute airspace disease. Hepatobiliary: Cholecystectomy. No focal hepatic abnormality. No biliary dilatation Pancreas: Unremarkable. No pancreatic ductal dilatation or surrounding inflammatory changes. Spleen: Splenic granuloma Adrenals/Urinary Tract: Adrenal glands are normal. 4.1 cm cyst at the midpole left kidney, no imaging follow-up recommended. Moderate to marked bilateral hydronephrosis and hydroureter without obstructing stone. Distended urinary bladder. Slightly diminished contrast excretion of the left greater than right kidneys, possibly due to obstructive change. Favor intrarenal  vascular calcification over small kidney stones. Stomach/Bowel: Stomach nonenlarged. No dilated small bowel. No acute bowel wall thickening. Negative appendix. Moderate stool in the right colon. Vascular/Lymphatic: Advanced aortic atherosclerosis. No aneurysm. No suspicious lymph nodes. Reproductive: Enlarged prostate with mass effect on the bladder Other: Negative for pelvic effusion or free air. Small fat containing left inguinal hernia Musculoskeletal: Diffuse heterogeneous sclerosis involving the ribs, spine, and pelvis. IMPRESSION: 1. Moderate to marked bilateral hydronephrosis and hydroureter without obstructing stone. Distended urinary bladder. Enlarged prostate with mass effect on the bladder. Findings could be secondary to bladder outlet obstruction. 2. Diffuse heterogeneous sclerosis involving the ribs, spine, and pelvis, findings are suspicious for osseous metastatic disease. 3. Enlarged prostate with mass effect on the bladder, correlate with PSA 4. There are a few small pulmonary nodules at the lung bases measuring up to 6 mm. These are indeterminate given findings suspicious for skeletal metastatic disease. 5. Aortic atherosclerosis. Aortic Atherosclerosis (ICD10-I70.0) and Emphysema (ICD10-J43.9). Electronically Signed   By: Luke Bun M.D.   On: 03/02/2024 19:13    DISCHARGE EXAMINATION: Vitals:   03/24/24 0539 03/24/24 1352 03/24/24 2138 03/25/24 0432  BP: (!) 144/61 133/64 137/63 (!) 155/65  Pulse: 65 66 64 69  Resp: 16 16 16 16   Temp: 98.4 F (36.9 C) 97.7 F (36.5 C) 98.3 F (36.8 C) 97.9 F (36.6 C)  TempSrc:      SpO2: 95% 94% 98% 98%  Weight:      Height:       General appearance: Awake alert.  In no distress Resp: Clear to auscultation bilaterally.  Normal effort Cardio: S1-S2 is normal regular.  No S3-S4.  No rubs murmurs or bruit GI: Abdomen is soft.  Nontender nondistended.  Bowel sounds are present normal.  No masses organomegaly    DISPOSITION:  SNF  Discharge Instructions     Amb Referral to Palliative Care   Complete by: As directed    Call MD for:  difficulty breathing, headache or visual disturbances   Complete  by: As directed    Call MD for:  extreme fatigue   Complete by: As directed    Call MD for:  persistant dizziness or light-headedness   Complete by: As directed    Call MD for:  persistant nausea and vomiting   Complete by: As directed    Call MD for:  severe uncontrolled pain   Complete by: As directed    Call MD for:  temperature >100.4   Complete by: As directed    Diet - low sodium heart healthy   Complete by: As directed    Discharge instructions   Complete by: As directed    Please review instructions on the discharge summary.  You were cared for by a hospitalist during your hospital stay. If you have any questions about your discharge medications or the care you received while you were in the hospital after you are discharged, you can call the unit and asked to speak with the hospitalist on call if the hospitalist that took care of you is not available. Once you are discharged, your primary care physician will handle any further medical issues. Please note that NO REFILLS for any discharge medications will be authorized once you are discharged, as it is imperative that you return to your primary care physician (or establish a relationship with a primary care physician if you do not have one) for your aftercare needs so that they can reassess your need for medications and monitor your lab values. If you do not have a primary care physician, you can call [phone] for a physician referral.   Increase activity slowly   Complete by: As directed          Allergies as of 03/25/2024   No Known Allergies      Medication List     STOP taking these medications    amLODipine-valsartan 10-160 MG tablet Commonly known as: EXFORGE   finasteride 5 MG tablet Commonly known as: PROSCAR   FISH OIL PO Replaced  by: omega-3 acid ethyl esters 1 g capsule   furosemide  20 MG tablet Commonly known as: LASIX    sulfamethoxazole-trimethoprim 800-160 MG tablet Commonly known as: BACTRIM DS   traMADol  50 MG tablet Commonly known as: ULTRAM        TAKE these medications    acetaminophen  500 MG tablet Commonly known as: TYLENOL  Take 1 tablet (500 mg total) by mouth 3 (three) times daily.   allopurinol  100 MG tablet Commonly known as: ZYLOPRIM  Take 100 mg by mouth every evening.   aspirin  81 MG tablet Take 81 mg by mouth every evening.   atorvastatin  80 MG tablet Commonly known as: LIPITOR Take 1 tablet (80 mg total) by mouth daily. What changed: when to take this   calcium -vitamin D 500-400 MG-UNIT tablet Commonly known as: OSCAL-500 Take 1 tablet by mouth daily.   carvedilol  3.125 MG tablet Commonly known as: COREG  Take 1 tablet (3.125 mg total) by mouth 2 (two) times daily.   cephALEXin  500 MG capsule Commonly known as: KEFLEX  Take 2 capsules (1,000 mg total) by mouth every 12 (twelve) hours for 4 days.   doxazosin 1 MG tablet Commonly known as: CARDURA Take 2 mg by mouth at bedtime.   Enbrel SureClick 50 MG/ML injection Generic drug: etanercept Inject 50 mg into the skin once a week.   famotidine  20 MG tablet Commonly known as: PEPCID  Take 20 mg by mouth 2 (two) times daily.   feeding supplement Liqd Take 237 mLs by mouth 2 (  two) times daily between meals.   folic acid  1 MG tablet Commonly known as: FOLVITE  Take 2 mg by mouth 2 (two) times daily.   Iron 325 (65 Fe) MG Tabs Take 1 tablet by mouth daily.   leflunomide  20 MG tablet Commonly known as: ARAVA  Take 1 tablet by mouth daily.   levothyroxine  112 MCG tablet Commonly known as: SYNTHROID  Take 1 tablet (112 mcg total) by mouth daily at 6 (six) AM. What changed:  medication strength how much to take when to take this   methotrexate 2.5 MG tablet Commonly known as: RHEUMATREX Take 12.5 mg by mouth once  a week. Caution:Chemotherapy. Protect from light.   omega-3 acid ethyl esters 1 g capsule Commonly known as: LOVAZA  Take 1 capsule (1 g total) by mouth 2 (two) times daily. Replaces: FISH OIL PO   omeprazole 20 MG capsule Commonly known as: PRILOSEC Take 20 mg by mouth daily.   oxybutynin  5 MG tablet Commonly known as: DITROPAN  Take 5 mg by mouth every 8 (eight) hours as needed.   oxyCODONE  5 MG immediate release tablet Commonly known as: Oxy IR/ROXICODONE  Take 1 tablet (5 mg total) by mouth every 6 (six) hours as needed for moderate pain (pain score 4-6).   oxyCODONE  10 mg 12 hr tablet Commonly known as: OXYCONTIN  Take 1 tablet (10 mg total) by mouth every 12 (twelve) hours.   polyethylene glycol 17 g packet Commonly known as: MIRALAX  / GLYCOLAX  Take 17 g by mouth daily as needed for moderate constipation.   PRESERVISION/LUTEIN PO Take 500 mg by mouth in the morning and at bedtime.   senna-docusate 8.6-50 MG tablet Commonly known as: Senokot-S Take 1 tablet by mouth at bedtime.          Contact information for follow-up providers     Ransom Other, MD. Schedule an appointment as soon as possible for a visit in 1 week(s).   Specialty: Internal Medicine Why: post hospitalization follow up Contact information: [address] [place] [address] [phone]         Alvaro Ricardo KATHEE Mickey., MD. Schedule an appointment as soon as possible for a visit in 2 week(s).   Specialty: Urology Why: post hospitalization follow up Contact information: [address] [place] [address] [phone]              Contact information for after-discharge care     Destination     [facility]   Service: Skilled Nursing Contact information: [address] [place]  [phone]                     TOTAL DISCHARGE TIME: 35 minutes  Jerriah Ines  [facility] Pager on  [email or web address]  03/25/2024, 9:45 AM

## 2024-03-25 NOTE — Plan of Care (Signed)
  Daily Progress Note   Patient Name: Connor Hansen       Date: 03/25/2024 DOB: January 24, 1934  Age: 89 y.o. MRN#: [id number] Attending Physician: No att. providers found Primary Care Physician: Ransom Other, MD Admit Date: 03/21/2024 Length of Stay: 4 days  Patient discharged to SNF for rehab today.  Have already placed outpatient palliative medicine referral for patient to follow-up at [facility].  Patient continuing to receive appropriate medications for pain as needed.  Thank you for allowing palliative medicine team to involved with patient's care.  Tinnie Radar, DO Palliative Care Provider PMT # [phone]  No Charge Note

## 2024-03-26 DIAGNOSIS — I5022 Chronic systolic (congestive) heart failure: Secondary | ICD-10-CM | POA: Diagnosis not present

## 2024-03-26 DIAGNOSIS — N179 Acute kidney failure, unspecified: Secondary | ICD-10-CM | POA: Diagnosis not present

## 2024-03-26 DIAGNOSIS — N390 Urinary tract infection, site not specified: Secondary | ICD-10-CM | POA: Diagnosis not present

## 2024-03-26 DIAGNOSIS — A419 Sepsis, unspecified organism: Secondary | ICD-10-CM | POA: Diagnosis not present

## 2024-03-30 ENCOUNTER — Ambulatory Visit (HOSPITAL_COMMUNITY)

## 2024-04-02 DIAGNOSIS — B001 Herpesviral vesicular dermatitis: Secondary | ICD-10-CM | POA: Diagnosis not present

## 2024-04-02 DIAGNOSIS — Z978 Presence of other specified devices: Secondary | ICD-10-CM | POA: Diagnosis not present

## 2024-04-02 DIAGNOSIS — R531 Weakness: Secondary | ICD-10-CM | POA: Diagnosis not present

## 2024-04-04 ENCOUNTER — Inpatient Hospital Stay (HOSPITAL_COMMUNITY)
Admission: EM | Admit: 2024-04-04 | Discharge: 2024-04-12 | DRG: 698 | Disposition: E | Attending: Internal Medicine | Admitting: Internal Medicine

## 2024-04-04 ENCOUNTER — Encounter (HOSPITAL_COMMUNITY): Payer: Self-pay | Admitting: Emergency Medicine

## 2024-04-04 ENCOUNTER — Emergency Department (HOSPITAL_COMMUNITY)

## 2024-04-04 ENCOUNTER — Other Ambulatory Visit: Payer: Self-pay

## 2024-04-04 DIAGNOSIS — Z9181 History of falling: Secondary | ICD-10-CM

## 2024-04-04 DIAGNOSIS — R6520 Severe sepsis without septic shock: Secondary | ICD-10-CM | POA: Diagnosis not present

## 2024-04-04 DIAGNOSIS — I252 Old myocardial infarction: Secondary | ICD-10-CM | POA: Diagnosis not present

## 2024-04-04 DIAGNOSIS — A419 Sepsis, unspecified organism: Principal | ICD-10-CM | POA: Diagnosis present

## 2024-04-04 DIAGNOSIS — G9341 Metabolic encephalopathy: Secondary | ICD-10-CM | POA: Diagnosis present

## 2024-04-04 DIAGNOSIS — N136 Pyonephrosis: Secondary | ICD-10-CM | POA: Diagnosis present

## 2024-04-04 DIAGNOSIS — N1832 Chronic kidney disease, stage 3b: Secondary | ICD-10-CM | POA: Diagnosis present

## 2024-04-04 DIAGNOSIS — Z7982 Long term (current) use of aspirin: Secondary | ICD-10-CM

## 2024-04-04 DIAGNOSIS — E785 Hyperlipidemia, unspecified: Secondary | ICD-10-CM | POA: Diagnosis present

## 2024-04-04 DIAGNOSIS — M109 Gout, unspecified: Secondary | ICD-10-CM | POA: Diagnosis present

## 2024-04-04 DIAGNOSIS — R7881 Bacteremia: Secondary | ICD-10-CM

## 2024-04-04 DIAGNOSIS — E039 Hypothyroidism, unspecified: Secondary | ICD-10-CM | POA: Diagnosis present

## 2024-04-04 DIAGNOSIS — I2510 Atherosclerotic heart disease of native coronary artery without angina pectoris: Secondary | ICD-10-CM | POA: Diagnosis present

## 2024-04-04 DIAGNOSIS — C7951 Secondary malignant neoplasm of bone: Secondary | ICD-10-CM | POA: Diagnosis present

## 2024-04-04 DIAGNOSIS — K219 Gastro-esophageal reflux disease without esophagitis: Secondary | ICD-10-CM | POA: Diagnosis present

## 2024-04-04 DIAGNOSIS — R4182 Altered mental status, unspecified: Secondary | ICD-10-CM

## 2024-04-04 DIAGNOSIS — Z978 Presence of other specified devices: Secondary | ICD-10-CM

## 2024-04-04 DIAGNOSIS — R54 Age-related physical debility: Secondary | ICD-10-CM | POA: Diagnosis present

## 2024-04-04 DIAGNOSIS — M069 Rheumatoid arthritis, unspecified: Secondary | ICD-10-CM | POA: Diagnosis present

## 2024-04-04 DIAGNOSIS — Z7189 Other specified counseling: Secondary | ICD-10-CM

## 2024-04-04 DIAGNOSIS — Z515 Encounter for palliative care: Secondary | ICD-10-CM | POA: Diagnosis not present

## 2024-04-04 DIAGNOSIS — Y846 Urinary catheterization as the cause of abnormal reaction of the patient, or of later complication, without mention of misadventure at the time of the procedure: Secondary | ICD-10-CM | POA: Diagnosis present

## 2024-04-04 DIAGNOSIS — A4151 Sepsis due to Escherichia coli [E. coli]: Secondary | ICD-10-CM | POA: Diagnosis present

## 2024-04-04 DIAGNOSIS — Z781 Physical restraint status: Secondary | ICD-10-CM

## 2024-04-04 DIAGNOSIS — I10 Essential (primary) hypertension: Secondary | ICD-10-CM | POA: Diagnosis present

## 2024-04-04 DIAGNOSIS — R64 Cachexia: Secondary | ICD-10-CM | POA: Diagnosis present

## 2024-04-04 DIAGNOSIS — Z79899 Other long term (current) drug therapy: Secondary | ICD-10-CM

## 2024-04-04 DIAGNOSIS — I5022 Chronic systolic (congestive) heart failure: Secondary | ICD-10-CM | POA: Diagnosis present

## 2024-04-04 DIAGNOSIS — Z87891 Personal history of nicotine dependence: Secondary | ICD-10-CM

## 2024-04-04 DIAGNOSIS — I2489 Other forms of acute ischemic heart disease: Secondary | ICD-10-CM | POA: Diagnosis present

## 2024-04-04 DIAGNOSIS — Z7960 Long term (current) use of unspecified immunomodulators and immunosuppressants: Secondary | ICD-10-CM

## 2024-04-04 DIAGNOSIS — Z9049 Acquired absence of other specified parts of digestive tract: Secondary | ICD-10-CM

## 2024-04-04 DIAGNOSIS — D84821 Immunodeficiency due to drugs: Secondary | ICD-10-CM | POA: Diagnosis present

## 2024-04-04 DIAGNOSIS — N3001 Acute cystitis with hematuria: Secondary | ICD-10-CM | POA: Diagnosis not present

## 2024-04-04 DIAGNOSIS — C61 Malignant neoplasm of prostate: Secondary | ICD-10-CM | POA: Diagnosis present

## 2024-04-04 DIAGNOSIS — T83511A Infection and inflammatory reaction due to indwelling urethral catheter, initial encounter: Principal | ICD-10-CM | POA: Diagnosis present

## 2024-04-04 DIAGNOSIS — N3000 Acute cystitis without hematuria: Secondary | ICD-10-CM | POA: Diagnosis not present

## 2024-04-04 DIAGNOSIS — B9620 Unspecified Escherichia coli [E. coli] as the cause of diseases classified elsewhere: Secondary | ICD-10-CM | POA: Diagnosis not present

## 2024-04-04 DIAGNOSIS — R4589 Other symptoms and signs involving emotional state: Secondary | ICD-10-CM | POA: Diagnosis not present

## 2024-04-04 DIAGNOSIS — Z1152 Encounter for screening for COVID-19: Secondary | ICD-10-CM

## 2024-04-04 DIAGNOSIS — D649 Anemia, unspecified: Secondary | ICD-10-CM | POA: Diagnosis present

## 2024-04-04 DIAGNOSIS — I130 Hypertensive heart and chronic kidney disease with heart failure and stage 1 through stage 4 chronic kidney disease, or unspecified chronic kidney disease: Secondary | ICD-10-CM | POA: Diagnosis present

## 2024-04-04 DIAGNOSIS — Z8546 Personal history of malignant neoplasm of prostate: Secondary | ICD-10-CM

## 2024-04-04 DIAGNOSIS — E8809 Other disorders of plasma-protein metabolism, not elsewhere classified: Secondary | ICD-10-CM | POA: Diagnosis not present

## 2024-04-04 DIAGNOSIS — I255 Ischemic cardiomyopathy: Secondary | ICD-10-CM | POA: Diagnosis present

## 2024-04-04 DIAGNOSIS — E872 Acidosis, unspecified: Secondary | ICD-10-CM | POA: Diagnosis present

## 2024-04-04 DIAGNOSIS — Z66 Do not resuscitate: Secondary | ICD-10-CM | POA: Diagnosis present

## 2024-04-04 DIAGNOSIS — I700 Atherosclerosis of aorta: Secondary | ICD-10-CM | POA: Diagnosis present

## 2024-04-04 DIAGNOSIS — D630 Anemia in neoplastic disease: Secondary | ICD-10-CM | POA: Diagnosis present

## 2024-04-04 DIAGNOSIS — Z6826 Body mass index (BMI) 26.0-26.9, adult: Secondary | ICD-10-CM

## 2024-04-04 DIAGNOSIS — Z79890 Hormone replacement therapy (postmenopausal): Secondary | ICD-10-CM

## 2024-04-04 LAB — CBC WITH DIFFERENTIAL/PLATELET
Abs Immature Granulocytes: 0.05 K/uL (ref 0.00–0.07)
Basophils Absolute: 0.1 K/uL (ref 0.0–0.1)
Basophils Relative: 1 %
Eosinophils Absolute: 0.1 K/uL (ref 0.0–0.5)
Eosinophils Relative: 1 %
HCT: 25.1 % — ABNORMAL LOW (ref 39.0–52.0)
Hemoglobin: 7.6 g/dL — ABNORMAL LOW (ref 13.0–17.0)
Immature Granulocytes: 1 %
Lymphocytes Relative: 8 %
Lymphs Abs: 0.8 K/uL (ref 0.7–4.0)
MCH: 28.1 pg (ref 26.0–34.0)
MCHC: 30.3 g/dL (ref 30.0–36.0)
MCV: 93 fL (ref 80.0–100.0)
Monocytes Absolute: 0.9 K/uL (ref 0.1–1.0)
Monocytes Relative: 9 %
Neutro Abs: 7.6 K/uL (ref 1.7–7.7)
Neutrophils Relative %: 80 %
Platelets: 275 K/uL (ref 150–400)
RBC: 2.7 MIL/uL — ABNORMAL LOW (ref 4.22–5.81)
RDW: 18.4 % — ABNORMAL HIGH (ref 11.5–15.5)
WBC: 9.5 K/uL (ref 4.0–10.5)
nRBC: 0 % (ref 0.0–0.2)

## 2024-04-04 LAB — RESP PANEL BY RT-PCR (RSV, FLU A&B, COVID)  RVPGX2
Influenza A by PCR: NEGATIVE
Influenza B by PCR: NEGATIVE
Resp Syncytial Virus by PCR: NEGATIVE
SARS Coronavirus 2 by RT PCR: NEGATIVE

## 2024-04-04 LAB — BASIC METABOLIC PANEL WITH GFR
Anion gap: 10 (ref 5–15)
BUN: 28 mg/dL — ABNORMAL HIGH (ref 8–23)
CO2: 19 mmol/L — ABNORMAL LOW (ref 22–32)
Calcium: 8.4 mg/dL — ABNORMAL LOW (ref 8.9–10.3)
Chloride: 106 mmol/L (ref 98–111)
Creatinine, Ser: 1.66 mg/dL — ABNORMAL HIGH (ref 0.61–1.24)
GFR, Estimated: 39 mL/min — ABNORMAL LOW (ref >60)
Glucose, Bld: 114 mg/dL — ABNORMAL HIGH (ref 70–99)
Potassium: 4.2 mmol/L (ref 3.5–5.1)
Sodium: 135 mmol/L (ref 135–145)

## 2024-04-04 LAB — I-STAT CG4 LACTIC ACID, ED: Lactic Acid, Venous: 3.1 mmol/L (ref 0.5–1.9)

## 2024-04-04 LAB — URINALYSIS, W/ REFLEX TO CULTURE (INFECTION SUSPECTED)
Bilirubin Urine: NEGATIVE
Glucose, UA: NEGATIVE mg/dL
Ketones, ur: NEGATIVE mg/dL
Nitrite: POSITIVE — AB
Protein, ur: 100 mg/dL — AB
Specific Gravity, Urine: 1.013 (ref 1.005–1.030)
WBC, UA: >50 WBC/hpf (ref 0–5)
pH: 5 (ref 5.0–8.0)

## 2024-04-04 LAB — PROTIME-INR
INR: 1.1 (ref 0.8–1.2)
Prothrombin Time: 14.6 s (ref 11.4–15.2)

## 2024-04-04 LAB — TROPONIN I (HIGH SENSITIVITY): Troponin I (High Sensitivity): 22 ng/L — ABNORMAL HIGH (ref <18)

## 2024-04-04 MED ORDER — LACTATED RINGERS IV BOLUS (SEPSIS)
1000.0000 mL | Freq: Once | INTRAVENOUS | Status: AC
  Administered 2024-04-04: 1000 mL via INTRAVENOUS

## 2024-04-04 MED ORDER — LACTATED RINGERS IV BOLUS (SEPSIS)
500.0000 mL | Freq: Once | INTRAVENOUS | Status: AC
  Administered 2024-04-05: 500 mL via INTRAVENOUS

## 2024-04-04 MED ORDER — ASPIRIN 81 MG PO CHEW: 324.0000 mg | CHEWABLE_TABLET | Freq: Once | ORAL | Status: DC

## 2024-04-04 MED ORDER — VANCOMYCIN HCL 1500 MG/300ML IV SOLN
1500.0000 mg | Freq: Once | INTRAVENOUS | Status: AC
  Administered 2024-04-05: 1500 mg via INTRAVENOUS
  Filled 2024-04-04: qty 300

## 2024-04-04 MED ORDER — ACETAMINOPHEN 325 MG PO TABS
650.0000 mg | ORAL_TABLET | Freq: Once | ORAL | Status: DC
  Filled 2024-04-04: qty 2

## 2024-04-04 MED ORDER — PIPERACILLIN-TAZOBACTAM 3.375 G IVPB 30 MIN
3.3750 g | Freq: Once | INTRAVENOUS | Status: AC
  Administered 2024-04-04: 3.375 g via INTRAVENOUS
  Filled 2024-04-04: qty 50

## 2024-04-04 MED ORDER — HYDROMORPHONE HCL 1 MG/ML IJ SOLN
0.5000 mg | Freq: Once | INTRAMUSCULAR | Status: AC
  Administered 2024-04-04: 0.5 mg via INTRAVENOUS
  Filled 2024-04-04: qty 1

## 2024-04-04 MED ORDER — CHLORHEXIDINE GLUCONATE CLOTH 2 % EX PADS
6.0000 | MEDICATED_PAD | Freq: Every day | CUTANEOUS | Status: DC
  Administered 2024-04-05 – 2024-04-10 (×6): 6 via TOPICAL

## 2024-04-04 MED ORDER — ACETAMINOPHEN 650 MG RE SUPP
650.0000 mg | Freq: Once | RECTAL | Status: AC
  Administered 2024-04-05: 650 mg via RECTAL
  Filled 2024-04-04: qty 1

## 2024-04-04 NOTE — ED Triage Notes (Signed)
 89 y/o male comes in by EMS after family called 911 for a weakness, confusion and a fever. Per EMS, pt was seen here a few weeks ago, sent to rehab, but has been declining since he got home. Pt is alert to self, but disoiritend to time, situation and place. Pt has a foley catheter on arrival; however, pt does not recall when is catheter was placed or why. Pt unable to provide any details regarding today's ER visit

## 2024-04-04 NOTE — Progress Notes (Signed)
 Pt being followed by [facility] for Sepsis protocol.

## 2024-04-04 NOTE — H&P (Incomplete)
 History and Physical  Connor Hansen FMW:[id number] DOB: [birth year] DOA: 04/04/2024  PCP: Ransom Other, MD   Chief Complaint: Confusion, weakness and fever  HPI: Connor Hansen is a 89 y.o. male with medical history significant for HTN, HLD, CAD, CKD 3B, GERD, gout, hypothyroidism, osteoarthritis, rheumatoid arthritis on Enbrel and weekly methotrexate, recently diagnosed prostate cancer, bilateral hydronephrosis s/p indwelling Foley catheter and a recent admission for sepsis and bacteremia who returns to the ED for evaluation of weakness, confusion and fever.  ED Course: Initial vitals show temp to 104.4, RR 28, HR 105, BP 170/80, SpO2 95% on room air. Initial labs significant for bicarb 19, glucose 114, BUN/creatinine 28/1.66, WBC 9.5, Hgb 7.6, lactic acid 3.1, negative flu, RSV and COVID test, UA shows small hemoglobinuria, positive nitrite, large leuks, RBC 11-20, WBC >50 and many bacteria. EKG shows sinus tach. CXR shows no active disease.  Sepsis protocol was activated and patient received IV Dilaudid , IV fluid, IV Zosyn  and IV vancomycin . TRH was consulted for admission.   Review of Systems: Please see HPI for pertinent positives and negatives. A complete 10 system review of systems are otherwise negative.  Past Medical History:  Diagnosis Date   CAD (coronary artery disease)    2/10 - BMS to mid LAD, RCA. Anteroapical akinesis - normal EF. 2010   CKD (chronic kidney disease)    Coronary atherosclerosis of native coronary artery    GERD (gastroesophageal reflux disease)    HTN (hypertension)    Hyperlipidemia    Hypothyroidism    Old myocardial infarct    RA (rheumatoid arthritis) (HCC)    Past Surgical History:  Procedure Laterality Date   CHOLECYSTECTOMY     HEMILAMINOTOMY LUMBAR SPINE     Social History:  reports that he has quit smoking. His smoking use included cigarettes. He has never used smokeless tobacco. He reports that he does not currently use alcohol. He reports  that he does not use drugs.  No Known Allergies  Family History  Problem Relation Age of Onset   Cancer Father      Prior to Admission medications   Medication Sig Start Date End Date Taking? Authorizing Provider  acetaminophen  (TYLENOL ) 500 MG tablet Take 1 tablet (500 mg total) by mouth 3 (three) times daily. 03/25/24   Krishnan, Gokul, MD  allopurinol  (ZYLOPRIM ) 100 MG tablet Take 100 mg by mouth every evening.    [provider]  aspirin  81 MG tablet Take 81 mg by mouth every evening.    [provider]  atorvastatin  (LIPITOR) 80 MG tablet Take 1 tablet (80 mg total) by mouth daily. Patient taking differently: Take 80 mg by mouth every evening. 05/20/23   Lucien Orren SAILOR, PA-C  calcium -vitamin D (OSCAL-500) 500-400 MG-UNIT tablet Take 1 tablet by mouth daily.    [provider]  carvedilol  (COREG ) 3.125 MG tablet Take 1 tablet (3.125 mg total) by mouth 2 (two) times daily. 12/29/23   Lucien Orren SAILOR, PA-C  doxazosin  (CARDURA ) 1 MG tablet Take 2 mg by mouth at bedtime. 11/26/23   [provider]  etanercept (ENBREL SURECLICK) 50 MG/ML injection Inject 50 mg into the skin once a week.    [provider]  famotidine  (PEPCID ) 20 MG tablet Take 20 mg by mouth 2 (two) times daily.    [provider]  feeding supplement (ENSURE PLUS HIGH PROTEIN) LIQD Take 237 mLs by mouth 2 (two) times daily between meals. 03/25/24   Krishnan, Gokul, MD  Ferrous  Sulfate (IRON) 325 (65 Fe) MG TABS Take 1 tablet by mouth daily.    [provider]  folic acid  (FOLVITE ) 1 MG tablet Take 2 mg by mouth 2 (two) times daily.    [provider]  leflunomide  (ARAVA ) 20 MG tablet Take 1 tablet by mouth daily. 01/06/18   [provider]  levothyroxine  (SYNTHROID ) 112 MCG tablet Take 1 tablet (112 mcg total) by mouth daily at 6 (six) AM. 03/25/24   Verdene Purchase, MD  methotrexate (RHEUMATREX) 2.5 MG tablet Take 12.5 mg by mouth once a week.  Caution:Chemotherapy. Protect from light.    [provider]  Multiple Vitamins-Minerals (PRESERVISION/LUTEIN PO) Take 500 mg by mouth in the morning and at bedtime.    [provider]  omega-3 acid ethyl esters (LOVAZA ) 1 g capsule Take 1 capsule (1 g total) by mouth 2 (two) times daily. 03/25/24   Krishnan, Gokul, MD  omeprazole (PRILOSEC) 20 MG capsule Take 20 mg by mouth daily.    [provider]  oxybutynin  (DITROPAN ) 5 MG tablet Take 5 mg by mouth every 8 (eight) hours as needed. 03/17/24   [provider]  oxyCODONE  (OXY IR/ROXICODONE ) 5 MG immediate release tablet Take 1 tablet (5 mg total) by mouth every 6 (six) hours as needed for moderate pain (pain score 4-6). 03/25/24   Krishnan, Gokul, MD  oxyCODONE  (OXYCONTIN ) 10 mg 12 hr tablet Take 1 tablet (10 mg total) by mouth every 12 (twelve) hours. 03/25/24   Krishnan, Gokul, MD  polyethylene glycol (MIRALAX  / GLYCOLAX ) 17 g packet Take 17 g by mouth daily as needed for moderate constipation. 03/25/24   Krishnan, Gokul, MD  senna-docusate (SENOKOT-S) 8.6-50 MG tablet Take 1 tablet by mouth at bedtime. 03/25/24   Verdene Purchase, MD    Physical Exam: BP (!) 170/80 (BP Location: Right Arm)   Pulse (!) 104   Temp (!) 104.4 F (40.2 C) (Rectal)   Resp (!) 23   Ht 5' 9 (1.753 m)   Wt 80 kg   SpO2 95%   BMI 26.05 kg/m  General: Pleasant, well-appearing *** laying in bed. No acute distress. HEENT: [place]/AT. Anicteric sclera CV: RRR. No murmurs, rubs, or gallops. No LE edema Pulmonary: Lungs CTAB. Normal effort. No wheezing or rales. Abdominal: Soft, nontender, nondistended. Normal bowel sounds. Extremities: Palpable radial and DP pulses. Normal ROM. Skin: Warm and dry. No obvious rash or lesions. Neuro: A&Ox3. Moves all extremities. Normal sensation to light touch. No focal deficit. Psych: Normal mood and affect          Labs on Admission:  Basic Metabolic Panel: Recent Labs  Lab 04/04/24 2210  NA 135   K 4.2  CL 106  CO2 19*  GLUCOSE 114*  BUN 28*  CREATININE 1.66*  CALCIUM  8.4*   Liver Function Tests: No results for input(s): AST, ALT, ALKPHOS, BILITOT, PROT, ALBUMIN  in the last 168 hours. No results for input(s): LIPASE, AMYLASE in the last 168 hours. No results for input(s): AMMONIA in the last 168 hours. CBC: Recent Labs  Lab 04/04/24 2210  WBC 9.5  NEUTROABS 7.6  HGB 7.6*  HCT 25.1*  MCV 93.0  PLT 275   Cardiac Enzymes: No results for input(s): CKTOTAL, CKMB, CKMBINDEX, TROPONINI in the last 168 hours. BNP (last 3 results) No results for input(s): BNP in the last 8760 hours.  ProBNP (last 3 results) No results for input(s): PROBNP in the last 8760 hours.  CBG: No results for input(s): GLUCAP in  the last 168 hours.  Radiological Exams on Admission: DG Chest Port 1 View Result Date: 04/04/2024 CLINICAL DATA:  Possible sepsis and weakness EXAM: PORTABLE CHEST 1 VIEW COMPARISON:  03/21/2024 FINDINGS: Cardiac shadow is within normal limits. Lungs are well aerated bilaterally. No focal infiltrate or effusion is seen. No bony abnormality is noted. IMPRESSION: No acute abnormality seen. Electronically Signed   By: Oneil Devonshire M.D.   On: 04/04/2024 22:25   Assessment/Plan Connor Hansen is a 89 y.o. male with medical history significant for HTN, HLD, CAD, CKD 3B, GERD, gout, hypothyroidism, osteoarthritis, rheumatoid arthritis on Enbrel and weekly methotrexate, recently diagnosed prostate cancer, bilateral hydronephrosis s/p indwelling Foley catheter and a recent admission for sepsis and bacteremia who returns to the ED for evaluation of weakness, confusion and fever and admitted for severe sepsis  # Severe sepsis # UTI associated with indwelling Foley catheter  # Acute metabolic encephalopathy  # CKD 3B  # Chronic systolic HF # HTN   # CAD # HLD  # Rheumatoid arthritis  # Hypothyroidism -Continue on Synthroid   #  Gout   DVT prophylaxis: Lovenox      Code Status: Prior  Consults called: None  Family Communication: ***  Severity of Illness: The appropriate patient status for this patient is INPATIENT. Inpatient status is judged to be reasonable and necessary in order to provide the required intensity of service to ensure the patient's safety. The patient's presenting symptoms, physical exam findings, and initial radiographic and laboratory data in the context of their chronic comorbidities is felt to place them at high risk for further clinical deterioration. Furthermore, it is not anticipated that the patient will be medically stable for discharge from the hospital within 2 midnights of admission.   * I certify that at the point of admission it is my clinical judgment that the patient will require inpatient hospital care spanning beyond 2 midnights from the point of admission due to high intensity of service, high risk for further deterioration and high frequency of surveillance required.*  Level of care: Stepdown   This record has been created using Conservation officer, historic buildings. Errors have been sought and corrected, but may not always be located. Such creation errors do not reflect on the standard of care.   Lou Claretta HERO, MD 04/04/2024, 11:59 PM [facility] Pager: [phone] Isaiah 41:10   If 7PM-7AM, please contact night-coverage [email or web address] Password [email or web address]

## 2024-04-04 NOTE — ED Provider Notes (Signed)
 [place] EMERGENCY DEPARTMENT AT [facility] Provider Note   CSN: [id number] Arrival date & time: 04/04/24  2130     Patient presents with: Code Sepsis and Altered Mental Status   Connor Hansen is a 89 y.o. male.   89 year old male brought in by EMS for evaluation of possible sepsis.  Per EMS report family wanted patient evaluated because he has been declining at the nursing home.  Was recently admitted for sepsis from a UTI.  Found to be febrile here.  Patient tracks with eyes and follows commands but does not answer any questions.  Further history limited at this time.   Altered Mental Status      Prior to Admission medications   Medication Sig Start Date End Date Taking? Authorizing Provider  acetaminophen  (TYLENOL ) 500 MG tablet Take 1 tablet (500 mg total) by mouth 3 (three) times daily. 03/25/24   Krishnan, Gokul, MD  allopurinol  (ZYLOPRIM ) 100 MG tablet Take 100 mg by mouth every evening.    [provider]  aspirin  81 MG tablet Take 81 mg by mouth every evening.    [provider]  atorvastatin  (LIPITOR) 80 MG tablet Take 1 tablet (80 mg total) by mouth daily. Patient taking differently: Take 80 mg by mouth every evening. 05/20/23   Lucien Orren SAILOR, PA-C  calcium -vitamin D (OSCAL-500) 500-400 MG-UNIT tablet Take 1 tablet by mouth daily.    [provider]  carvedilol  (COREG ) 3.125 MG tablet Take 1 tablet (3.125 mg total) by mouth 2 (two) times daily. 12/29/23   Lucien Orren SAILOR, PA-C  doxazosin  (CARDURA ) 1 MG tablet Take 2 mg by mouth at bedtime. 11/26/23   [provider]  etanercept (ENBREL SURECLICK) 50 MG/ML injection Inject 50 mg into the skin once a week.    [provider]  famotidine  (PEPCID ) 20 MG tablet Take 20 mg by mouth 2 (two) times daily.    [provider]  feeding supplement (ENSURE PLUS HIGH PROTEIN) LIQD Take 237 mLs by mouth 2 (two) times daily between meals. 03/25/24   Krishnan, Gokul, MD   Ferrous Sulfate  (IRON) 325 (65 Fe) MG TABS Take 1 tablet by mouth daily.    [provider]  folic acid  (FOLVITE ) 1 MG tablet Take 2 mg by mouth 2 (two) times daily.    [provider]  leflunomide  (ARAVA ) 20 MG tablet Take 1 tablet by mouth daily. 01/06/18   [provider]  levothyroxine  (SYNTHROID ) 112 MCG tablet Take 1 tablet (112 mcg total) by mouth daily at 6 (six) AM. 03/25/24   Verdene Purchase, MD  methotrexate (RHEUMATREX) 2.5 MG tablet Take 12.5 mg by mouth once a week. Caution:Chemotherapy. Protect from light.    [provider]  Multiple Vitamins-Minerals (PRESERVISION/LUTEIN PO) Take 500 mg by mouth in the morning and at bedtime.    [provider]  omega-3 acid ethyl esters (LOVAZA ) 1 g capsule Take 1 capsule (1 g total) by mouth 2 (two) times daily. 03/25/24   Krishnan, Gokul, MD  omeprazole (PRILOSEC) 20 MG capsule Take 20 mg by mouth daily.    [provider]  oxybutynin  (DITROPAN ) 5 MG tablet Take 5 mg by mouth every 8 (eight) hours as needed. 03/17/24   [provider]  oxyCODONE  (OXY IR/ROXICODONE ) 5 MG immediate release tablet Take 1 tablet (5 mg total) by mouth every 6 (six) hours as needed for moderate pain (pain score 4-6). 03/25/24   Krishnan, Gokul, MD  oxyCODONE  (OXYCONTIN ) 10 mg  12 hr tablet Take 1 tablet (10 mg total) by mouth every 12 (twelve) hours. 03/25/24   Krishnan, Gokul, MD  polyethylene glycol (MIRALAX  / GLYCOLAX ) 17 g packet Take 17 g by mouth daily as needed for moderate constipation. 03/25/24   Krishnan, Gokul, MD  senna-docusate (SENOKOT-S) 8.6-50 MG tablet Take 1 tablet by mouth at bedtime. 03/25/24   Krishnan, Gokul, MD    Allergies: Patient has no known allergies.    Review of Systems  Reason unable to perform ROS: altered mental status.  All other systems reviewed and are negative.   Updated Vital Signs BP (!) 170/80 (BP Location: Right Arm)   Pulse (!) 104   Temp (!) 104.4 F (40.2 C)  (Rectal)   Resp (!) 23   Ht 5' 9 (1.753 m)   Wt 80 kg   SpO2 95%   BMI 26.05 kg/m   Physical Exam Vitals and nursing note reviewed.  Constitutional:      General: He is not in acute distress.    Appearance: He is well-developed. He is ill-appearing.  HENT:     Head: Normocephalic and atraumatic.  Eyes:     Conjunctiva/sclera: Conjunctivae normal.  Cardiovascular:     Rate and Rhythm: Regular rhythm. Tachycardia present.     Pulses: Normal pulses.     Heart sounds: Normal heart sounds. No murmur heard. Pulmonary:     Effort: Pulmonary effort is normal. No respiratory distress.     Comments: Diminished breath sounds bilaterally  Abdominal:     Palpations: Abdomen is soft.     Tenderness: There is no abdominal tenderness.  Musculoskeletal:        General: No swelling.     Cervical back: Neck supple.  Skin:    General: Skin is warm and dry.     Capillary Refill: Capillary refill takes less than 2 seconds.  Neurological:     Mental Status: He is alert.  Psychiatric:        Mood and Affect: Mood normal.     (all labs ordered are listed, but only abnormal results are displayed) Labs Reviewed  BASIC METABOLIC PANEL WITH GFR - Abnormal; Notable for the following components:      Result Value   CO2 19 (*)    Glucose, Bld 114 (*)    BUN 28 (*)    Creatinine, Ser 1.66 (*)    Calcium  8.4 (*)    GFR, Estimated 39 (*)    All other components within normal limits  CBC WITH DIFFERENTIAL/PLATELET - Abnormal; Notable for the following components:   RBC 2.70 (*)    Hemoglobin 7.6 (*)    HCT 25.1 (*)    RDW 18.4 (*)    All other components within normal limits  URINALYSIS, W/ REFLEX TO CULTURE (INFECTION SUSPECTED) - Abnormal; Notable for the following components:   APPearance HAZY (*)    Hgb urine dipstick SMALL (*)    Protein, ur 100 (*)    Nitrite POSITIVE (*)    Leukocytes,Ua LARGE (*)    Bacteria, UA MANY (*)    All other components within normal limits  I-STAT CG4  LACTIC ACID, ED - Abnormal; Notable for the following components:   Lactic Acid, Venous 3.1 (*)    All other components within normal limits  TROPONIN I (HIGH SENSITIVITY) - Abnormal; Notable for the following components:   Troponin I (High Sensitivity) 22 (*)    All other components within normal limits  RESP PANEL BY RT-PCR (  RSV, FLU A&B, COVID)  RVPGX2  CULTURE, BLOOD (ROUTINE X 2)  CULTURE, BLOOD (ROUTINE X 2)  URINE CULTURE  PROTIME-INR    EKG: EKG Interpretation Date/Time:  Sunday April 04 2024 21:57:30 EDT Ventricular Rate:  103 PR Interval:  162 QRS Duration:  97 QT Interval:  344 QTC Calculation: 451 R Axis:   -78  Text Interpretation: Sinus tachycardia Anterolateral infarct, old Compared with prior EKG from 03/21/2024 Confirmed by Gennaro Bouchard ([id number]) on 04/04/2024 10:01:07 PM  Radiology: [facility] Chest Port 1 View Result Date: 04/04/2024 CLINICAL DATA:  Possible sepsis and weakness EXAM: PORTABLE CHEST 1 VIEW COMPARISON:  03/21/2024 FINDINGS: Cardiac shadow is within normal limits. Lungs are well aerated bilaterally. No focal infiltrate or effusion is seen. No bony abnormality is noted. IMPRESSION: No acute abnormality seen. Electronically Signed   By: Oneil Devonshire M.D.   On: 04/04/2024 22:25     Procedures   Medications Ordered in the ED  lactated ringers  bolus 1,000 mL (1,000 mLs Intravenous New Bag/Given 04/04/24 2237)    And  lactated ringers  bolus 1,000 mL (has no administration in time range)    And  lactated ringers  bolus 500 mL (has no administration in time range)  vancomycin  (VANCOREADY) IVPB 1500 mg/300 mL (has no administration in time range)  piperacillin -tazobactam (ZOSYN ) IVPB 3.375 g (has no administration in time range)  acetaminophen  (TYLENOL ) suppository 650 mg (has no administration in time range)  aspirin  chewable tablet 324 mg (has no administration in time range)  HYDROmorphone  (DILAUDID ) injection 0.5 mg (0.5 mg Intravenous Given 04/04/24 2311)                                     Medical Decision Making Cardiac monitor interpretation: Sinus tachycardia, no ectopy  89 year old male here for sepsis.  Found to be from UTI.  He has an elevated lactate, is febrile and tachycardic.  Was given 30 cc/kg IV fluids and started on broad-spectrum antibiotics.  Blood pressure is stable.  I discussed all the results with patient and family at bedside.  He was recently here for similar.  Discussed patient's case with hospitalist and patient will be admitted for further workup and management.  Problems Addressed: Acute cystitis without hematuria: acute illness or injury Altered mental status, unspecified altered mental status type: undiagnosed new problem with uncertain prognosis Sepsis, due to unspecified organism, unspecified whether acute organ dysfunction present [facility] [place]): acute illness or injury that poses a threat to life or bodily functions  Amount and/or Complexity of Data Reviewed Independent Historian: EMS    Details: History obtained from EMS and family at bedside.  Patient is nonverbal at this time External Data Reviewed: notes.    Details: Prior Hospital records reviewed and patient was recently admitted for urinary tract infection with sepsis Labs: ordered. Decision-making details documented in ED Course.    Details: Ordered and reviewed by me and patient has a leukocytosis, evidence of UTI and elevated lactic acid Radiology: ordered and independent interpretation performed. Decision-making details documented in ED Course.    Details: Ordered and interpreted by me independently radiology Chest x-ray: Shows no acute abnormality in the chest ECG/medicine tests: ordered and independent interpretation performed. Decision-making details documented in ED Course.    Details: Ordered and interpreted by me in the absence of cardiology and shows a sinus tachycardia, no STEMI or acute change when compared to prior EKG  Risk OTC  drugs.  Prescription drug management. Parenteral controlled substances. Drug therapy requiring intensive monitoring for toxicity. Decision regarding hospitalization. Risk Details: CRITICAL CARE Performed by: Duwaine LITTIE Fusi   Total critical care time: 39 minutes  Critical care time was exclusive of separately billable procedures and treating other patients.  Critical care was necessary to treat or prevent imminent or life-threatening deterioration.  Critical care was time spent personally by me on the following activities: development of treatment plan with patient and/or surrogate as well as nursing, discussions with consultants, evaluation of patient's response to treatment, examination of patient, obtaining history from patient or surrogate, ordering and performing treatments and interventions, ordering and review of laboratory studies, ordering and review of radiographic studies, pulse oximetry and re-evaluation of patient's condition.   Critical Care Total time providing critical care: 39 minutes     Final diagnoses:  Sepsis, due to unspecified organism, unspecified whether acute organ dysfunction present (HCC)  Altered mental status, unspecified altered mental status type  Acute cystitis without hematuria    ED Discharge Orders     None          Fusi Duwaine LITTIE, DO 04/04/24 2322

## 2024-04-05 ENCOUNTER — Inpatient Hospital Stay (HOSPITAL_COMMUNITY)

## 2024-04-05 DIAGNOSIS — C61 Malignant neoplasm of prostate: Secondary | ICD-10-CM

## 2024-04-05 DIAGNOSIS — G9341 Metabolic encephalopathy: Secondary | ICD-10-CM

## 2024-04-05 DIAGNOSIS — R4182 Altered mental status, unspecified: Secondary | ICD-10-CM

## 2024-04-05 DIAGNOSIS — A419 Sepsis, unspecified organism: Secondary | ICD-10-CM | POA: Diagnosis not present

## 2024-04-05 DIAGNOSIS — N3001 Acute cystitis with hematuria: Secondary | ICD-10-CM

## 2024-04-05 DIAGNOSIS — R6520 Severe sepsis without septic shock: Secondary | ICD-10-CM | POA: Diagnosis not present

## 2024-04-05 LAB — BLOOD CULTURE ID PANEL (REFLEXED) - BCID2

## 2024-04-05 LAB — CBC
HCT: 24 % — ABNORMAL LOW (ref 39.0–52.0)
Hemoglobin: 7.6 g/dL — ABNORMAL LOW (ref 13.0–17.0)
MCH: 29 pg (ref 26.0–34.0)
MCHC: 31.7 g/dL (ref 30.0–36.0)
MCV: 91.6 fL (ref 80.0–100.0)
Platelets: 220 K/uL (ref 150–400)
RBC: 2.62 MIL/uL — ABNORMAL LOW (ref 4.22–5.81)
RDW: 18.4 % — ABNORMAL HIGH (ref 11.5–15.5)
WBC: 11.6 K/uL — ABNORMAL HIGH (ref 4.0–10.5)
nRBC: 0 % (ref 0.0–0.2)

## 2024-04-05 LAB — TROPONIN I (HIGH SENSITIVITY): Troponin I (High Sensitivity): 112 ng/L (ref <18)

## 2024-04-05 LAB — BASIC METABOLIC PANEL WITH GFR
Anion gap: 8 (ref 5–15)
BUN: 26 mg/dL — ABNORMAL HIGH (ref 8–23)
CO2: 20 mmol/L — ABNORMAL LOW (ref 22–32)
Calcium: 8.2 mg/dL — ABNORMAL LOW (ref 8.9–10.3)
Chloride: 110 mmol/L (ref 98–111)
Creatinine, Ser: 1.64 mg/dL — ABNORMAL HIGH (ref 0.61–1.24)
GFR, Estimated: 40 mL/min — ABNORMAL LOW (ref >60)
Glucose, Bld: 105 mg/dL — ABNORMAL HIGH (ref 70–99)
Potassium: 4.2 mmol/L (ref 3.5–5.1)
Sodium: 138 mmol/L (ref 135–145)

## 2024-04-05 LAB — PREPARE RBC (CROSSMATCH)

## 2024-04-05 LAB — MRSA NEXT GEN BY PCR, NASAL: MRSA by PCR Next Gen: NOT DETECTED

## 2024-04-05 LAB — HEMOGLOBIN AND HEMATOCRIT, BLOOD
HCT: 23.4 % — ABNORMAL LOW (ref 39.0–52.0)
Hemoglobin: 7.9 g/dL — ABNORMAL LOW (ref 13.0–17.0)

## 2024-04-05 LAB — ABO/RH: ABO/RH(D): B POS

## 2024-04-05 MED ORDER — SODIUM CHLORIDE 0.9 % IV SOLN
2.0000 g | INTRAVENOUS | Status: DC
  Administered 2024-04-06 – 2024-04-10 (×5): 2 g via INTRAVENOUS
  Filled 2024-04-05 (×5): qty 20

## 2024-04-05 MED ORDER — ACETAMINOPHEN 650 MG RE SUPP
650.0000 mg | Freq: Four times a day (QID) | RECTAL | Status: DC | PRN
Start: 2024-04-05 — End: 2024-04-11
  Filled 2024-04-05: qty 1

## 2024-04-05 MED ORDER — SENNOSIDES-DOCUSATE SODIUM 8.6-50 MG PO TABS: 1.0000 | ORAL_TABLET | Freq: Every evening | ORAL | Status: DC | PRN

## 2024-04-05 MED ORDER — ONDANSETRON HCL 4 MG PO TABS
4.0000 mg | ORAL_TABLET | Freq: Four times a day (QID) | ORAL | Status: DC | PRN
  Administered 2024-04-10: 4 mg via ORAL
  Filled 2024-04-05: qty 1

## 2024-04-05 MED ORDER — ALLOPURINOL 100 MG PO TABS
100.0000 mg | ORAL_TABLET | Freq: Every evening | ORAL | Status: DC
  Administered 2024-04-05 – 2024-04-10 (×6): 100 mg via ORAL
  Filled 2024-04-05 (×6): qty 1

## 2024-04-05 MED ORDER — PROSIGHT PO TABS
1.0000 | ORAL_TABLET | Freq: Two times a day (BID) | ORAL | Status: DC
  Administered 2024-04-05 – 2024-04-10 (×12): 1 via ORAL
  Filled 2024-04-05 (×12): qty 1

## 2024-04-05 MED ORDER — LACTATED RINGERS IV SOLN: INTRAVENOUS | Status: DC

## 2024-04-05 MED ORDER — DOXAZOSIN MESYLATE 2 MG PO TABS
2.0000 mg | ORAL_TABLET | Freq: Every day | ORAL | Status: DC
Start: 2024-04-05 — End: 2024-04-11
  Administered 2024-04-05 – 2024-04-10 (×6): 2 mg via ORAL
  Filled 2024-04-05 (×5): qty 1
  Filled 2024-04-05: qty 2

## 2024-04-05 MED ORDER — ENOXAPARIN SODIUM 40 MG/0.4ML IJ SOSY
40.0000 mg | PREFILLED_SYRINGE | INTRAMUSCULAR | Status: DC
  Administered 2024-04-05: 40 mg via SUBCUTANEOUS
  Filled 2024-04-05: qty 0.4

## 2024-04-05 MED ORDER — SODIUM CHLORIDE 0.9 % IV SOLN
1.0000 g | Freq: Once | INTRAVENOUS | Status: AC
  Administered 2024-04-05: 1 g via INTRAVENOUS
  Filled 2024-04-05: qty 10

## 2024-04-05 MED ORDER — ENSURE PLUS HIGH PROTEIN PO LIQD
237.0000 mL | Freq: Two times a day (BID) | ORAL | Status: DC
  Administered 2024-04-05 – 2024-04-10 (×7): 237 mL via ORAL

## 2024-04-05 MED ORDER — OXYCODONE HCL 5 MG PO TABS
5.0000 mg | ORAL_TABLET | Freq: Four times a day (QID) | ORAL | Status: DC | PRN
  Administered 2024-04-05: 5 mg via ORAL
  Filled 2024-04-05: qty 1

## 2024-04-05 MED ORDER — ALBUMIN HUMAN 25 % IV SOLN
25.0000 g | Freq: Four times a day (QID) | INTRAVENOUS | Status: AC
  Administered 2024-04-05 (×2): 25 g via INTRAVENOUS
  Administered 2024-04-05: 12.5 g via INTRAVENOUS
  Administered 2024-04-06: 25 g via INTRAVENOUS
  Filled 2024-04-05 (×4): qty 100

## 2024-04-05 MED ORDER — LEFLUNOMIDE 10 MG PO TABS
20.0000 mg | ORAL_TABLET | Freq: Every day | ORAL | Status: DC
  Administered 2024-04-05: 20 mg via ORAL
  Filled 2024-04-05 (×2): qty 2

## 2024-04-05 MED ORDER — ATORVASTATIN CALCIUM 40 MG PO TABS
80.0000 mg | ORAL_TABLET | Freq: Every evening | ORAL | Status: DC
  Administered 2024-04-05 – 2024-04-10 (×6): 80 mg via ORAL
  Filled 2024-04-05 (×6): qty 2

## 2024-04-05 MED ORDER — BISACODYL 5 MG PO TBEC: 5.0000 mg | DELAYED_RELEASE_TABLET | Freq: Every day | ORAL | Status: DC | PRN

## 2024-04-05 MED ORDER — HYDROMORPHONE HCL 1 MG/ML IJ SOLN
0.5000 mg | Freq: Four times a day (QID) | INTRAMUSCULAR | Status: DC | PRN
  Administered 2024-04-08: 0.5 mg via INTRAVENOUS
  Filled 2024-04-05: qty 0.5

## 2024-04-05 MED ORDER — SODIUM CHLORIDE 0.9% IV SOLUTION: Freq: Once | INTRAVENOUS | Status: AC

## 2024-04-05 MED ORDER — LEVOTHYROXINE SODIUM 112 MCG PO TABS
112.0000 ug | ORAL_TABLET | Freq: Every day | ORAL | Status: DC
  Administered 2024-04-05 – 2024-04-10 (×6): 112 ug via ORAL
  Filled 2024-04-05 (×6): qty 1

## 2024-04-05 MED ORDER — OXYCODONE HCL ER 10 MG PO T12A
10.0000 mg | EXTENDED_RELEASE_TABLET | Freq: Two times a day (BID) | ORAL | Status: DC
  Administered 2024-04-05 – 2024-04-10 (×13): 10 mg via ORAL
  Filled 2024-04-05 (×13): qty 1

## 2024-04-05 MED ORDER — HYDRALAZINE HCL 20 MG/ML IJ SOLN: 10.0000 mg | Freq: Three times a day (TID) | INTRAMUSCULAR | Status: DC | PRN

## 2024-04-05 MED ORDER — ONDANSETRON HCL 4 MG/2ML IJ SOLN
4.0000 mg | Freq: Four times a day (QID) | INTRAMUSCULAR | Status: DC | PRN
  Administered 2024-04-05: 4 mg via INTRAVENOUS
  Filled 2024-04-05: qty 2

## 2024-04-05 MED ORDER — SODIUM CHLORIDE 0.9 % IV SOLN
1.0000 g | INTRAVENOUS | Status: DC
  Administered 2024-04-05: 1 g via INTRAVENOUS
  Filled 2024-04-05: qty 10

## 2024-04-05 MED ORDER — SENNOSIDES-DOCUSATE SODIUM 8.6-50 MG PO TABS
1.0000 | ORAL_TABLET | Freq: Every day | ORAL | Status: DC
  Administered 2024-04-05 – 2024-04-09 (×5): 1 via ORAL
  Filled 2024-04-05 (×5): qty 1

## 2024-04-05 MED ORDER — ACETAMINOPHEN 325 MG PO TABS
650.0000 mg | ORAL_TABLET | Freq: Four times a day (QID) | ORAL | Status: DC | PRN
  Administered 2024-04-05 – 2024-04-06 (×4): 650 mg via ORAL
  Filled 2024-04-05 (×4): qty 2

## 2024-04-05 MED ORDER — ORAL CARE MOUTH RINSE: 15.0000 mL | OROMUCOSAL | Status: DC | PRN

## 2024-04-05 MED ORDER — ASPIRIN 81 MG PO CHEW
81.0000 mg | CHEWABLE_TABLET | Freq: Every evening | ORAL | Status: DC
  Administered 2024-04-05 – 2024-04-10 (×6): 81 mg via ORAL
  Filled 2024-04-05 (×6): qty 1

## 2024-04-05 MED ORDER — CARVEDILOL 3.125 MG PO TABS
3.1250 mg | ORAL_TABLET | Freq: Two times a day (BID) | ORAL | Status: DC
  Administered 2024-04-05 (×2): 3.125 mg via ORAL
  Filled 2024-04-05 (×2): qty 1

## 2024-04-05 NOTE — Progress Notes (Signed)
 PROGRESS NOTE    CAVIN LONGMAN  FMW:[id number] DOB: 1933-09-13 DOA: 04/04/2024 PCP: Ransom Other, MD   Brief Narrative: 89 y.o. male with medical history significant for HTN, HLD, CAD, CKD 3B, GERD, gout, hypothyroidism, osteoarthritis, rheumatoid arthritis on Enbrel and weekly methotrexate, recently diagnosed prostate cancer, bilateral hydronephrosis s/p indwelling Foley catheter and a recent admission for sepsis and bacteremia who returns to the ED for evaluation of weakness, confusion and fever. Per son, patient did very well at rehab and was progressing both physically and neurologically, spent 9 days there and was discharged 8/24.  On the day of admission he suddenly started having fevers as high as 102, chills, weakness and confusion.  Reports patient also had an episode of nausea and vomiting at home and continues to endorse mild abdominal discomfort but no headache, dizziness, shortness of breath, chest pain or change in appetite.  Patient has also had intermittent tremors of the right hand.  He was due to follow-up with Dr. Alvaro on 04/08/2024. He was admitted to the hospital 03/21/2024 through 03/25/2024 with E. coli UTI and bacteremia and was discharged on Keflex .  CT abdomen at that time showed bilateral hydronephrosis and diffuse bone mets.   ED Course: Initial vitals show temp to 104.4, RR 28, HR 105, BP 170/80, SpO2 95% on room air. Initial labs significant for bicarb 19, glucose 114, BUN/creatinine 28/1.66, WBC 9.5, Hgb 7.6, lactic acid 3.1, negative flu, RSV and COVID test, UA shows small hemoglobinuria, positive nitrite, large leuks, RBC 11-20, WBC >50 and many bacteria. EKG shows sinus tach. CXR shows no active disease.  Sepsis protocol was activated and patient received IV Dilaudid , IV fluid, IV Zosyn  and IV vancomycin . [facility] was consulted for admission.    Assessment & Plan:   Principal Problem:   Severe sepsis (HCC) Active Problems:   Acute cystitis with hematuria   Acute  metabolic encephalopathy   Altered mental status   Prostate cancer metastatic to bone (HCC)  # Severe sepsis # UTI associated with indwelling Foley catheter - Patient with recent hospitalization for sepsis secondary to UTI and bacteremia now presenting with acute onset of fevers, confusion and weakness after returning from rehab facility - UA shows persistent signs of infection - Patient weak-appearing, alert and oriented x 2, hemodynamically stable - Met severe sepsis criteria with fever, tachypnea, lactic acidosis, altered mental status and evidence of urinary infection - Recent urine culture grew E. coli susceptible to cephalosporins He was started on Rocephin  continue follow-up cultures.  Pressure still soft 116/45.  Holding Coreg .  He was on Ringer lactate 125 cc an hour he had bilateral trace pitting edema last echo 40 to 45% ejection fraction.  IV fluids stopped especially since he is going to get albumin  infusion and a unit of packed RBC.   # Acute metabolic encephalopathy likely secondary to above.  At baseline he is awake alert oriented able to follow conversation.  It is improving.  Today he was more awake than yesterday he was able to follow commands and follow conversation.    # Metastatic prostate cancer # Bilateral hydronephrosis s/p chronic Foley - CT on recent admission showed bilateral hydronephrosis and diffuse bone mets - Foley catheter changed on 8/14 per family - Continue Foley catheter - Repeat CT abdomen and pelvis 04/05/2024 pending   # Chronic systolic HF # Ischemic cardiomyopathy # HTN - Last TTE showed EF of 40-45% -Coreg  held due to soft BP Currently also on doxazosin  will monitor (wonder if he  still needs doxazosin  in the setting of having a chronic Foley would check with urology.) -Amlodipine and valsartan on hold   # CKD 3B Cr 1.64  - Trend renal function, avoid nephrotoxic agents   # Normocytic anemia - Hgb of 7.6 slightly lower than recent baseline  of 8-9 - Trend CBC  - Transfuse 1 unit of packed RBC  # CAD # HLD - Continue atorvastatin  and aspirin    # Rheumatoid arthritis - Continue leflunomide    # Hypothyroidism -Continue on Synthroid    # Gout - Continue allopurinol    Estimated body mass index is 26.05 kg/m as calculated from the following:   Height as of this encounter: 5' 9 (1.753 m).   Weight as of this encounter: 80 kg.  DVT prophylaxis: Lovenox   code Status: D NR  family Communication: None at bedside Disposition Plan:  Status is: Inpatient Remains inpatient appropriate because: Acute illness   Consultants:  Discussed with urology  Procedures: None  Antimicrobials: Rocephin   Subjective: He feels improved compared to yesterday did not sleep well eyes pain nausea vomiting abdominal pain diarrhea cough shortness of breath has Foley in place draining clear urine  Objective: Vitals:   04/05/24 0700 04/05/24 0747 04/05/24 0800 04/05/24 0900  BP: 131/61  (!) 138/46 (!) 116/45  Pulse: 92  93 92  Resp: (!) 8  16 17   Temp:  99.8 F (37.7 C)    TempSrc:  Oral    SpO2: 91%  93% 91%  Weight:      Height:        Intake/Output Summary (Last 24 hours) at 04/05/2024 0935 Last data filed at 04/05/2024 0900 Gross per 24 hour  Intake 4330.16 ml  Output 1300 ml  Net 3030.16 ml   Filed Weights   04/04/24 2151  Weight: 80 kg    Examination:  General exam: Appears no acute distress  respiratory system: Respiratory effort normal. Cardiovascular system: Tachycardic Gastrointestinal system: Abdomen is distended, soft and nontender. No organomegaly or masses felt. Normal bowel sounds heard. Central nervous system: Alert and oriented. No focal neurological deficits. Extremities: trace Bilateral pitting edema   Data Reviewed: I have personally reviewed following labs and imaging studies  CBC: Recent Labs  Lab 04/04/24 2210 04/05/24 0209  WBC 9.5 11.6*  NEUTROABS 7.6  --   HGB 7.6* 7.6*  HCT 25.1*  24.0*  MCV 93.0 91.6  PLT 275 220   Basic Metabolic Panel: Recent Labs  Lab 04/04/24 2210 04/05/24 0209  NA 135 138  K 4.2 4.2  CL 106 110  CO2 19* 20*  GLUCOSE 114* 105*  BUN 28* 26*  CREATININE 1.66* 1.64*  CALCIUM  8.4* 8.2*   GFR: Estimated Creatinine Clearance: 31.1 mL/min (A) (by C-G formula based on SCr of 1.64 mg/dL (H)). Liver Function Tests: No results for input(s): AST, ALT, ALKPHOS, BILITOT, PROT, ALBUMIN  in the last 168 hours. No results for input(s): LIPASE, AMYLASE in the last 168 hours. No results for input(s): AMMONIA in the last 168 hours. Coagulation Profile: Recent Labs  Lab 04/04/24 2210  INR 1.1   Cardiac Enzymes: No results for input(s): CKTOTAL, CKMB, CKMBINDEX, TROPONINI in the last 168 hours. BNP (last 3 results) No results for input(s): PROBNP in the last 8760 hours. HbA1C: No results for input(s): HGBA1C in the last 72 hours. CBG: No results for input(s): GLUCAP in the last 168 hours. Lipid Profile: No results for input(s): CHOL, HDL, LDLCALC, TRIG, CHOLHDL, LDLDIRECT in the last 72 hours. Thyroid  Function Tests:  No results for input(s): TSH, T4TOTAL, FREET4, T3FREE, THYROIDAB in the last 72 hours. Anemia Panel: No results for input(s): VITAMINB12, FOLATE, FERRITIN, TIBC, IRON, RETICCTPCT in the last 72 hours. Sepsis Labs: Recent Labs  Lab 04/04/24 2219  LATICACIDVEN 3.1*    Recent Results (from the past 240 hours)  Culture, blood (routine x 2)     Status: None (Preliminary result)   Collection Time: 04/04/24  4:35 AM   Specimen: BLOOD  Result Value Ref Range Status   Specimen Description   Final    BLOOD SITE NOT SPECIFIED Performed at [facility], [address], [place], [place] [address]    Special Requests   Final    BOTTLES DRAWN AEROBIC AND ANAEROBIC Blood Culture results may not be optimal due to an inadequate volume of blood received in  culture bottles Performed at [facility], [address], [place], [place] [address]    Culture   Final    NO GROWTH <12 HOURS Performed at [facility], [address], [place], [place] [address]    Report Status PENDING  Incomplete  Resp panel by RT-PCR (RSV, Flu A&B, Covid) Anterior Nasal Swab     Status: None   Collection Time: 04/04/24 10:28 PM   Specimen: Anterior Nasal Swab  Result Value Ref Range Status   SARS Coronavirus 2 by RT PCR NEGATIVE NEGATIVE Final    Comment: (NOTE) SARS-CoV-2 target nucleic acids are NOT DETECTED.  The SARS-CoV-2 RNA is generally detectable in upper respiratory specimens during the acute phase of infection. The lowest concentration of SARS-CoV-2 viral copies this assay can detect is 138 copies/mL. A negative result does not preclude SARS-Cov-2 infection and should not be used as the sole basis for treatment or other patient management decisions. A negative result may occur with  improper specimen collection/handling, submission of specimen other than nasopharyngeal swab, presence of viral mutation(s) within the areas targeted by this assay, and inadequate number of viral copies(<138 copies/mL). A negative result must be combined with clinical observations, patient history, and epidemiological information. The expected result is Negative.  Fact Sheet for Patients:  BloggerCourse.com  Fact Sheet for Healthcare Providers:  SeriousBroker.it  This test is no t yet approved or cleared by the [place]  FDA and  has been authorized for detection and/or diagnosis of SARS-CoV-2 by FDA under an Emergency Use Authorization (EUA). This EUA will remain  in effect (meaning this test can be used) for the duration of the COVID-19 declaration under Section 564(b)(1) of the Act, 21 U.S.C.section 360bbb-3(b)(1), unless the authorization is terminated  or revoked sooner.        Influenza A by PCR NEGATIVE NEGATIVE Final   Influenza B by PCR NEGATIVE NEGATIVE Final    Comment: (NOTE) The Xpert Xpress SARS-CoV-2/FLU/RSV plus assay is intended as an aid in the diagnosis of influenza from Nasopharyngeal swab specimens and should not be used as a sole basis for treatment. Nasal washings and aspirates are unacceptable for Xpert Xpress SARS-CoV-2/FLU/RSV testing.  Fact Sheet for Patients: BloggerCourse.com  Fact Sheet for Healthcare Providers: SeriousBroker.it  This test is not yet approved or cleared by the [place]  FDA and has been authorized for detection and/or diagnosis of SARS-CoV-2 by FDA under an Emergency Use Authorization (EUA). This EUA will remain in effect (meaning this test can be used) for the duration of the COVID-19 declaration under Section 564(b)(1) of the Act, 21 U.S.C. section 360bbb-3(b)(1), unless the authorization is terminated or  revoked.     Resp Syncytial Virus by PCR NEGATIVE NEGATIVE Final    Comment: (NOTE) Fact Sheet for Patients: BloggerCourse.com  Fact Sheet for Healthcare Providers: SeriousBroker.it  This test is not yet approved or cleared by the [place]  FDA and has been authorized for detection and/or diagnosis of SARS-CoV-2 by FDA under an Emergency Use Authorization (EUA). This EUA will remain in effect (meaning this test can be used) for the duration of the COVID-19 declaration under Section 564(b)(1) of the Act, 21 U.S.C. section 360bbb-3(b)(1), unless the authorization is terminated or revoked.  Performed at [facility], [address], [place], [place] [address]   Culture, blood (routine x 2)     Status: None (Preliminary result)   Collection Time: 04/04/24 11:08 PM   Specimen: BLOOD  Result Value Ref Range Status   Specimen Description   Final    BLOOD BLOOD LEFT HAND Performed  at [facility], [address], [place], [place] [address]    Special Requests   Final    BOTTLES DRAWN AEROBIC AND ANAEROBIC Blood Culture results may not be optimal due to an inadequate volume of blood received in culture bottles Performed at [facility], [address] [place], [place] [address]    Culture   Final    NO GROWTH < 12 HOURS Performed at [facility], [address] [place], [place] [address]    Report Status PENDING  Incomplete  MRSA Next Gen by PCR, Nasal     Status: None   Collection Time: 04/05/24 12:55 AM   Specimen: Nasal Mucosa; Nasal Swab  Result Value Ref Range Status   MRSA by PCR Next Gen NOT DETECTED NOT DETECTED Final    Comment: (NOTE) The GeneXpert MRSA Assay (FDA approved for NASAL specimens only), is one component of a comprehensive MRSA colonization surveillance program. It is not intended to diagnose MRSA infection nor to guide or monitor treatment for MRSA infections. Test performance is not FDA approved in patients less than 9 years old. Performed at [facility], [address], [place], [place] [address]      Radiology Studies: [facility] Chest Port 1 View Result Date: 04/04/2024 CLINICAL DATA:  Possible sepsis and weakness EXAM: PORTABLE CHEST 1 VIEW COMPARISON:  03/21/2024 FINDINGS: Cardiac shadow is within normal limits. Lungs are well aerated bilaterally. No focal infiltrate or effusion is seen. No bony abnormality is noted. IMPRESSION: No acute abnormality seen. Electronically Signed   By: Oneil Devonshire M.D.   On: 04/04/2024 22:25    Scheduled Meds:  allopurinol   100 mg Oral QPM   aspirin   81 mg Oral QPM   atorvastatin   80 mg Oral QPM   Chlorhexidine  Gluconate Cloth  6 each Topical Daily   doxazosin   2 mg Oral QHS   enoxaparin  (LOVENOX ) injection  40 mg Subcutaneous Q24H   feeding supplement  237 mL Oral BID BM   leflunomide   20 mg Oral Daily   levothyroxine   112 mcg Oral  Q0600   multivitamin  1 tablet Oral BID   oxyCODONE   10 mg Oral Q12H   senna-docusate  1 tablet Oral QHS   Continuous Infusions:  cefTRIAXone  (ROCEPHIN )  IV     lactated ringers  125 mL/hr at 04/05/24 0900     LOS: 1 day    Almarie KANDICE Hoots, MD 04/05/2024, 9:35 AM

## 2024-04-05 NOTE — Progress Notes (Signed)
 PHARMACY - PHYSICIAN COMMUNICATION CRITICAL VALUE ALERT - BLOOD CULTURE IDENTIFICATION (BCID)  Connor Hansen is an 89 y.o. male who presented to [facility] on 04/04/2024 with a chief complaint of bilateral hydronephrosis s/p indwelling Foley catheter and a recent admission for sepsis and bacteremia.  Previous cxt:  E. coli UTI and bacteremia (8/11) Treatment: Zosyn  (8/10-8/11) > Ceftriaxone  (8/11 > 8/12) > Cephalexin  (8/13 >8/17)  Assessment:   8/24 BCx: GNR 8/25 BCID: Escherichia coli  Name of physician (or Provider) Contacted: Dr Alvia  Current antibiotics: Ceftriaxone  2g IV q24h   Changes to prescribed antibiotics recommended:  Patient is on recommended antibiotics - No changes needed  Results for orders placed or performed during the hospital encounter of 04/04/24  Blood Culture ID Panel (Reflexed) (Collected: 04/04/2024 11:08 PM)  Result Value Ref Range   Enterococcus faecalis NOT DETECTED NOT DETECTED   Enterococcus Faecium NOT DETECTED NOT DETECTED   Listeria monocytogenes NOT DETECTED NOT DETECTED   Staphylococcus species NOT DETECTED NOT DETECTED   Staphylococcus aureus (BCID) NOT DETECTED NOT DETECTED   Staphylococcus epidermidis NOT DETECTED NOT DETECTED   Staphylococcus lugdunensis NOT DETECTED NOT DETECTED   Streptococcus species NOT DETECTED NOT DETECTED   Streptococcus agalactiae NOT DETECTED NOT DETECTED   Streptococcus pneumoniae NOT DETECTED NOT DETECTED   Streptococcus pyogenes NOT DETECTED NOT DETECTED   A.calcoaceticus-baumannii NOT DETECTED NOT DETECTED   Bacteroides fragilis NOT DETECTED NOT DETECTED   Enterobacterales DETECTED (A) NOT DETECTED   Enterobacter cloacae complex NOT DETECTED NOT DETECTED   Escherichia coli DETECTED (A) NOT DETECTED   Klebsiella aerogenes NOT DETECTED NOT DETECTED   Klebsiella oxytoca NOT DETECTED NOT DETECTED   Klebsiella pneumoniae NOT DETECTED NOT DETECTED   Proteus species NOT DETECTED NOT DETECTED   Salmonella  species NOT DETECTED NOT DETECTED   Serratia marcescens NOT DETECTED NOT DETECTED   Haemophilus influenzae NOT DETECTED NOT DETECTED   Neisseria meningitidis NOT DETECTED NOT DETECTED   Pseudomonas aeruginosa NOT DETECTED NOT DETECTED   Stenotrophomonas maltophilia NOT DETECTED NOT DETECTED   Candida albicans NOT DETECTED NOT DETECTED   Candida auris NOT DETECTED NOT DETECTED   Candida glabrata NOT DETECTED NOT DETECTED   Candida krusei NOT DETECTED NOT DETECTED   Candida parapsilosis NOT DETECTED NOT DETECTED   Candida tropicalis NOT DETECTED NOT DETECTED   Cryptococcus neoformans/gattii NOT DETECTED NOT DETECTED   CTX-M ESBL NOT DETECTED NOT DETECTED   Carbapenem resistance IMP NOT DETECTED NOT DETECTED   Carbapenem resistance KPC NOT DETECTED NOT DETECTED   Carbapenem resistance NDM NOT DETECTED NOT DETECTED   Carbapenem resist OXA 48 LIKE NOT DETECTED NOT DETECTED   Carbapenem resistance VIM NOT DETECTED NOT DETECTED    Wanda Hasting PharmD, BCPS [facility] main pharmacy [phone] 04/05/2024 2:31 PM

## 2024-04-05 NOTE — Plan of Care (Signed)
  Problem: Education: Goal: Knowledge of General Education information will improve Description: Including pain rating scale, medication(s)/side effects and non-pharmacologic comfort measures Outcome: Not Progressing   Problem: Health Behavior/Discharge Planning: Goal: Ability to manage health-related needs will improve Outcome: Not Progressing   Problem: Clinical Measurements: Goal: Ability to maintain clinical measurements within normal limits will improve Outcome: Not Progressing   Problem: Clinical Measurements: Goal: Will remain free from infection Outcome: Not Progressing

## 2024-04-06 DIAGNOSIS — R6520 Severe sepsis without septic shock: Secondary | ICD-10-CM | POA: Diagnosis not present

## 2024-04-06 DIAGNOSIS — N1832 Chronic kidney disease, stage 3b: Secondary | ICD-10-CM

## 2024-04-06 DIAGNOSIS — I5022 Chronic systolic (congestive) heart failure: Secondary | ICD-10-CM

## 2024-04-06 DIAGNOSIS — N3001 Acute cystitis with hematuria: Secondary | ICD-10-CM | POA: Diagnosis not present

## 2024-04-06 LAB — COMPREHENSIVE METABOLIC PANEL WITH GFR
ALT: 17 U/L (ref 0–44)
AST: 26 U/L (ref 15–41)
Albumin: 3.3 g/dL — ABNORMAL LOW (ref 3.5–5.0)
Alkaline Phosphatase: 185 U/L — ABNORMAL HIGH (ref 38–126)
Anion gap: 10 (ref 5–15)
BUN: 29 mg/dL — ABNORMAL HIGH (ref 8–23)
CO2: 20 mmol/L — ABNORMAL LOW (ref 22–32)
Calcium: 8.3 mg/dL — ABNORMAL LOW (ref 8.9–10.3)
Chloride: 109 mmol/L (ref 98–111)
Creatinine, Ser: 1.86 mg/dL — ABNORMAL HIGH (ref 0.61–1.24)
GFR, Estimated: 34 mL/min — ABNORMAL LOW (ref >60)
Glucose, Bld: 104 mg/dL — ABNORMAL HIGH (ref 70–99)
Potassium: 3.9 mmol/L (ref 3.5–5.1)
Sodium: 139 mmol/L (ref 135–145)
Total Bilirubin: 0.9 mg/dL (ref 0.0–1.2)
Total Protein: 5.7 g/dL — ABNORMAL LOW (ref 6.5–8.1)

## 2024-04-06 LAB — CBC
HCT: 23.6 % — ABNORMAL LOW (ref 39.0–52.0)
Hemoglobin: 7 g/dL — ABNORMAL LOW (ref 13.0–17.0)
MCH: 27.8 pg (ref 26.0–34.0)
MCHC: 29.7 g/dL — ABNORMAL LOW (ref 30.0–36.0)
MCV: 93.7 fL (ref 80.0–100.0)
Platelets: 178 K/uL (ref 150–400)
RBC: 2.52 MIL/uL — ABNORMAL LOW (ref 4.22–5.81)
RDW: 19.4 % — ABNORMAL HIGH (ref 11.5–15.5)
WBC: 6.8 K/uL (ref 4.0–10.5)
nRBC: 0 % (ref 0.0–0.2)

## 2024-04-06 LAB — TYPE AND SCREEN
ABO/RH(D): B POS
Antibody Screen: NEGATIVE
Unit division: 0

## 2024-04-06 LAB — BPAM RBC
Blood Product Expiration Date: 202509232359
ISSUE DATE / TIME: 202508251423
Unit Type and Rh: 5100

## 2024-04-06 LAB — TROPONIN T, HIGH SENSITIVITY
Troponin T High Sensitivity: 240 ng/L (ref 0–19)
Troponin T High Sensitivity: 261 ng/L (ref 0–19)
Troponin T High Sensitivity: 282 ng/L (ref 0–19)

## 2024-04-06 MED ORDER — ENOXAPARIN SODIUM 30 MG/0.3ML IJ SOSY
30.0000 mg | PREFILLED_SYRINGE | INTRAMUSCULAR | Status: DC
  Administered 2024-04-06 – 2024-04-07 (×2): 30 mg via SUBCUTANEOUS
  Filled 2024-04-06 (×2): qty 0.3

## 2024-04-06 MED ORDER — FAMOTIDINE 20 MG PO TABS
20.0000 mg | ORAL_TABLET | Freq: Every day | ORAL | Status: DC
  Administered 2024-04-06 – 2024-04-10 (×5): 20 mg via ORAL
  Filled 2024-04-06 (×5): qty 1

## 2024-04-06 MED ORDER — CARVEDILOL 3.125 MG PO TABS
3.1250 mg | ORAL_TABLET | Freq: Two times a day (BID) | ORAL | Status: DC
Start: 2024-04-06 — End: 2024-04-11
  Administered 2024-04-06 – 2024-04-10 (×10): 3.125 mg via ORAL
  Filled 2024-04-06 (×10): qty 1

## 2024-04-06 NOTE — Evaluation (Signed)
 Physical Therapy Evaluation Patient Details Name: Connor Hansen MRN: [id number] DOB: 21-Jul-1934 Today's Date: 04/06/2024  History of Present Illness  Pt is 89 yo male admitted on 04/04/24 with severe sepsis.  Pt recently admitted 8/10-8/14 with Ecoli UTI, d/c to rehab, and then home on 8/24.  He developed confusion and fever and returned to hospital 8/24. Pt with hx including but not limited to  HTN, HLD, CAD, CKD 3B, GERD, gout, hypothyroidism, osteoarthritis, rheumatoid arthritis  Clinical Impression  Pt admitted with above diagnosis. At baseline, pt was independent.  He had recent hospitalization, went to SNF, was walking with RW and had returned home for 1 day prior to readmission.  Today, pt needing increased time, cues, assist to initiate and for sequencing.  Required min A but mod cues for all transfers and to ambulate 5' to a chair with RW.  Pt fatigued easily.  Pt resides alone and is well below baseline.  Pt currently with functional limitations due to the deficits listed below (see PT Problem List). Pt will benefit from acute skilled PT to increase their independence and safety with mobility to allow discharge.  Patient will benefit from continued inpatient follow up therapy, <3 hours/day at d/c.         If plan is discharge home, recommend the following: A little help with walking and/or transfers;A little help with bathing/dressing/bathroom;Assistance with cooking/housework;Assist for transportation;Help with stairs or ramp for entrance   Can travel by private vehicle   Yes    Equipment Recommendations None recommended by PT  Recommendations for Other Services       Functional Status Assessment Patient has had a recent decline in their functional status and demonstrates the ability to make significant improvements in function in a reasonable and predictable amount of time.     Precautions / Restrictions Precautions Precautions: Fall      Mobility  Bed Mobility Overal bed  mobility: Needs Assistance Bed Mobility: Supine to Sit     Supine to sit: Mod assist, HOB elevated, Used rails     General bed mobility comments: increased time and cues for sequencing    Transfers Overall transfer level: Needs assistance Equipment used: Rolling walker (2 wheels) Transfers: Sit to/from Stand, Bed to chair/wheelchair/BSC Sit to Stand: Min assist           General transfer comment: Increased time and cues; min A to rise with posterior bias    Ambulation/Gait Ambulation/Gait assistance: Min assist Gait Distance (Feet): 5 Feet Assistive device: Rolling walker (2 wheels) Gait Pattern/deviations: Step-to pattern, Shuffle, Decreased stride length Gait velocity: decreased     General Gait Details: Pt had difficulty initiating steps requiring assist to weight shift.  Min A for balance and RW managment  Stairs            Wheelchair Mobility     Tilt Bed    Modified Rankin (Stroke Patients Only)       Balance Overall balance assessment: Needs assistance Sitting-balance support: Feet supported Sitting balance-Leahy Scale: Good     Standing balance support: Reliant on assistive device for balance, Bilateral upper extremity supported Standing balance-Leahy Scale: Poor Standing balance comment: RW and min A                             Pertinent Vitals/Pain Pain Assessment Pain Assessment: No/denies pain    Home Living Family/patient expects to be discharged to:: Private residence Living Arrangements: Alone Available  Help at Discharge: Family;Available PRN/intermittently Type of Home: House Home Access: Stairs to enter   Entrance Stairs-Number of Steps: 2   Home Layout: Two level;Able to live on main level with bedroom/bathroom Home Equipment: Grab bars - tub/shower;Shower seat;Rolling Environmental consultant (2 wheels)      Prior Function Prior Level of Function : Driving;Independent/Modified Independent             Mobility Comments:  Prior to recent hospitalization and rehab stay could ambulate without AD.  At rehab was ambulating with RW, had d/c home with plan for HHPT but had to return to hospital. ADLs Comments: DIL makes most meals, able to complete light IADL's but was indep with ADL's including chronic foley management. LImited driving due to vision.     Extremity/Trunk Assessment   Upper Extremity Assessment Upper Extremity Assessment: Generalized weakness    Lower Extremity Assessment Lower Extremity Assessment: LLE deficits/detail;RLE deficits/detail RLE Deficits / Details: noted genu varus bil; ROM WFL; MMT 4/5 LLE Deficits / Details: noted genu varus bil; ROM WFL; MMT 4/5    Cervical / Trunk Assessment Cervical / Trunk Assessment: Normal  Communication        Cognition Arousal: Alert Behavior During Therapy: Flat affect   PT - Cognitive impairments: No family/caregiver present to determine baseline, Initiation, Sequencing, Problem solving                       PT - Cognition Comments: Pt slow to respond, vague with details, and flat affect.  He needs multimodal cues to initiate and cues for sequencing/motor planning.         Cueing       General Comments General comments (skin integrity, edema, etc.): VSS; required encouragement and mod cues for all    Exercises     Assessment/Plan    PT Assessment Patient needs continued PT services  PT Problem List Decreased strength;Decreased mobility;Decreased safety awareness;Decreased knowledge of precautions;Decreased activity tolerance;Decreased cognition;Decreased balance;Decreased knowledge of use of DME;Decreased coordination       PT Treatment Interventions DME instruction;Therapeutic activities;Cognitive remediation;Gait training;Therapeutic exercise;Patient/family education;Functional mobility training;Balance training;Stair training;Neuromuscular re-education    PT Goals (Current goals can be found in the Care Plan section)   Acute Rehab PT Goals Patient Stated Goal: not stated PT Goal Formulation: With patient Time For Goal Achievement: 04/20/24 Potential to Achieve Goals: Good    Frequency Min 2X/week     Co-evaluation               AM-PAC PT 6 Clicks Mobility  Outcome Measure Help needed turning from your back to your side while in a flat bed without using bedrails?: A Lot (for all transfers min A but mod cues) Help needed moving from lying on your back to sitting on the side of a flat bed without using bedrails?: A Lot Help needed moving to and from a bed to a chair (including a wheelchair)?: A Lot Help needed standing up from a chair using your arms (e.g., wheelchair or bedside chair)?: A Lot Help needed to walk in hospital room?: Total Help needed climbing 3-5 steps with a railing? : Total 6 Click Score: 10    End of Session Equipment Utilized During Treatment: Gait belt Activity Tolerance: Patient tolerated treatment well Patient left: in chair;with call bell/phone within reach;with chair alarm set Nurse Communication: Mobility status PT Visit Diagnosis: Unsteadiness on feet (R26.81);Difficulty in walking, not elsewhere classified (R26.2)    Time: 8698-8677 PT Time Calculation (min) (  ACUTE ONLY): 21 min   Charges:   PT Evaluation $PT Eval Moderate Complexity: 1 Mod   PT General Charges $$ ACUTE PT VISIT: 1 Visit         Benjiman, PT Acute Rehab [facility] Rehab [phone]   Benjiman VEAR Mulberry 04/06/2024, 2:22 PM

## 2024-04-06 NOTE — Assessment & Plan Note (Signed)
 -  Continue Lipitor

## 2024-04-06 NOTE — Assessment & Plan Note (Signed)
 Continue Synthroid 

## 2024-04-06 NOTE — Assessment & Plan Note (Signed)
-   Recent diagnosis and follows with urology - Recent CT A/P on admission, 04/05/2024 shows diffuse heterogenous sclerotic lesions of the axial and appendicular skeleton consistent with osseous metastases

## 2024-04-06 NOTE — TOC Initial Note (Signed)
 Transition of Care [facility]) - Initial/Assessment Note    Patient Details  Name: Connor Hansen MRN: [id number] Date of Birth: Mar 10, 1934  Transition of Care [facility]) CM/SW Contact:    Jon ONEIDA Anon, RN Phone Number: 04/06/2024, 11:03 AM  Clinical Narrative:                 Pt recently discharged from hospital to SNF [facility] in [place] for STR. Discharged from SNF, returned home, developed a fever with confusion and was brought into the [facility]. Before leaving SNF, HHPT/OT was set through with [facility]. Cindie with [facility] confirms. Pt will need ROC orders for HHPT/OT at DC. IP Care Management will continue to follow for any new recommendations or DC needs.    Expected Discharge Plan: Home w Home Health Services Barriers to Discharge: Continued Medical Work up   Patient Goals and CMS Choice Patient states their goals for this hospitalization and ongoing recovery are:: Return home CMS Medicare.gov Compare Post Acute Care list provided to:: Other (Comment Required) (NA) Choice offered to / list presented to : NA [place] ownership interest in [facility].provided to:: Parent NA    Expected Discharge Plan and Services In-house Referral: NA Discharge Planning Services: CM Consult Post Acute Care Choice: Home Health Living arrangements for the past 2 months: Single Family Home, Skilled Nursing Facility                 DME Arranged: N/A DME Agency: NA       HH Arranged: OT, PT HH Agency: [facility] Date [facility] Agency Contacted: 04/05/24 Time HH Agency Contacted: 1415 Representative spoke with at [facility] Agency: Cindie Sillmon  Prior Living Arrangements/Services Living arrangements for the past 2 months: Single Family Home, Skilled Nursing Facility Lives with:: Self, Adult Children Patient language and need for interpreter reviewed:: Yes Do you feel safe going back to the place where you live?: Yes      Need for Family Participation in Patient Care: Yes  (Comment) Care giver support system in place?: Yes (comment) Current home services: DME, Home PT, Home OT Criminal Activity/Legal Involvement Pertinent to Current Situation/Hospitalization: No - Comment as needed  Activities of Daily Living      Permission Sought/Granted Permission sought to share information with : Family Supports, Magazine features editor Permission granted to share information with : Yes, Verbal Permission Granted  Share Information with NAME: Johnmark, Geiger (Son)  [phone]  Permission granted to share info w AGENCY: [facility]        Emotional Assessment Appearance:: Appears stated age Attitude/Demeanor/Rapport: Unable to Assess Affect (typically observed): Unable to Assess Orientation: : Oriented to Self, Oriented to Place, Fluctuating Orientation (Suspected and/or reported Sundowners) Alcohol / Substance Use: Not Applicable Psych Involvement: No (comment)  Admission diagnosis:  Acute cystitis without hematuria [N30.00] Severe sepsis (HCC) [A41.9, R65.20] Altered mental status, unspecified altered mental status type [R41.82] Sepsis, due to unspecified organism, unspecified whether acute organ dysfunction present [facility]) [A41.9] Patient Active Problem List   Diagnosis Date Noted   Acute cystitis with hematuria 04/05/2024   Acute metabolic encephalopathy 04/05/2024   Altered mental status 04/05/2024   Prostate cancer metastatic to bone (HCC) 04/05/2024   Severe sepsis (HCC) 04/04/2024   Palliative care encounter 03/24/2024   Prostate cancer (HCC) 03/24/2024   Malignant neoplasm metastatic to bone (HCC) 03/24/2024   Counseling and coordination of care 03/24/2024   Cancer associated pain 03/24/2024   Normocytic anemia 03/22/2024   Cervical  disc disease 03/21/2024   Dysthymia 03/21/2024   Gastro-esophageal reflux disease without esophagitis 03/21/2024   Primary gout 03/21/2024   Hypothyroidism 03/21/2024   Long term (current) use of  immunomodulator 03/21/2024   Prediabetes 03/21/2024   Pure hypercholesterolemia 03/21/2024   Rheumatoid arthritis (HCC) 03/21/2024   Osteoarthritis 03/21/2024   Stage 3a chronic kidney disease (HCC) 03/21/2024   UTI (urinary tract infection) due to urinary indwelling catheter (HCC) 03/21/2024   AKI (acute kidney injury) (HCC) 03/21/2024   Fever 03/21/2024   Acute encephalopathy 03/21/2024   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 [place] [address] Phone: [phone] Fax: [phone]     Social Drivers of Health (SDOH) Social History: SDOH Screenings   Food Insecurity: No Food Insecurity (03/21/2024)  Housing: Low Risk  (03/21/2024)  Transportation Needs: No Transportation Needs (03/21/2024)  Utilities: Not At Risk (03/21/2024)  Social Connections: Patient Unable To Answer (04/05/2024)  Tobacco Use: Medium Risk (04/04/2024)   SDOH Interventions:     Readmission Risk Interventions    04/06/2024   10:58 AM  Readmission Risk Prevention Plan  Transportation Screening Complete  Medication Review (RN Care Manager) Complete  PCP or Specialist appointment within 3-5 days of discharge Complete  HRI or Home Care Consult Complete  SW Recovery Care/Counseling Consult Complete  Palliative Care Screening Not Applicable  Skilled Nursing Facility Complete

## 2024-04-06 NOTE — Assessment & Plan Note (Signed)
-   Suspected in setting of underlying UTI and bacteremia - Now improved

## 2024-04-06 NOTE — Assessment & Plan Note (Signed)
-   Likely has developed component of CKD due to outlet obstruction from underlying prostate cancer -Now has Foley in place - Creatinine in July noted 1.89 with GFR low 30s -Creatinine has maintained stability with this baseline; did have some prior worsening during last hospitalization but has now improved back to his prior baseline values -Creatinine 1.86 this morning with correlating GFR 34

## 2024-04-06 NOTE — Assessment & Plan Note (Signed)
-   Some worsening from prior baseline, down to 7 g/dL this morning -No overt signs of bleeding.  Etiology suspected in setting of underlying acute illness and malignancy - Repeat hemoglobin in a.m., if lower will transfuse

## 2024-04-06 NOTE — Assessment & Plan Note (Signed)
-   Resume Coreg  - Continue doxazosin

## 2024-04-06 NOTE — Assessment & Plan Note (Signed)
-   Med list being clarified

## 2024-04-06 NOTE — Assessment & Plan Note (Signed)
-   Febrile, tachycardia, tachypnea, lactic acidosis.  Urinary source -Continues to have chronic Foley in place - Continue Rocephin  - Finalized blood and urine cultures reviewed

## 2024-04-06 NOTE — Consult Note (Signed)
 Urology Consult Note   Requesting Attending Physician:  Will Norris MD Service Providing Consult: Urology  Consulting Attending: Dr. Alvaro   Reason for Consult: Urinary retention with bilateral hydronephrosis  HPI: Connor Hansen is seen in consultation for reasons noted above at the request of Dr. Will.  Patient is an 89 year old male known to our practice whom is followed by Dr. Alvaro in our advanced prostate cancer clinic.  He remains on androgen deprivation therapy and treatment of his metastatic disease.  PMH also significant for rheumatoid arthritis on Enbrel and weekly methotrexate, metastatic prostate cancer with innumerable bone metastasis, catheter dependent urinary retention, CKD 3B, CAD, hypothyroidism, HTN, and HLD.  He presented to [facility] emergency department on 88/20/25 with complaint of fever-Tmax 104.4, chills, weakness, N/V, and confusion.  He was admitted for severe sepsis.   ------------------  Assessment:   89 y.o. male with advanced metastatic prostate cancer with chronic urinary retention/Foley catheter presenting with severe sepsis.   Recommendations: # Metastatic prostate cancer # Chronic urinary retention #AoCKD  Chronic Foley catheter dependent.  Good UOP.  Serum creatinine appears at baseline.  Patient was seen in clinic on 8/8 with more mildly similar symptoms and was treated empirically with Bactrim.  Culture data shows resistance  Unfortunately his combination of immunosuppression and catheter necessity are going to open him up to more frequent and severe infection.  May benefit from daily suppressive treatment.  Previous culture data shows sensitivity to Macrobid.  Will review with his primary urologist  He remains on androgen deprivation therapy  Agree with broad ABX.  Sensitivities pending for E. coli bacteremia.  Office culture data from 8/8 shows sensitivity to his current ceftriaxone .    No acute surgical indication at this  time.  Please call with questions.   Case and plan discussed with Dr. Alvaro  Past Medical History: Past Medical History:  Diagnosis Date   CAD (coronary artery disease)    2/10 - BMS to mid LAD, RCA. Anteroapical akinesis - normal EF. 2010   CKD (chronic kidney disease)    Coronary atherosclerosis of native coronary artery    GERD (gastroesophageal reflux disease)    HTN (hypertension)    Hyperlipidemia    Hypothyroidism    Old myocardial infarct    RA (rheumatoid arthritis) (HCC)     Past Surgical History:  Past Surgical History:  Procedure Laterality Date   CHOLECYSTECTOMY     HEMILAMINOTOMY LUMBAR SPINE      Medication: Current Facility-Administered Medications  Medication Dose Route Frequency Provider Last Rate Last Admin   acetaminophen  (TYLENOL ) tablet 650 mg  650 mg Oral Q6H PRN Amponsah, Prosper M, MD   650 mg at 04/05/24 2216   Or   acetaminophen  (TYLENOL ) suppository 650 mg  650 mg Rectal Q6H PRN Lou Claretta HERO, MD       allopurinol  (ZYLOPRIM ) tablet 100 mg  100 mg Oral QPM Lou Claretta HERO, MD   100 mg at 04/05/24 1713   aspirin  chewable tablet 81 mg  81 mg Oral QPM Lou Claretta HERO, MD   81 mg at 04/05/24 1713   atorvastatin  (LIPITOR) tablet 80 mg  80 mg Oral QPM Amponsah, Prosper M, MD   80 mg at 04/05/24 1713   bisacodyl  (DULCOLAX) EC tablet 5 mg  5 mg Oral Daily PRN Amponsah, Prosper M, MD       cefTRIAXone  (ROCEPHIN ) 2 g in sodium chloride  0.9 % 100 mL IVPB  2 g Intravenous Q24H Bevely Perkins  S, RPH       Chlorhexidine  Gluconate Cloth 2 % PADS 6 each  6 each Topical Daily Amponsah, Prosper M, MD   6 each at 04/05/24 1514   doxazosin  (CARDURA ) tablet 2 mg  2 mg Oral QHS Amponsah, Prosper M, MD   2 mg at 04/05/24 2216   enoxaparin  (LOVENOX ) injection 30 mg  30 mg Subcutaneous Q24H Will Almarie MATSU, MD       feeding supplement (ENSURE PLUS HIGH PROTEIN) liquid 237 mL  237 mL Oral BID BM Amponsah, Prosper M, MD   237 mL at 04/05/24 0820    hydrALAZINE  (APRESOLINE ) injection 10 mg  10 mg Intravenous Q8H PRN Lou Claretta HERO, MD       HYDROmorphone  (DILAUDID ) injection 0.5 mg  0.5 mg Intravenous Q6H PRN Amponsah, Prosper M, MD       levothyroxine  (SYNTHROID ) tablet 112 mcg  112 mcg Oral Q0600 Lou Claretta HERO, MD   112 mcg at 04/06/24 9381   multivitamin (PROSIGHT) tablet 1 tablet  1 tablet Oral BID Lou Claretta HERO, MD   1 tablet at 04/05/24 2216   ondansetron  (ZOFRAN ) tablet 4 mg  4 mg Oral Q6H PRN Amponsah, Prosper M, MD       Or   ondansetron  (ZOFRAN ) injection 4 mg  4 mg Intravenous Q6H PRN Amponsah, Prosper M, MD   4 mg at 04/05/24 2337   Oral care mouth rinse  15 mL Mouth Rinse PRN Lou Claretta HERO, MD       oxyCODONE  (Oxy IR/ROXICODONE ) immediate release tablet 5 mg  5 mg Oral Q6H PRN Amponsah, Prosper M, MD   5 mg at 04/05/24 1712   oxyCODONE  (OXYCONTIN ) 12 hr tablet 10 mg  10 mg Oral Q12H Amponsah, Prosper M, MD   10 mg at 04/05/24 2216   senna-docusate (Senokot-S) tablet 1 tablet  1 tablet Oral QHS PRN Lou Claretta HERO, MD       senna-docusate (Senokot-S) tablet 1 tablet  1 tablet Oral QHS Lou Claretta HERO, MD   1 tablet at 04/05/24 2217    Allergies: No Known Allergies  Social History: Social History   Tobacco Use   Smoking status: Former    Types: Cigarettes   Smokeless tobacco: Never  Vaping Use   Vaping status: Never Used  Substance Use Topics   Alcohol use: Not Currently   Drug use: No    Family History Family History  Problem Relation Age of Onset   Cancer Father     Review of Systems  Genitourinary:  Negative for dysuria, flank pain, frequency, hematuria and urgency.     Objective   Vital signs in last 24 hours: BP (!) 122/44   Pulse 60   Temp 99 F (37.2 C) (Oral)   Resp 11   Ht 5' 9 (1.753 m)   Wt 80 kg   SpO2 100%   BMI 26.05 kg/m   Physical Exam General: A&O, resting, appropriate HEENT: [place]/AT Pulmonary: Normal work of breathing Cardiovascular: no  cyanosis Abdomen: Soft, NTTP, nondistended GU: Foley catheter in place draining clear yellow urine Neuro: Appropriate, no focal neurological deficits  Most Recent Labs: Lab Results  Component Value Date   WBC 6.8 04/06/2024   HGB 7.0 (L) 04/06/2024   HCT 23.6 (L) 04/06/2024   PLT 178 04/06/2024    Lab Results  Component Value Date   NA 139 04/06/2024   K 3.9 04/06/2024   CL 109 04/06/2024   CO2 20 (L) 04/06/2024  BUN 29 (H) 04/06/2024   CREATININE 1.86 (H) 04/06/2024   CALCIUM  8.3 (L) 04/06/2024    Lab Results  Component Value Date   INR 1.1 04/04/2024     Urine Culture: @[id number] (laburin,org,[id number])@   IMAGING: CT ABDOMEN PELVIS WO CONTRAST Result Date: 04/05/2024 CLINICAL DATA:  Abdominal/flank pain, stone suspected Eval for suspected renal stone. Metastatic prostate cancer. EXAM: CT ABDOMEN AND PELVIS WITHOUT CONTRAST TECHNIQUE: Multidetector CT imaging of the abdomen and pelvis was performed following the standard protocol without IV contrast. RADIATION DOSE REDUCTION: This exam was performed according to the departmental dose-optimization program which includes automated exposure control, adjustment of the mA and/or kV according to patient size and/or use of iterative reconstruction technique. COMPARISON:  PET CT 03/17/2024, CT abdomen pelvis 03/21/2024 FINDINGS: Lower chest: Slightly increased bilateral trace pleural effusions, left greater than right. Tiny hiatal hernia. Hepatobiliary: No focal liver abnormality. Status post cholecystectomy. No biliary dilatation. Pancreas: Diffusely atrophic. No focal lesion. Otherwise normal pancreatic contour. No surrounding inflammatory changes. No main pancreatic ductal dilatation. Spleen: Normal in size without focal abnormality. Adrenals/Urinary Tract: No adrenal nodule bilaterally. Bilateral moderate hydroureteronephrosis with urothelial thickening. No ureterolithiasis bilaterally. Punctate right and 4 mm left  nephrolithiasis. Fluid dense lesion of the left kidney likely represents a simple renal cyst. Simple renal cysts, in the absence of clinically indicated signs/symptoms, require no independent follow-up. The urinary bladder is decompressed with a Foley catheter within its lumen. Circumferential urinary bladder wall thickening likely due to decompression. Stomach/Bowel: Stomach is within normal limits. No evidence of bowel wall thickening or dilatation. Appendix appears normal. Vascular/Lymphatic: No abdominal aorta or iliac aneurysm. Severe atherosclerotic plaque of the aorta and its branches. No abdominal, pelvic, or inguinal lymphadenopathy. Reproductive: Prostate is enlarged measuring up to 5.6 cm. Other: No intraperitoneal free fluid. No intraperitoneal free gas. No organized fluid collection. Musculoskeletal: Small bilateral fat containing inguinal hernias, left greater than right. No suspicious lytic or blastic osseous lesions. No acute displaced fracture. Diffuse heterogeneous sclerotic lesions of the axial and appendicular skeleton. IMPRESSION: 1. Bilateral moderate hydronephrosis. This may reflect chronic changes of obstructive uropathy or reflux. Bilateral urothelial thickening and question of mild circumferential urinary bladder wall thickening in the setting of decompression- correlate with urinalysis for infection. 2. Nonobstructive bilateral nephrolithiasis measuring up 4 mm on the left and punctate on the right. 3. Prostatomegaly. 4. Small bilateral fat containing inguinal hernias, left greater than right. 5. Diffuse heterogeneous sclerotic lesions of the axial and appendicular skeleton consistent with osseous metastases. 6. Stable tiny hiatal hernia. 7. Slightly increased bilateral trace pleural effusions, left greater than right. 8.  Aortic Atherosclerosis (ICD10-I70.0). Electronically Signed   By: Morgane  Naveau M.D.   On: 04/05/2024 17:11   DG Chest Port 1 View Result Date: 04/04/2024 CLINICAL  DATA:  Possible sepsis and weakness EXAM: PORTABLE CHEST 1 VIEW COMPARISON:  03/21/2024 FINDINGS: Cardiac shadow is within normal limits. Lungs are well aerated bilaterally. No focal infiltrate or effusion is seen. No bony abnormality is noted. IMPRESSION: No acute abnormality seen. Electronically Signed   By: Oneil Devonshire M.D.   On: 04/04/2024 22:25    ------  Ole Bourdon, NP Pager: [phone]   Please contact the urology consult pager with any further questions/concerns.

## 2024-04-06 NOTE — Assessment & Plan Note (Signed)
-   no s/s exac at this time - last echo 03/23/24: EF 40 to 45%, presence of RWMA noted; normal diastolic parameters - Troponin 22 initially with some uptrend noted. Denies chest pain; EKG negative for acute changes - suspected demand, no further trop trending

## 2024-04-06 NOTE — Assessment & Plan Note (Signed)
-   Last Foley exchange reported to be 03/25/2024 -Continue Rocephin ; see bacteremia as well

## 2024-04-06 NOTE — Progress Notes (Addendum)
 Progress Note    Connor Hansen   FMW:[id number]  DOB: 04-Aug-1934  DOA: 04/04/2024     2 PCP: Ransom Other, MD  Initial CC: Fevers, weakness, confusion  Hospital Course: Connor Hansen is an 89 y.o. male with PMH HTN, HLD, CAD, CKD 3B, GERD, gout, hypothyroidism, osteoarthritis, rheumatoid arthritis on Enbrel and weekly methotrexate, recently diagnosed prostate cancer, bilateral hydronephrosis s/p indwelling Foley catheter and a recent admission for sepsis and E. coli bacteremia who returns to the ED for evaluation of weakness, confusion and fever.  Per son, patient did very well at rehab and was progressing both physically and neurologically, spent 9 days there and was discharged 8/24.   On the day of admission he suddenly started having fevers as high as 102, chills, weakness and confusion.  Reports patient also had an episode of nausea and vomiting at home and continues to endorse mild abdominal discomfort but no headache, dizziness, shortness of breath, chest pain or change in appetite.  Patient has also had intermittent tremors of the right hand.  He was due to follow-up with Dr. Alvaro on 04/08/2024. He was admitted to the hospital 03/21/2024 through 03/25/2024 with E. coli UTI and bacteremia and was discharged on Keflex .  CT abdomen at that time showed bilateral hydronephrosis and diffuse bone mets.  He was admitted for repeat infectious workup.  Urine culture from 04/04/2024 growing gram-negative rods and blood cultures from admission again growing E. coli presumed translocation from urinary source once again. Urology was also consulted on admission.  Interval History:  Appearing much more comfortable this morning and confusion has improved but still lingering some.  Assessment and Plan: * Severe sepsis (HCC) - Febrile, tachycardia, tachypnea, lactic acidosis.  Urinary source -Continues to have chronic Foley in place - Continue Rocephin  - Continue following blood and urine cultures for  finalization  Acute cystitis with hematuria - Last Foley exchange reported to be 03/25/2024 -Continue Rocephin  - Follow-up further urology recommendations  Chronic kidney disease, stage 3b (HCC) - Likely has developed component of CKD due to outlet obstruction from underlying prostate cancer -Now has Foley in place - Creatinine in July noted 1.89 with GFR low 30s -Creatinine has maintained stability with this baseline; did have some prior worsening during last hospitalization but has now improved back to his prior baseline values -Creatinine 1.86 this morning with correlating GFR 34  Acute metabolic encephalopathy-resolved as of 04/06/2024 - Suspected in setting of underlying UTI and bacteremia - Now improved  Chronic systolic CHF (congestive heart failure) (HCC) - no s/s exac at this time - last echo 03/23/24: EF 40 to 45%, presence of RWMA noted; normal diastolic parameters - Troponin 22 initially with some uptrend noted, 112.  Denies chest pain; EKG negative for acute changes -Repeat troponin this morning  Normocytic anemia - Some worsening from prior baseline, down to 7 g/dL this morning -No overt signs of bleeding.  Etiology suspected in setting of underlying acute illness and malignancy - Repeat hemoglobin in a.m., if lower will transfuse  Prostate cancer metastatic to bone [facility]) - Recent diagnosis and follows with urology - Recent CT A/P on admission, 04/05/2024 shows diffuse heterogenous sclerotic lesions of the axial and appendicular skeleton consistent with osseous metastases  Rheumatoid arthritis (HCC) - Med list being clarified  Hypothyroidism - Continue Synthroid   HTN (hypertension) - Resume Coreg  - Continue doxazosin   Hyperlipidemia - Continue Lipitor   Old records reviewed in assessment of this patient  Antimicrobials: Vancomycin  04/05/2024 x 1 Rocephin  04/06/2024 >>  current  DVT prophylaxis:  enoxaparin  (LOVENOX ) injection 30 mg Start: 04/06/24  1000   Code Status:   Code Status: Limited: Do not attempt resuscitation (DNR) -DNR-LIMITED -Do Not Intubate/DNI   Mobility Assessment (Last 72 Hours)     Mobility Assessment     Row Name 04/06/24 0900 04/05/24 2000 04/05/24 0800 04/05/24 0100     Does the patient have exclusion criteria? No - Perform mobility assessment No - Perform mobility assessment No - Perform mobility assessment No - Perform mobility assessment    What is the highest level of mobility based on the mobility assessment? Level 2 (Chairfast) - Balance while sitting on edge of bed and cannot stand Level 2 (Chairfast) - Balance while sitting on edge of bed and cannot stand Level 2 (Chairfast) - Balance while sitting on edge of bed and cannot stand Level 2 (Chairfast) - Balance while sitting on edge of bed and cannot stand    Is the above level different from baseline mobility prior to current illness? Yes - Recommend PT order Yes - Recommend PT order Yes - Recommend PT order Yes - Recommend PT order       Barriers to discharge: none Disposition Plan:  TBD HH orders placed:  Status is: Inpt  Objective: Blood pressure (!) 156/60, pulse 84, temperature 99 F (37.2 C), temperature source Oral, resp. rate 20, height 5' 9 (1.753 m), weight 80 kg, SpO2 97%.  Examination:  Physical Exam Constitutional:      General: He is not in acute distress.    Appearance: Normal appearance.  HENT:     Head: Normocephalic and atraumatic.     Mouth/Throat:     Mouth: Mucous membranes are moist.  Eyes:     Extraocular Movements: Extraocular movements intact.  Cardiovascular:     Rate and Rhythm: Normal rate and regular rhythm.  Pulmonary:     Effort: Pulmonary effort is normal. No respiratory distress.     Breath sounds: Normal breath sounds. No wheezing.  Abdominal:     General: Bowel sounds are normal. There is no distension.     Palpations: Abdomen is soft.     Tenderness: There is no abdominal tenderness.  Genitourinary:     Comments: Foley in place with clear yellow urine in bag Musculoskeletal:        General: Normal range of motion.     Cervical back: Normal range of motion and neck supple.  Skin:    General: Skin is warm and dry.  Neurological:     General: No focal deficit present.     Mental Status: He is alert.  Psychiatric:        Mood and Affect: Mood normal.        Behavior: Behavior normal.      Consultants:  Urology  Procedures:    Data Reviewed: Results for orders placed or performed during the hospital encounter of 04/04/24 (from the past 24 hours)  ABO/Rh     Status: None   Collection Time: 04/05/24 12:41 PM  Result Value Ref Range   ABO/RH(D)      B POS Performed at [facility], [address], [place], [place] [address]   Hemoglobin and hematocrit, blood     Status: Abnormal   Collection Time: 04/05/24  8:47 PM  Result Value Ref Range   Hemoglobin 7.9 (L) 13.0 - 17.0 g/dL   HCT 76.5 (L) 60.9 - 47.9 %  CBC     Status: Abnormal  Collection Time: 04/06/24  3:20 AM  Result Value Ref Range   WBC 6.8 4.0 - 10.5 K/uL   RBC 2.52 (L) 4.22 - 5.81 MIL/uL   Hemoglobin 7.0 (L) 13.0 - 17.0 g/dL   HCT 76.3 (L) 60.9 - 47.9 %   MCV 93.7 80.0 - 100.0 fL   MCH 27.8 26.0 - 34.0 pg   MCHC 29.7 (L) 30.0 - 36.0 g/dL   RDW 80.5 (H) 88.4 - 84.4 %   Platelets 178 150 - 400 K/uL   nRBC 0.0 0.0 - 0.2 %  Comprehensive metabolic panel     Status: Abnormal   Collection Time: 04/06/24  3:20 AM  Result Value Ref Range   Sodium 139 135 - 145 mmol/L   Potassium 3.9 3.5 - 5.1 mmol/L   Chloride 109 98 - 111 mmol/L   CO2 20 (L) 22 - 32 mmol/L   Glucose, Bld 104 (H) 70 - 99 mg/dL   BUN 29 (H) 8 - 23 mg/dL   Creatinine, Ser 8.13 (H) 0.61 - 1.24 mg/dL   Calcium  8.3 (L) 8.9 - 10.3 mg/dL   Total Protein 5.7 (L) 6.5 - 8.1 g/dL   Albumin  3.3 (L) 3.5 - 5.0 g/dL   AST 26 15 - 41 U/L   ALT 17 0 - 44 U/L   Alkaline Phosphatase 185 (H) 38 - 126 U/L   Total Bilirubin 0.9 0.0 - 1.2  mg/dL   GFR, Estimated 34 (L) >60 mL/min   Anion gap 10 5 - 15    I have reviewed pertinent nursing notes, vitals, labs, and images as necessary. I have ordered labwork to follow up on as indicated.  I have reviewed the last notes from staff over past 24 hours. I have discussed patient's care plan and test results with nursing staff, CM/SW, and other staff as appropriate.  Time spent: Greater than 50% of the 55 minute visit was spent in counseling/coordination of care for the patient as laid out in the A&P.   LOS: 2 days   Alm Apo, MD [facility] 04/06/2024, 12:26 PM

## 2024-04-06 NOTE — Hospital Course (Addendum)
 89 yom w/ HTN, HLD, CAD, CKD 3B, GERD, gout, hypothyroidism, osteoarthritis, rheumatoid arthritis on Enbrel and weekly methotrexate, recently diagnosed prostate cancer, bilateral hydronephrosis s/p indwelling Foley catheter and a recent admission for sepsis and E. coli bacteremia-03/21/2024 through 03/25/2024 with E. coli UTI and bacteremia and was discharged on Keflex ,CT abdomen at that time showed bilateral hydronephrosis and diffuse bone mets, returned to the ED for evaluation of weakness, confusion and fever.  Per son, patient did very well at rehab and was progressing both physically and neurologically, spent 9 days there and was discharged 8/24. On the day of admission he suddenly started having fevers as high as 102, chills, weakness and confusion.  Reports patient also had an episode of nausea and vomiting at home and continues to endorse mild abdominal discomfort but no headache, dizziness, shortness of breath, chest pain or change in appetite.  Patient has also had intermittent tremors of the right hand.  He was due to follow-up with Dr. Alvaro on 04/08/2024. He was admitted to the hospital  He was admitted for repeat infectious workup.  Urine culture from 04/04/2024 growing gram-negative rods and blood cultures from admission again growing E. coli presumed translocation from urinary source once again. Urology was also consulted on admission. Seen by palliative care  Subjective: Seen and examined today Overnight afebrile BP stable He is on the bedside chair appears pleasant alert awake interactive [facility] medicine also arrived Labs reviewed from 8/28 creatinine slightly improving hemoglobin fair at 8.7  Assessment and plan:  Severe sepsis POA 2.2 E coli bacteremia Acute cystitis with hematuria: Blood cultures positive from 04/04/2024; recent infection was similar from 03/21/2024-suspecting urinary source  Sepsis physiology has resolved..  Final urine and blood culture reviewed continue ceftriaxone  X  10 days during hospitalization;  EOT 04/14/2024, then okay for discharge home with hospice   Chronic indwelling Foley catheter After completion of antibiotics, continue on suppressive treatment with nitrofurantoin per urology recommendations Foley catheter exchanged on 8/28    Goals of care, counseling/discussion Multiple discussions held bedside with patient and family during hospitalization.  Given underlying metastatic prostate cancer and guarded prognosis, palliative care has been consulted for GOC discussions along with hospice per family request After GOC discussion with palliative care on 8/28, plan is for continuing course of Rocephin  inpatient until complete then bringing patient home with hospice   CKD 3b  Likely has developed component of CKD due to outlet obstruction from underlying prostate cancer Continue Foley, renal fun ok   Acute metabolic encephalopathy Suspected in setting of underlying UTI and bacteremia, continue delirium and fall precaution.   Chronic systolic CHF Stable. last echo 03/23/24: EF 40 to 45%, presence of RWMA noted; normal diastolic parameters Troponin 22 initially with some uptrend noted. Denies chest pain; EKG negative for acute changes suspected demand, no further trop trending    Normocytic anemia Stable/   Prostate cancer metastatic to bone  Recent diagnosis and follows with urology Recent CT A/P on admission, 04/05/2024 shows diffuse heterogenous sclerotic lesions of the axial and appendicular skeleton consistent with osseous metastases.  Oxycodone  for pain management   Rheumatoid arthritis Med list being clarified   Hypothyroidism Continue Synthroid    HTN:  BP well-controlled on Coreg  low-dose, Cardura     Hyperlipidemia Continue Lipitor  Mobility: PT Orders: Active PT Follow up Rec: Skilled Nursing-Short Term Rehab (<3 Hours/Day) (Current Plan Is [facility] Palliative Vs Home With Hospice)04/08/2024 1552   DVT prophylaxis: enoxaparin   (LOVENOX ) injection 40 mg Start: 04/08/24 1000 Code Status:  Code Status: Limited: Do not attempt resuscitation (DNR) -DNR-LIMITED -Do Not Intubate/DNI  Family Communication: plan of care discussed with patient/ at bedside. Patient status is: Remains hospitalized because of severity of illness Level of care: Med-Surg   Dispo: The patient is from: home            Anticipated disposition:  home with hospice after completion of antibiotics Objective: Vitals last 24 hrs: Vitals:   04/09/24 1300 04/09/24 2031 04/09/24 2032 04/10/24 0636  BP: 127/82 124/66 124/66 138/73  Pulse: 97 95 95 92  Resp: 20 19  20   Temp:  99 F (37.2 C)  99.4 F (37.4 C)  TempSrc: Axillary Oral  Oral  SpO2: 100% 94%  (!) 82%  Weight:      Height:        Physical Examination: General exam: alert awake, pleasant. HEENT:Oral mucosa moist, Ear/Nose WNL grossly Respiratory system: Bilaterally clear BS,no use of accessory muscle Cardiovascular system: S1 & S2 +, No JVD. Gastrointestinal system: Abdomen soft,NT,ND, BS+ Nervous System: Alert, awake, moving all extremities,and following commands. Extremities: LE edema neg, distal extremities warm.  Skin: No rashes,no icterus. MSK: Normal muscle bulk,tone, power   Medications reviewed:  Scheduled Meds:  allopurinol   100 mg Oral QPM   aspirin   81 mg Oral QPM   atorvastatin   80 mg Oral QPM   carvedilol   3.125 mg Oral BID   Chlorhexidine  Gluconate Cloth  6 each Topical Daily   doxazosin   2 mg Oral QHS   enoxaparin  (LOVENOX ) injection  40 mg Subcutaneous Q24H   famotidine   20 mg Oral QHS   feeding supplement  237 mL Oral BID BM   levothyroxine   112 mcg Oral Q0600   magic mouthwash  5 mL Oral TID   multivitamin  1 tablet Oral BID   oxyCODONE   10 mg Oral Q12H   polyethylene glycol  17 g Oral Daily   senna-docusate  2 tablet Oral BID   Continuous Infusions:  cefTRIAXone  (ROCEPHIN )  IV 2 g (04/10/24 1106)   Diet: Diet Order             Diet regular Room  service appropriate? Yes; Fluid consistency: Thin  Diet effective now

## 2024-04-06 NOTE — Plan of Care (Signed)
  Problem: Clinical Measurements: Goal: Respiratory complications will improve Outcome: Progressing Goal: Cardiovascular complication will be avoided Outcome: Progressing   Problem: Coping: Goal: Level of anxiety will decrease Outcome: Progressing   Problem: Elimination: Goal: Will not experience complications related to urinary retention Outcome: Progressing   Problem: Pain Managment: Goal: General experience of comfort will improve and/or be controlled Outcome: Progressing   Problem: Safety: Goal: Ability to remain free from injury will improve Outcome: Progressing

## 2024-04-06 NOTE — Progress Notes (Signed)
   04/06/24 1415  Spiritual Encounters  Type of Visit Initial  Care provided to: Patient  Referral source Other (comment) (Spiritual Consult)  Reason for visit Advance directives  OnCall Visit No   Chaplain responded to a spiritual consult for advanced directive education. The patient declined requested not sure when he asked for the the information.  Patient, [place] did not wish to speak with a chaplain at this time.    I wished him and enjoyable day as I departed.   Carley [facility]  [phone]

## 2024-04-07 DIAGNOSIS — B9620 Unspecified Escherichia coli [E. coli] as the cause of diseases classified elsewhere: Secondary | ICD-10-CM | POA: Diagnosis not present

## 2024-04-07 DIAGNOSIS — R7881 Bacteremia: Secondary | ICD-10-CM

## 2024-04-07 DIAGNOSIS — R6520 Severe sepsis without septic shock: Secondary | ICD-10-CM | POA: Diagnosis not present

## 2024-04-07 LAB — COMPREHENSIVE METABOLIC PANEL WITH GFR
ALT: 19 U/L (ref 0–44)
AST: 35 U/L (ref 15–41)
Albumin: 3.6 g/dL (ref 3.5–5.0)
Alkaline Phosphatase: 226 U/L — ABNORMAL HIGH (ref 38–126)
Anion gap: 14 (ref 5–15)
BUN: 28 mg/dL — ABNORMAL HIGH (ref 8–23)
CO2: 20 mmol/L — ABNORMAL LOW (ref 22–32)
Calcium: 8.8 mg/dL — ABNORMAL LOW (ref 8.9–10.3)
Chloride: 107 mmol/L (ref 98–111)
Creatinine, Ser: 1.74 mg/dL — ABNORMAL HIGH (ref 0.61–1.24)
GFR, Estimated: 37 mL/min — ABNORMAL LOW (ref >60)
Glucose, Bld: 105 mg/dL — ABNORMAL HIGH (ref 70–99)
Potassium: 4.1 mmol/L (ref 3.5–5.1)
Sodium: 141 mmol/L (ref 135–145)
Total Bilirubin: 0.5 mg/dL (ref 0.0–1.2)
Total Protein: 5.8 g/dL — ABNORMAL LOW (ref 6.5–8.1)

## 2024-04-07 LAB — URINE CULTURE: Culture: >=100000 — AB

## 2024-04-07 LAB — CBC WITH DIFFERENTIAL/PLATELET
Abs Immature Granulocytes: 0.03 K/uL (ref 0.00–0.07)
Basophils Absolute: 0 K/uL (ref 0.0–0.1)
Basophils Relative: 1 %
Eosinophils Absolute: 0.3 K/uL (ref 0.0–0.5)
Eosinophils Relative: 6 %
HCT: 24.9 % — ABNORMAL LOW (ref 39.0–52.0)
Hemoglobin: 7.9 g/dL — ABNORMAL LOW (ref 13.0–17.0)
Immature Granulocytes: 1 %
Lymphocytes Relative: 15 %
Lymphs Abs: 0.9 K/uL (ref 0.7–4.0)
MCH: 28.6 pg (ref 26.0–34.0)
MCHC: 31.7 g/dL (ref 30.0–36.0)
MCV: 90.2 fL (ref 80.0–100.0)
Monocytes Absolute: 0.7 K/uL (ref 0.1–1.0)
Monocytes Relative: 13 %
Neutro Abs: 3.6 K/uL (ref 1.7–7.7)
Neutrophils Relative %: 64 %
Platelets: 178 K/uL (ref 150–400)
RBC: 2.76 MIL/uL — ABNORMAL LOW (ref 4.22–5.81)
RDW: 18.9 % — ABNORMAL HIGH (ref 11.5–15.5)
WBC: 5.6 K/uL (ref 4.0–10.5)
nRBC: 0 % (ref 0.0–0.2)

## 2024-04-07 LAB — CULTURE, BLOOD (ROUTINE X 2)

## 2024-04-07 LAB — MAGNESIUM: Magnesium: 1.9 mg/dL (ref 1.7–2.4)

## 2024-04-07 MED ORDER — QUETIAPINE FUMARATE 25 MG PO TABS
12.5000 mg | ORAL_TABLET | Freq: Once | ORAL | Status: AC
  Administered 2024-04-07: 12.5 mg via ORAL
  Filled 2024-04-07: qty 1

## 2024-04-07 NOTE — TOC Progression Note (Signed)
 Transition of Care [facility]) - Progression Note    Patient Details  Name: Connor Hansen MRN: [id number] Date of Birth: 04-19-1934  Transition of Care [facility] CM/SW Contact  Tawni CHRISTELLA Eva, LCSW Phone Number: 04/07/2024, 1:22 PM  Clinical Narrative:     CSW met with pt to discuss recommendations for rehab. Pt is requesting CSW to speak with his son. CSW spoke with pt's son Ovie Cornelio to discuss recommendations. He stated he would like to see his dad first before making a decision. IPCM to follow.       ADDEN  3:00pm CSW spoke with pt's son he has agreed to placement and would like to return to [facility].   Csw spoke with Randine who has offered pt a bed. CSW to start insurance auth IP care management to follow.           Expected Discharge Plan and Services In-house Referral: NA Discharge Planning Services: CM Consult Post Acute Care Choice: Home Health Living arrangements for the past 2 months: Single Family Home, Skilled Nursing Facility                 DME Arranged: N/A DME Agency: NA       HH Arranged: OT, PT HH Agency: [facility] Date [facility] Agency Contacted: 04/05/24 Time HH Agency Contacted: 1415 Representative spoke with at [facility] Agency: Cindie Sillmon   Social Drivers of Health (SDOH) Interventions SDOH Screenings   Food Insecurity: No Food Insecurity (03/21/2024)  Housing: Low Risk  (03/21/2024)  Transportation Needs: No Transportation Needs (03/21/2024)  Utilities: Not At Risk (03/21/2024)  Social Connections: Patient Unable To Answer (04/05/2024)  Tobacco Use: Medium Risk (04/04/2024)    Readmission Risk Interventions    04/06/2024   10:58 AM  Readmission Risk Prevention Plan  Transportation Screening Complete  Medication Review (RN Care Manager) Complete  PCP or Specialist appointment within 3-5 days of discharge Complete  HRI or Home Care Consult Complete  SW Recovery Care/Counseling Consult Complete  Palliative Care Screening Not  Applicable  Skilled Nursing Facility Complete

## 2024-04-07 NOTE — Plan of Care (Signed)
   Problem: Clinical Measurements: Goal: Diagnostic test results will improve Outcome: Progressing   Problem: Activity: Goal: Risk for activity intolerance will decrease Outcome: Progressing   Problem: Nutrition: Goal: Adequate nutrition will be maintained Outcome: Progressing   Problem: Safety: Goal: Ability to remain free from injury will improve Outcome: Progressing

## 2024-04-07 NOTE — Assessment & Plan Note (Signed)
-   Blood cultures positive from 04/04/2024; recent infection was similar from 03/21/2024 - Again presumed from urinary translocation - Continue Rocephin ; likely needs longer course this time and urology contemplating suppressive antibiotics at discharge as well long-term

## 2024-04-07 NOTE — Progress Notes (Signed)
 Progress Note    Connor Hansen   FMW:[id number]  DOB: [birth year]  DOA: 04/04/2024     3 PCP: Ransom Other, MD  Initial CC: Fevers, weakness, confusion  Hospital Course: Connor Hansen is an 89 y.o. male with PMH HTN, HLD, CAD, CKD 3B, GERD, gout, hypothyroidism, osteoarthritis, rheumatoid arthritis on Enbrel and weekly methotrexate, recently diagnosed prostate cancer, bilateral hydronephrosis s/p indwelling Foley catheter and a recent admission for sepsis and E. coli bacteremia who returns to the ED for evaluation of weakness, confusion and fever.  Per son, patient did very well at rehab and was progressing both physically and neurologically, spent 9 days there and was discharged 8/24.   On the day of admission he suddenly started having fevers as high as 102, chills, weakness and confusion.  Reports patient also had an episode of nausea and vomiting at home and continues to endorse mild abdominal discomfort but no headache, dizziness, shortness of breath, chest pain or change in appetite.  Patient has also had intermittent tremors of the right hand.  He was due to follow-up with Dr. Alvaro on 04/08/2024. He was admitted to the hospital 03/21/2024 through 03/25/2024 with E. coli UTI and bacteremia and was discharged on Keflex .  CT abdomen at that time showed bilateral hydronephrosis and diffuse bone mets.  He was admitted for repeat infectious workup.  Urine culture from 04/04/2024 growing gram-negative rods and blood cultures from admission again growing E. coli presumed translocation from urinary source once again. Urology was also consulted on admission.  Interval History:  Mentation still slow this morning but is oriented. Tentative plan is for discharge to SNF when ready.  Assessment and Plan: * Severe sepsis (HCC)-resolved as of 04/07/2024 - Febrile, tachycardia, tachypnea, lactic acidosis.  Urinary source -Continues to have chronic Foley in place - Continue Rocephin  - Continue following  blood and urine cultures for finalization  E. coli bacteremia - Blood cultures positive from 04/04/2024; recent infection was similar from 03/21/2024 - Again presumed from urinary translocation - Continue Rocephin ; likely needs longer course this time and urology contemplating suppressive antibiotics at discharge as well long-term  Acute cystitis with hematuria-resolved as of 04/07/2024 - Last Foley exchange reported to be 03/25/2024 -Continue Rocephin  - Follow-up further urology recommendations  Chronic kidney disease, stage 3b (HCC) - Likely has developed component of CKD due to outlet obstruction from underlying prostate cancer -Now has Foley in place - Creatinine in July noted 1.89 with GFR low 30s -Creatinine has maintained stability with this baseline; did have some prior worsening during last hospitalization but has now improved back to his prior baseline values -Creatinine 1.74  Acute metabolic encephalopathy-resolved as of 04/06/2024 - Suspected in setting of underlying UTI and bacteremia - Now improved  Chronic systolic CHF (congestive heart failure) (HCC) - no s/s exac at this time - last echo 03/23/24: EF 40 to 45%, presence of RWMA noted; normal diastolic parameters - Troponin 22 initially with some uptrend noted. Denies chest pain; EKG negative for acute changes - suspected demand, no further trop trending   Normocytic anemia - Some worsening from prior baseline, down to 7 g/dL this morning -No overt signs of bleeding.  Etiology suspected in setting of underlying acute illness and malignancy - Repeat hemoglobin in a.m., if lower will transfuse  Prostate cancer metastatic to bone [facility]) - Recent diagnosis and follows with urology - Recent CT A/P on admission, 04/05/2024 shows diffuse heterogenous sclerotic lesions of the axial and appendicular skeleton consistent with osseous metastases  Rheumatoid arthritis (HCC) - Med list being clarified  Hypothyroidism - Continue  Synthroid   HTN (hypertension) - Resume Coreg  - Continue doxazosin   Hyperlipidemia - Continue Lipitor   Old records reviewed in assessment of this patient  Antimicrobials: Vancomycin  04/05/2024 x 1 Rocephin  04/06/2024 >> current  DVT prophylaxis:  enoxaparin  (LOVENOX ) injection 30 mg Start: 04/06/24 1000   Code Status:   Code Status: Limited: Do not attempt resuscitation (DNR) -DNR-LIMITED -Do Not Intubate/DNI   Mobility Assessment (Last 72 Hours)     Mobility Assessment     Row Name 04/06/24 2008 04/06/24 1420 04/06/24 0900 04/05/24 2000 04/05/24 0800   Does the patient have exclusion criteria? No - Perform mobility assessment -- No - Perform mobility assessment No - Perform mobility assessment No - Perform mobility assessment   What is the highest level of mobility based on the mobility assessment? Level 4 (Ambulates with assistance) - Balance while stepping forward/back - Complete Level 4 (Ambulates with assistance) - Balance while stepping forward/back - Complete Level 2 (Chairfast) - Balance while sitting on edge of bed and cannot stand Level 2 (Chairfast) - Balance while sitting on edge of bed and cannot stand Level 2 (Chairfast) - Balance while sitting on edge of bed and cannot stand   Is the above level different from baseline mobility prior to current illness? Yes - Recommend PT order -- Yes - Recommend PT order Yes - Recommend PT order Yes - Recommend PT order    Row Name 04/05/24 0100           Does the patient have exclusion criteria? No - Perform mobility assessment       What is the highest level of mobility based on the mobility assessment? Level 2 (Chairfast) - Balance while sitting on edge of bed and cannot stand       Is the above level different from baseline mobility prior to current illness? Yes - Recommend PT order          Barriers to discharge: none Disposition Plan:  TBD HH orders placed:  Status is: Inpt  Objective: Blood pressure (!) 156/82,  pulse 88, temperature 99.4 F (37.4 C), temperature source Oral, resp. rate 20, height 5' 9 (1.753 m), weight 80 kg, SpO2 94%.  Examination:  Physical Exam Constitutional:      General: He is not in acute distress.    Appearance: Normal appearance.  HENT:     Head: Normocephalic and atraumatic.     Mouth/Throat:     Mouth: Mucous membranes are moist.  Eyes:     Extraocular Movements: Extraocular movements intact.  Cardiovascular:     Rate and Rhythm: Normal rate and regular rhythm.  Pulmonary:     Effort: Pulmonary effort is normal. No respiratory distress.     Breath sounds: Normal breath sounds. No wheezing.  Abdominal:     General: Bowel sounds are normal. There is no distension.     Palpations: Abdomen is soft.     Tenderness: There is no abdominal tenderness.  Genitourinary:    Comments: Foley in place with clear yellow urine in bag Musculoskeletal:        General: Normal range of motion.     Cervical back: Normal range of motion and neck supple.  Skin:    General: Skin is warm and dry.  Neurological:     General: No focal deficit present.     Mental Status: He is alert.  Psychiatric:  Mood and Affect: Mood normal.        Behavior: Behavior normal.      Consultants:  Urology  Procedures:    Data Reviewed: Results for orders placed or performed during the hospital encounter of 04/04/24 (from the past 24 hours)  Troponin T, High Sensitivity     Status: Abnormal   Collection Time: 04/06/24  3:45 PM  Result Value Ref Range   Troponin T High Sensitivity 261 (HH) 0 - 19 ng/L  Troponin T, High Sensitivity     Status: Abnormal   Collection Time: 04/06/24  6:31 PM  Result Value Ref Range   Troponin T High Sensitivity 282 (HH) 0 - 19 ng/L  Comprehensive metabolic panel     Status: Abnormal   Collection Time: 04/07/24  4:26 AM  Result Value Ref Range   Sodium 141 135 - 145 mmol/L   Potassium 4.1 3.5 - 5.1 mmol/L   Chloride 107 98 - 111 mmol/L   CO2 20 (L)  22 - 32 mmol/L   Glucose, Bld 105 (H) 70 - 99 mg/dL   BUN 28 (H) 8 - 23 mg/dL   Creatinine, Ser 8.25 (H) 0.61 - 1.24 mg/dL   Calcium  8.8 (L) 8.9 - 10.3 mg/dL   Total Protein 5.8 (L) 6.5 - 8.1 g/dL   Albumin  3.6 3.5 - 5.0 g/dL   AST 35 15 - 41 U/L   ALT 19 0 - 44 U/L   Alkaline Phosphatase 226 (H) 38 - 126 U/L   Total Bilirubin 0.5 0.0 - 1.2 mg/dL   GFR, Estimated 37 (L) >60 mL/min   Anion gap 14 5 - 15  CBC with Differential/Platelet     Status: Abnormal   Collection Time: 04/07/24  4:26 AM  Result Value Ref Range   WBC 5.6 4.0 - 10.5 K/uL   RBC 2.76 (L) 4.22 - 5.81 MIL/uL   Hemoglobin 7.9 (L) 13.0 - 17.0 g/dL   HCT 75.0 (L) 60.9 - 47.9 %   MCV 90.2 80.0 - 100.0 fL   MCH 28.6 26.0 - 34.0 pg   MCHC 31.7 30.0 - 36.0 g/dL   RDW 81.0 (H) 88.4 - 84.4 %   Platelets 178 150 - 400 K/uL   nRBC 0.0 0.0 - 0.2 %   Neutrophils Relative % 64 %   Neutro Abs 3.6 1.7 - 7.7 K/uL   Lymphocytes Relative 15 %   Lymphs Abs 0.9 0.7 - 4.0 K/uL   Monocytes Relative 13 %   Monocytes Absolute 0.7 0.1 - 1.0 K/uL   Eosinophils Relative 6 %   Eosinophils Absolute 0.3 0.0 - 0.5 K/uL   Basophils Relative 1 %   Basophils Absolute 0.0 0.0 - 0.1 K/uL   Immature Granulocytes 1 %   Abs Immature Granulocytes 0.03 0.00 - 0.07 K/uL  Magnesium     Status: None   Collection Time: 04/07/24  4:26 AM  Result Value Ref Range   Magnesium 1.9 1.7 - 2.4 mg/dL    I have reviewed pertinent nursing notes, vitals, labs, and images as necessary. I have ordered labwork to follow up on as indicated.  I have reviewed the last notes from staff over past 24 hours. I have discussed patient's care plan and test results with nursing staff, CM/SW, and other staff as appropriate.  Time spent: Greater than 50% of the 55 minute visit was spent in counseling/coordination of care for the patient as laid out in the A&P.   LOS: 3 days  Alm Apo, MD [facility] 04/07/2024, 12:56 PM

## 2024-04-07 NOTE — Progress Notes (Signed)
 Physical Therapy Treatment Patient Details Name: Connor Hansen MRN: [id number] DOB: Oct 27, 1933 Today's Date: 04/07/2024   History of Present Illness Pt is 89 yo male admitted on 04/04/24 with severe sepsis.  Pt recently admitted 8/10-8/14 with Ecoli UTI, d/c to rehab, and then home on 8/24.  He developed confusion and fever and returned to hospital 8/24. Pt with hx including but not limited to  HTN, HLD, CAD, CKD 3B, GERD, gout, hypothyroidism, osteoarthritis, rheumatoid arthritis    PT Comments  Pt progressing this session; dizzy with initial sitting however cleared after ~ 30 seconds. tol incr amb distance, remains confused however cognition improved from prior session.  Multi-modal cues needed throughout for safety with mobility. Will keep d/c plan same for now, likely needs ultimate ALF or incr home support.   If plan is discharge home, recommend the following:     Can travel by private vehicle        Equipment Recommendations       Recommendations for Other Services       Precautions / Restrictions Precautions Precautions: Fall Recall of Precautions/Restrictions: Intact Precaution/Restrictions Comments: mac degen, Foley     Mobility  Bed Mobility Overal bed mobility: Needs Assistance Bed Mobility: Supine to Sit     Supine to sit: Min assist     General bed mobility comments: increased time and multi-modal cues for sequencing    Transfers Overall transfer level: Needs assistance Equipment used: Rolling walker (2 wheels) Transfers: Sit to/from Stand, Bed to chair/wheelchair/BSC Sit to Stand: Min assist   Step pivot transfers: Min assist       General transfer comment: Increased time and cues; min A to rise and steady; STS x4 from bed/chair (pt's personal donut cushion in chair)    Ambulation/Gait Ambulation/Gait assistance: Min assist Gait Distance (Feet): 70 Feet Assistive device: Rolling walker (2 wheels) Gait Pattern/deviations: Step-through pattern,  Decreased stride length Gait velocity: decreased     General Gait Details: light assist to steady and manage RW. cues for trunk extension and step length   Stairs             Wheelchair Mobility     Tilt Bed    Modified Rankin (Stroke Patients Only)       Balance                                            Communication Communication Communication: Impaired Factors Affecting Communication: Hearing impaired  Cognition Arousal: Alert Behavior During Therapy: Flat affect (mildly impulsive)   PT - Cognitive impairments: Orientation, Memory, Sequencing   Orientation impairments: Place, Time, Situation                   PT - Cognition Comments: Pt slow to respond although appears improved from prior session,  needs multimodal cues to initiate and cues for sequencing/motor planning; decr safety awareness with regard to lines, reports dizziness however attempting to stand without assist Following commands: Impaired Following commands impaired: Follows one step commands with increased time, Follows multi-step commands inconsistently    Cueing Cueing Techniques: Verbal cues, Gestural cues  Exercises      General Comments        Pertinent Vitals/Pain Pain Assessment Pain Assessment: No/denies pain    Home Living  Prior Function            PT Goals (current goals can now be found in the care plan section) Acute Rehab PT Goals Patient Stated Goal: not stated PT Goal Formulation: With patient Time For Goal Achievement: 04/20/24 Potential to Achieve Goals: Good Progress towards PT goals: Progressing toward goals    Frequency           PT Plan      Co-evaluation              AM-PAC PT 6 Clicks Mobility   Outcome Measure                   End of Session               Time: 8499-8471 PT Time Calculation (min) (ACUTE ONLY): 28 min  Charges:    $Gait Training:  8-22 mins $Therapeutic Activity: 8-22 mins PT General Charges $$ ACUTE PT VISIT: 1 Visit                     Tristen Pennino, PT  Acute Rehab Dept [facility]) [phone]  04/07/2024    [facility] 04/07/2024, 3:51 PM

## 2024-04-07 NOTE — Progress Notes (Signed)
     Subjective: Transition from ICU yesterday.  Doing well on the floor.  Reviewed the case and plan.  He still seems somewhat confused but is improved compared to yesterday.  Objective: Vital signs in last 24 hours: Temp:  [98.3 F (36.8 C)-101.1 F (38.4 C)] 99.2 F (37.3 C) (08/27 1408) Pulse Rate:  [66-99] 89 (08/27 1408) Resp:  [15-22] 20 (08/27 1408) BP: (120-176)/(46-94) 150/94 (08/27 1408) SpO2:  [90 %-97 %] 95 % (08/27 1408)  Assessment/Plan: # Metastatic prostate cancer # Chronic urinary retention #AoCKD   Chronic Foley catheter dependent.  Good UOP.  Serum creatinine appears at baseline.   Patient was seen in clinic on 8/8 with more mildly similar symptoms and was treated empirically with Bactrim.  Culture data shows resistance.   Unfortunately his combination of immunosuppression and catheter necessity are going to open him up to more frequent and severe infection.  Recommend patient to start on nitrofurantoin 100 mg daily to begin once he completes his UTI treatment.    Afebrile x 24 hours.  WBC within normal range.   He remains on androgen deprivation therapy   No acute surgical indication at this time.  Please call with questions.    Intake/Output from previous day: 08/26 0701 - 08/27 0700 In: 3800 [P.O.:950; Blood:2750; IV Piggyback:100] Out: 2425 [Urine:2425]  Intake/Output this shift: Total I/O In: 120 [P.O.:120] Out: 700 [Urine:700]  Physical Exam:  General: Alert and oriented CV: No cyanosis Lungs: equal chest rise Abdomen: Soft, NTND, no rebound or guarding Gu: Catheter in place draining clear yellow urine  Lab Results: Recent Labs    04/05/24 2047 04/06/24 0320 04/07/24 0426  HGB 7.9* 7.0* 7.9*  HCT 23.4* 23.6* 24.9*   BMET Recent Labs    04/06/24 0320 04/07/24 0426  NA 139 141  K 3.9 4.1  CL 109 107  CO2 20* 20*  GLUCOSE 104* 105*  BUN 29* 28*  CREATININE 1.86* 1.74*  CALCIUM  8.3* 8.8*  HGB 7.0* 7.9*  WBC 6.8 5.6      Studies/Results: No results found.    LOS: 3 days   Ole Bourdon, NP [facility] Pager: [phone]  04/07/2024, 2:17 PM

## 2024-04-07 NOTE — NC FL2 (Signed)
 [place]  MEDICAID FL2 LEVEL OF CARE FORM     IDENTIFICATION  Patient Name: Connor Hansen Birthdate: 07-23-1934 Sex: male Admission Date (Current Location): 04/04/2024  [facility] and [place] Number:  Producer, television/film/video and Address:  [facility],  [address], [place] [address]      Provider Number: [id number]  Attending Physician Name and Address:  Connor Lenis, MD  Relative Name and Phone Number:  Jaythan, Hinely [phone])  [phone] [facility])    Current Level of Care: Hospital Recommended Level of Care: Skilled Nursing Facility Prior Approval Number:    Date Approved/Denied:   PASRR Number: [id number]  Discharge Plan: SNF    Current Diagnoses: Patient Active Problem List   Diagnosis Date Noted   E. coli bacteremia 04/07/2024   Chronic kidney disease, stage 3b (HCC) 04/06/2024   Chronic systolic CHF (congestive heart failure) (HCC) 04/06/2024   Altered mental status 04/05/2024   Prostate cancer metastatic to bone (HCC) 04/05/2024   Palliative care encounter 03/24/2024   Prostate cancer (HCC) 03/24/2024   Malignant neoplasm metastatic to bone (HCC) 03/24/2024   Counseling and coordination of care 03/24/2024   Cancer associated pain 03/24/2024   Normocytic anemia 03/22/2024   Cervical disc disease 03/21/2024   Dysthymia 03/21/2024   Gastro-esophageal reflux disease without esophagitis 03/21/2024   Primary gout 03/21/2024   Hypothyroidism 03/21/2024   Long term (current) use of immunomodulator 03/21/2024   Prediabetes 03/21/2024   Pure hypercholesterolemia 03/21/2024   Rheumatoid arthritis (HCC) 03/21/2024   Osteoarthritis 03/21/2024   Stage 3a chronic kidney disease (HCC) 03/21/2024   UTI (urinary tract infection) due to urinary indwelling catheter (HCC) 03/21/2024   AKI (acute kidney injury) (HCC) 03/21/2024   Fever 03/21/2024   Acute encephalopathy 03/21/2024   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   Coronary artery disease due to lipid  rich plaque 07/20/2014   Hyperlipidemia 07/20/2013   HTN (hypertension) 07/20/2013   Angina decubitus (HCC) 07/20/2013   Old MI (myocardial infarction) 07/20/2013    Orientation RESPIRATION BLADDER Height & Weight     Self, Time, Situation, Place  Normal External catheter Weight: 176 lb 5.9 oz (80 kg) Height:  5' 9 (175.3 cm)  BEHAVIORAL SYMPTOMS/MOOD NEUROLOGICAL BOWEL NUTRITION STATUS      Continent Diet (regular)  AMBULATORY STATUS COMMUNICATION OF NEEDS Skin   Limited Assist Verbally Normal                       Personal Care Assistance Level of Assistance  Bathing, Feeding, Dressing Bathing Assistance: Independent Feeding assistance: Limited assistance Dressing Assistance: Independent     Functional Limitations Info  Sight, Hearing, Speech Sight Info: Impaired (Glasses) Hearing Info: Adequate Speech Info: Adequate    SPECIAL CARE FACTORS FREQUENCY  OT (By licensed OT), PT (By licensed PT)     PT Frequency: 5 x a week OT Frequency: 5 x a week            Contractures Contractures Info: Not present    Additional Factors Info  Code Status, Allergies Code Status Info: DNR Allergies Info: no known allergies           Current Medications (04/07/2024):  This is the current hospital active medication list Current Facility-Administered Medications  Medication Dose Route Frequency Provider Last Rate Last Admin   acetaminophen  (TYLENOL ) tablet 650 mg  650 mg Oral Q6H PRN Connor Lenis, MD   650 mg at 04/06/24 2016   Or   acetaminophen  (TYLENOL ) suppository  650 mg  650 mg Rectal Q6H PRN Connor Lenis, MD       allopurinol  (ZYLOPRIM ) tablet 100 mg  100 mg Oral QPM Connor Lenis, MD   100 mg at 04/06/24 1726   aspirin  chewable tablet 81 mg  81 mg Oral QPM Connor Lenis, MD   81 mg at 04/06/24 1726   atorvastatin  (LIPITOR) tablet 80 mg  80 mg Oral QPM Connor Lenis, MD   80 mg at 04/06/24 1726   bisacodyl  (DULCOLAX) EC tablet 5 mg  5 mg Oral Daily PRN  Connor Lenis, MD       carvedilol  (COREG ) tablet 3.125 mg  3.125 mg Oral BID Connor Lenis, MD   3.125 mg at 04/07/24 1231   cefTRIAXone  (ROCEPHIN ) 2 g in sodium chloride  0.9 % 100 mL IVPB  2 g Intravenous Q24H Connor Lenis, MD 200 mL/hr at 04/07/24 1246 2 g at 04/07/24 1246   Chlorhexidine  Gluconate Cloth 2 % PADS 6 each  6 each Topical Daily Connor Lenis, MD   6 each at 04/06/24 1333   doxazosin  (CARDURA ) tablet 2 mg  2 mg Oral QHS Girguis, David, MD   2 mg at 04/06/24 2105   enoxaparin  (LOVENOX ) injection 30 mg  30 mg Subcutaneous Q24H Connor Lenis, MD   30 mg at 04/07/24 1231   famotidine  (PEPCID ) tablet 20 mg  20 mg Oral QHS Girguis, David, MD   20 mg at 04/06/24 2105   feeding supplement (ENSURE PLUS HIGH PROTEIN) liquid 237 mL  237 mL Oral BID BM Connor Lenis, MD   237 mL at 04/07/24 1232   hydrALAZINE  (APRESOLINE ) injection 10 mg  10 mg Intravenous Q8H PRN Connor Lenis, MD       HYDROmorphone  (DILAUDID ) injection 0.5 mg  0.5 mg Intravenous Q6H PRN Connor Lenis, MD       levothyroxine  (SYNTHROID ) tablet 112 mcg  112 mcg Oral Q0600 Connor Lenis, MD   112 mcg at 04/07/24 0606   multivitamin (PROSIGHT) tablet 1 tablet  1 tablet Oral BID Connor Lenis, MD   1 tablet at 04/07/24 1231   ondansetron  (ZOFRAN ) tablet 4 mg  4 mg Oral Q6H PRN Connor Lenis, MD       Or   ondansetron  (ZOFRAN ) injection 4 mg  4 mg Intravenous Q6H PRN Connor Lenis, MD   4 mg at 04/05/24 2337   Oral care mouth rinse  15 mL Mouth Rinse PRN Connor Lenis, MD       oxyCODONE  (Oxy IR/ROXICODONE ) immediate release tablet 5 mg  5 mg Oral Q6H PRN Connor Lenis, MD   5 mg at 04/05/24 1712   oxyCODONE  (OXYCONTIN ) 12 hr tablet 10 mg  10 mg Oral Q12H Girguis, David, MD   10 mg at 04/07/24 1231   senna-docusate (Senokot-S) tablet 1 tablet  1 tablet Oral QHS PRN Connor Lenis, MD       senna-docusate (Senokot-S) tablet 1 tablet  1 tablet Oral QHS Girguis, David, MD   1 tablet at 04/06/24 2105     Discharge  Medications: Please see discharge summary for a list of discharge medications.  Relevant Imaging Results:  Relevant Lab Results:   Additional Information [id number]  Tawni HERO Niana Martorana, LCSW

## 2024-04-07 NOTE — TOC Progression Note (Incomplete Revision)
 Transition of Care [facility]) - Progression Note    Patient Details  Name: Connor Hansen MRN: [id number] Date of Birth: 08/15/1933  Transition of [facility]) CM/SW Contact  Tawni CHRISTELLA Eva, LCSW Phone Number: 04/07/2024, 1:22 PM  Clinical Narrative:     CSW met with pt to discuss recommendations for rehab. Pt is requesting CSW to speak with his son. CSW spoke with pt's son Antuane Eastridge to discuss recommendations. He stated he would like to see his dad first before making a decision. IPCM to follow.       ADDEN  3:00pm CSW spoke with pt's son he has agreed to placement and would like to return to [facility].           Expected Discharge Plan and Services In-house Referral: NA Discharge Planning Services: CM Consult Post Acute Care Choice: Home Health Living arrangements for the past 2 months: Single Family Home, Skilled Nursing Facility                 DME Arranged: N/A DME Agency: NA       HH Arranged: OT, PT HH Agency: [facility] Date [facility] Agency Contacted: 04/05/24 Time HH Agency Contacted: 1415 Representative spoke with at [facility] Agency: Cindie Sillmon   Social Drivers of Health (SDOH) Interventions SDOH Screenings   Food Insecurity: No Food Insecurity (03/21/2024)  Housing: Low Risk  (03/21/2024)  Transportation Needs: No Transportation Needs (03/21/2024)  Utilities: Not At Risk (03/21/2024)  Social Connections: Patient Unable To Answer (04/05/2024)  Tobacco Use: Medium Risk (04/04/2024)    Readmission Risk Interventions    04/06/2024   10:58 AM  Readmission Risk Prevention Plan  Transportation Screening Complete  Medication Review (RN Care Manager) Complete  PCP or Specialist appointment within 3-5 days of discharge Complete  HRI or Home Care Consult Complete  SW Recovery Care/Counseling Consult Complete  Palliative Care Screening Not Applicable  Skilled Nursing Facility Complete

## 2024-04-07 NOTE — Evaluation (Signed)
 Occupational Therapy Evaluation Patient Details Name: Connor Hansen MRN: [id number] DOB: 01-20-1934 Today's Date: 04/07/2024   History of Present Illness   Pt is 89 yr old male admitted on 04/04/24 with severe sepsis.  Pt recently admitted 8/10-8/14 with Ecoli UTI, discharge to rehab, and then home on 8/24.  He developed confusion and fever and returned to hospital 8/24. PMH: HTN, HLD, CAD, CKD 3B, GERD, gout, hypothyroidism, osteoarthritis, rheumatoid arthritis     Clinical Impressions The pt is currently presenting below his reported baseline level of functioning for self care management. He is limited by the below listed deficits (see OT problem list). During the session, he required mod assist for supine to sit, max assist for lower body dressing, and min assist to stand using a RW. He was noted to be with disorientation to place, stating he was located in his neighbor's house. He was also disoriented to time and situation. He was noted to have impaired memory and also required intermittent assistance for problem solving. He will benefit from OT services to maximize his safety and independence with self-care tasks. Patient will benefit from continued inpatient follow up therapy, <3 hours/day.      If plan is discharge home, recommend the following:   A lot of help with walking and/or transfers;A lot of help with bathing/dressing/bathroom;Assistance with cooking/housework;Direct supervision/assist for medications management;Direct supervision/assist for financial management;Assist for transportation;Help with stairs or ramp for entrance;Supervision due to cognitive status     Functional Status Assessment   Patient has had a recent decline in their functional status and demonstrates the ability to make significant improvements in function in a reasonable and predictable amount of time.     Equipment Recommendations   None recommended by OT     Recommendations for Other  Services         Precautions/Restrictions   Precautions Precautions: Fall Restrictions Weight Bearing Restrictions Per Provider Order: No Other Position/Activity Restrictions: foley     Mobility Bed Mobility Overal bed mobility: Needs Assistance Bed Mobility: Supine to Sit, Sit to Supine     Supine to sit: Mod assist, HOB elevated, Used rails Sit to supine: Min assist        Transfers Overall transfer level: Needs assistance Equipment used: Rolling walker (2 wheels) Transfers: Sit to/from Stand Sit to Stand: Min assist             Balance     Sitting balance-Leahy Scale: Fair         Standing balance comment: Min assist with RW                ADL either performed or assessed with clinical judgement   ADL Overall ADL's : Needs assistance/impaired Eating/Feeding: Set up;Sitting   Grooming: Set up;Supervision/safety;Sitting           Upper Body Dressing : Minimal assistance;Cueing for safety;Cueing for sequencing;Sitting   Lower Body Dressing: Moderate assistance;Sitting/lateral leans       Toileting- Clothing Manipulation and Hygiene: Moderate assistance;Cueing for safety;Cueing for sequencing Toileting - Clothing Manipulation Details (indicate cue type and reason): at bedside commode level, based on clinical judgement             Vision Baseline Vision/History: 1 Wears glasses;6 Macular Degeneration Additional Comments: he was unable to decipher time depicted on wall clock            Pertinent Vitals/Pain Pain Assessment Pain Assessment: No/denies pain     Extremity/Trunk Assessment Upper Extremity Assessment Upper Extremity  Assessment: RUE deficits/detail;LUE deficits/detail;Right hand dominant;Generalized weakness RUE Deficits / Details: AROM WFL LUE Deficits / Details: AROM WFL   Lower Extremity Assessment Lower Extremity Assessment: Generalized weakness       Communication Communication Communication:  Impaired Factors Affecting Communication: Hearing impaired   Cognition Arousal: Alert Behavior During Therapy: Flat affect Cognition: Cognition impaired   Orientation impairments: Time, Situation Awareness: Online awareness impaired Memory impairment (select all impairments): Short-term memory, Declarative long-term memory   Executive functioning impairment (select all impairments): Problem solving, Organization OT - Cognition Comments: occasional required time for cognitive processing        Following commands: Impaired Following commands impaired: Follows one step commands with increased time     Cueing  General Comments   Cueing Techniques: Verbal cues;Gestural cues;Tactile cues              Home Living Family/patient expects to be discharged to:: Private residence Living Arrangements: Children (Son) Available Help at Discharge: Family Type of Home: House Home Access: Stairs to enter Secretary/administrator of Steps: 2 Entrance Stairs-Rails: Left;Right Home Layout: Two level               Home Equipment: Agricultural consultant (2 wheels)   Additional Comments: Pt was a questionable historian at times, therefore info reported here may need to be verified.      Prior Functioning/Environment Prior Level of Function : Driving;Independent/Modified Independent             Mobility Comments:  (Pt reported being ambulatory without an assistive device.) ADLs Comments:  (He reported being independent with ADLs and driving. Pt was unable to recall who did the cooking and cleaning at home.)    OT Problem List: Decreased strength;Decreased activity tolerance;Impaired balance (sitting and/or standing);Impaired vision/perception;Decreased coordination;Decreased cognition;Decreased safety awareness;Decreased knowledge of use of DME or AE;Decreased knowledge of precautions   OT Treatment/Interventions: Self-care/ADL training;Therapeutic exercise;Neuromuscular  education;Energy conservation;DME and/or AE instruction;Therapeutic activities;Cognitive remediation/compensation;Visual/perceptual remediation/compensation;Patient/family education;Balance training      OT Goals(Current goals can be found in the care plan section)   Acute Rehab OT Goals OT Goal Formulation: With patient Time For Goal Achievement: 04/21/24 Potential to Achieve Goals: Good ADL Goals Pt Will Perform Upper Body Dressing: with set-up;with supervision;sitting Pt Will Perform Lower Body Dressing: with supervision;with set-up;sitting/lateral leans;sit to/from stand Pt Will Transfer to Toilet: with supervision;ambulating Pt Will Perform Toileting - Clothing Manipulation and hygiene: with supervision;sit to/from stand   OT Frequency:  Min 2X/week       AM-PAC OT 6 Clicks Daily Activity     Outcome Measure Help from another person eating meals?: A Little Help from another person taking care of personal grooming?: A Little Help from another person toileting, which includes using toliet, bedpan, or urinal?: A Lot Help from another person bathing (including washing, rinsing, drying)?: A Lot Help from another person to put on and taking off regular upper body clothing?: A Little Help from another person to put on and taking off regular lower body clothing?: A Lot 6 Click Score: 15   End of Session Equipment Utilized During Treatment: Rolling walker (2 wheels) Nurse Communication: Mobility status  Activity Tolerance: Patient limited by fatigue Patient left: in bed;with call bell/phone within reach;with bed alarm set  OT Visit Diagnosis: Unsteadiness on feet (R26.81);Other abnormalities of gait and mobility (R26.89);History of falling (Z91.81);Muscle weakness (generalized) (M62.81);Low vision, both eyes (H54.2);Other symptoms and signs involving cognitive function  Time: 8374-8357 OT Time Calculation (min): 17 min Charges:  OT General Charges $OT Visit: 1  Visit OT Evaluation $OT Eval Moderate Complexity: 1 Mod    Delanna JINNY Lesches, OTR/L 04/07/2024, 5:28 PM

## 2024-04-08 DIAGNOSIS — R6520 Severe sepsis without septic shock: Secondary | ICD-10-CM | POA: Diagnosis not present

## 2024-04-08 DIAGNOSIS — Z515 Encounter for palliative care: Secondary | ICD-10-CM

## 2024-04-08 DIAGNOSIS — A419 Sepsis, unspecified organism: Secondary | ICD-10-CM

## 2024-04-08 DIAGNOSIS — G9341 Metabolic encephalopathy: Secondary | ICD-10-CM | POA: Diagnosis not present

## 2024-04-08 DIAGNOSIS — N3000 Acute cystitis without hematuria: Secondary | ICD-10-CM

## 2024-04-08 DIAGNOSIS — Z978 Presence of other specified devices: Secondary | ICD-10-CM

## 2024-04-08 DIAGNOSIS — Z7189 Other specified counseling: Secondary | ICD-10-CM

## 2024-04-08 DIAGNOSIS — R7881 Bacteremia: Secondary | ICD-10-CM | POA: Diagnosis not present

## 2024-04-08 LAB — COMPREHENSIVE METABOLIC PANEL WITH GFR
ALT: 28 U/L (ref 0–44)
AST: 54 U/L — ABNORMAL HIGH (ref 15–41)
Albumin: 3.5 g/dL (ref 3.5–5.0)
Alkaline Phosphatase: 229 U/L — ABNORMAL HIGH (ref 38–126)
Anion gap: 15 (ref 5–15)
BUN: 28 mg/dL — ABNORMAL HIGH (ref 8–23)
CO2: 19 mmol/L — ABNORMAL LOW (ref 22–32)
Calcium: 8.9 mg/dL (ref 8.9–10.3)
Chloride: 108 mmol/L (ref 98–111)
Creatinine, Ser: 1.66 mg/dL — ABNORMAL HIGH (ref 0.61–1.24)
GFR, Estimated: 39 mL/min — ABNORMAL LOW (ref >60)
Glucose, Bld: 126 mg/dL — ABNORMAL HIGH (ref 70–99)
Potassium: 4.9 mmol/L (ref 3.5–5.1)
Sodium: 142 mmol/L (ref 135–145)
Total Bilirubin: 0.5 mg/dL (ref 0.0–1.2)
Total Protein: 5.9 g/dL — ABNORMAL LOW (ref 6.5–8.1)

## 2024-04-08 LAB — CBC WITH DIFFERENTIAL/PLATELET
Abs Immature Granulocytes: 0.08 K/uL — ABNORMAL HIGH (ref 0.00–0.07)
Basophils Absolute: 0.1 K/uL (ref 0.0–0.1)
Basophils Relative: 1 %
Eosinophils Absolute: 0.1 K/uL (ref 0.0–0.5)
Eosinophils Relative: 1 %
HCT: 29 % — ABNORMAL LOW (ref 39.0–52.0)
Hemoglobin: 8.7 g/dL — ABNORMAL LOW (ref 13.0–17.0)
Immature Granulocytes: 1 %
Lymphocytes Relative: 23 %
Lymphs Abs: 1.8 K/uL (ref 0.7–4.0)
MCH: 27.3 pg (ref 26.0–34.0)
MCHC: 30 g/dL (ref 30.0–36.0)
MCV: 90.9 fL (ref 80.0–100.0)
Monocytes Absolute: 1.1 K/uL — ABNORMAL HIGH (ref 0.1–1.0)
Monocytes Relative: 13 %
Neutro Abs: 5 K/uL (ref 1.7–7.7)
Neutrophils Relative %: 61 %
Platelets: 207 K/uL (ref 150–400)
RBC: 3.19 MIL/uL — ABNORMAL LOW (ref 4.22–5.81)
RDW: 18.5 % — ABNORMAL HIGH (ref 11.5–15.5)
WBC: 8.1 K/uL (ref 4.0–10.5)
nRBC: 0 % (ref 0.0–0.2)

## 2024-04-08 LAB — MAGNESIUM: Magnesium: 2 mg/dL (ref 1.7–2.4)

## 2024-04-08 MED ORDER — HALOPERIDOL LACTATE 5 MG/ML IJ SOLN: 5.0000 mg | Freq: Four times a day (QID) | INTRAMUSCULAR | Status: DC | PRN

## 2024-04-08 MED ORDER — OXYCODONE HCL 5 MG PO TABS
5.0000 mg | ORAL_TABLET | ORAL | Status: DC | PRN
  Administered 2024-04-09 – 2024-04-10 (×4): 5 mg via ORAL
  Filled 2024-04-08 (×4): qty 1

## 2024-04-08 MED ORDER — PHENOL 1.4 % MT LIQD
1.0000 | OROMUCOSAL | Status: DC | PRN
  Administered 2024-04-08: 1 via OROMUCOSAL
  Filled 2024-04-08: qty 177

## 2024-04-08 MED ORDER — HYDROMORPHONE HCL 1 MG/ML IJ SOLN
0.5000 mg | INTRAMUSCULAR | Status: DC | PRN
  Administered 2024-04-08 – 2024-04-10 (×4): 0.5 mg via INTRAVENOUS
  Filled 2024-04-08 (×4): qty 0.5

## 2024-04-08 MED ORDER — ENOXAPARIN SODIUM 40 MG/0.4ML IJ SOSY
40.0000 mg | PREFILLED_SYRINGE | INTRAMUSCULAR | Status: DC
  Administered 2024-04-08 – 2024-04-10 (×3): 40 mg via SUBCUTANEOUS
  Filled 2024-04-08 (×3): qty 0.4

## 2024-04-08 NOTE — Progress Notes (Signed)
 RN called and ask RT to do an assessment on oxygen needs. He was on 2 Lpm [place], SPO2 was 96%. His RR was normal when I arrived. His family was by bedside stating he was panting. We talked about different techniques to help slow his breathing down. He was 94% on room air. MD contacted about desaturations by RN. No new orders for RT.

## 2024-04-08 NOTE — Assessment & Plan Note (Signed)
-   Multiple discussions held bedside with patient and family during hospitalization.  Given underlying metastatic prostate cancer and guarded prognosis, palliative care has been consulted for GOC discussions along with hospice per family request -Multiple options upon discharge but family leaning towards considering bringing patient home and continuing hospice level care; may not benefit from repeat rehab stay however discussions to be continued between family and palliative care; also at this time given resistance pattern of his bacteremia, we may be continuing hospitalization until antibiotic course is completed if patient going home with hospice

## 2024-04-08 NOTE — Progress Notes (Signed)
 Progress Note    [name]   FMW:[id number]  DOB: 30-Nov-1933  DOA: 04/04/2024     4 PCP: Ransom Other, MD  Initial CC: Fevers, weakness, confusion  Hospital Course: Connor Hansen is an 89 y.o. male with PMH HTN, HLD, CAD, CKD 3B, GERD, gout, hypothyroidism, osteoarthritis, rheumatoid arthritis on Enbrel and weekly methotrexate, recently diagnosed prostate cancer, bilateral hydronephrosis s/p indwelling Foley catheter and a recent admission for sepsis and E. coli bacteremia who returns to the ED for evaluation of weakness, confusion and fever.  Per son, patient did very well at rehab and was progressing both physically and neurologically, spent 9 days there and was discharged 8/24.   On the day of admission he suddenly started having fevers as high as 102, chills, weakness and confusion.  Reports patient also had an episode of nausea and vomiting at home and continues to endorse mild abdominal discomfort but no headache, dizziness, shortness of breath, chest pain or change in appetite.  Patient has also had intermittent tremors of the right hand.  He was due to follow-up with Dr. Alvaro on 04/08/2024. He was admitted to the hospital 03/21/2024 through 03/25/2024 with E. coli UTI and bacteremia and was discharged on Keflex .  CT abdomen at that time showed bilateral hydronephrosis and diffuse bone mets.  He was admitted for repeat infectious workup.  Urine culture from 04/04/2024 growing gram-negative rods and blood cultures from admission again growing E. coli presumed translocation from urinary source once again. Urology was also consulted on admission.  Interval History:  Agitation and confusion overnight requiring restraints and trial of Seroquel . Peer to peer also performed this morning and he was approved in case goes to rehab.  Palliative care consulted due to further discussions warranted around goals of care.  Updated son and DIL bedside; patient remains confused but comfortable and  cooperative with no restraints in place. Ate some b'fast but had to be fed.   Assessment and Plan: * Severe sepsis (HCC)-resolved as of 04/07/2024 - Febrile, tachycardia, tachypnea, lactic acidosis.  Urinary source -Continues to have chronic Foley in place - Continue Rocephin  - Finalized blood and urine cultures reviewed  E. coli bacteremia - Blood cultures positive from 04/04/2024; recent infection was similar from 03/21/2024 - Again presumed from urinary translocation - Continue Rocephin ; after discussion with family, plan will be for completing 10-day course during hospitalization  Chronic indwelling Foley catheter - See plan under bacteremia as well; after course completed will continue on suppressive treatment with nitrofurantoin per urology recommendations -Foley catheter to be exchanged on 8/28  Acute cystitis with hematuria-resolved as of 04/07/2024 - Last Foley exchange reported to be 03/25/2024 -Continue Rocephin ; see bacteremia as well  Goals of care, counseling/discussion - Multiple discussions held bedside with patient and family during hospitalization.  Given underlying metastatic prostate cancer and guarded prognosis, palliative care has been consulted for GOC discussions along with hospice per family request -Multiple options upon discharge but family leaning towards considering bringing patient home and continuing hospice level care; may not benefit from repeat rehab stay however discussions to be continued between family and palliative care; also at this time given resistance pattern of his bacteremia, we may be continuing hospitalization until antibiotic course is completed if patient going home with hospice  Chronic kidney disease, stage 3b (HCC) - Likely has developed component of CKD due to outlet obstruction from underlying prostate cancer - Now has Foley in place - Creatinine in July noted 1.89 with GFR low 30s -Creatinine  has maintained stability with this baseline;  did have some prior worsening during last hospitalization but has now improved back to his prior baseline values -Creatinine stable   Acute metabolic encephalopathy - Suspected in setting of underlying UTI and bacteremia - Improved initially but now having hospital-acquired delirium - Haldol  ordered for further agitation PRN  Chronic systolic CHF (congestive heart failure) (HCC) - no s/s exac at this time - last echo 03/23/24: EF 40 to 45%, presence of RWMA noted; normal diastolic parameters - Troponin 22 initially with some uptrend noted. Denies chest pain; EKG negative for acute changes - suspected demand, no further trop trending   Normocytic anemia - Some worsening from prior baseline, down to 7 g/dL but has since improved naturally -No overt signs of bleeding.  Etiology suspected in setting of underlying acute illness and malignancy - Trending hemoglobin while hospitalized  Prostate cancer metastatic to bone [facility]) - Recent diagnosis and follows with urology - Recent CT A/P on admission, 04/05/2024 shows diffuse heterogenous sclerotic lesions of the axial and appendicular skeleton consistent with osseous metastases  Rheumatoid arthritis (HCC) - Med list being clarified  Hypothyroidism - Continue Synthroid   HTN (hypertension) - Resume Coreg  - Continue doxazosin   Hyperlipidemia - Continue Lipitor   Old records reviewed in assessment of this patient  Antimicrobials: Vancomycin  04/05/2024 x 1 Rocephin  04/06/2024 >> current  DVT prophylaxis:  enoxaparin  (LOVENOX ) injection 40 mg Start: 04/08/24 1000   Code Status:   Code Status: Limited: Do not attempt resuscitation (DNR) -DNR-LIMITED -Do Not Intubate/DNI   Mobility Assessment (Last 72 Hours)     Mobility Assessment     Row Name 04/08/24 0807 04/07/24 1956 04/07/24 1725 04/07/24 0816 04/06/24 2008   Does the patient have exclusion criteria? No - Perform mobility assessment No - Perform mobility assessment -- No -  Perform mobility assessment No - Perform mobility assessment   What is the highest level of mobility based on the mobility assessment? Level 4 (Ambulates with assistance) - Balance while stepping forward/back - Complete Level 4 (Ambulates with assistance) - Balance while stepping forward/back - Complete Level 3 (Stands with assistance) - Balance while standing  and cannot march in place Level 4 (Ambulates with assistance) - Balance while stepping forward/back - Complete Level 4 (Ambulates with assistance) - Balance while stepping forward/back - Complete   Is the above level different from baseline mobility prior to current illness? Yes - Recommend PT order Yes - Recommend PT order -- -- Yes - Recommend PT order    Row Name 04/06/24 1420 04/06/24 0900 04/05/24 2000       Does the patient have exclusion criteria? -- No - Perform mobility assessment No - Perform mobility assessment     What is the highest level of mobility based on the mobility assessment? Level 4 (Ambulates with assistance) - Balance while stepping forward/back - Complete Level 2 (Chairfast) - Balance while sitting on edge of bed and cannot stand Level 2 (Chairfast) - Balance while sitting on edge of bed and cannot stand     Is the above level different from baseline mobility prior to current illness? -- Yes - Recommend PT order Yes - Recommend PT order        Barriers to discharge: none Disposition Plan:  TBD HH orders placed:  Status is: Inpt  Objective: Blood pressure (!) 149/71, pulse 93, temperature 97.7 F (36.5 C), temperature source Oral, resp. rate (!) 25, height 5' 9 (1.753 m), weight 80 kg, SpO2 95%.  Examination:  Physical Exam Constitutional:      General: He is not in acute distress.    Appearance: Normal appearance.  HENT:     Head: Normocephalic and atraumatic.     Mouth/Throat:     Mouth: Mucous membranes are moist.  Eyes:     Extraocular Movements: Extraocular movements intact.  Cardiovascular:      Rate and Rhythm: Normal rate and regular rhythm.  Pulmonary:     Effort: Pulmonary effort is normal. No respiratory distress.     Breath sounds: Normal breath sounds. No wheezing.  Abdominal:     General: Bowel sounds are normal. There is no distension.     Palpations: Abdomen is soft.     Tenderness: There is no abdominal tenderness.  Genitourinary:    Comments: Foley in place with clear yellow urine in bag Musculoskeletal:        General: Normal range of motion.     Cervical back: Normal range of motion and neck supple.  Skin:    General: Skin is warm and dry.  Neurological:     Mental Status: He is alert. He is disoriented.      Consultants:  Urology  Procedures:    Data Reviewed: Results for orders placed or performed during the hospital encounter of 04/04/24 (from the past 24 hours)  Comprehensive metabolic panel     Status: Abnormal   Collection Time: 04/08/24  4:08 AM  Result Value Ref Range   Sodium 142 135 - 145 mmol/L   Potassium 4.9 3.5 - 5.1 mmol/L   Chloride 108 98 - 111 mmol/L   CO2 19 (L) 22 - 32 mmol/L   Glucose, Bld 126 (H) 70 - 99 mg/dL   BUN 28 (H) 8 - 23 mg/dL   Creatinine, Ser 8.33 (H) 0.61 - 1.24 mg/dL   Calcium  8.9 8.9 - 10.3 mg/dL   Total Protein 5.9 (L) 6.5 - 8.1 g/dL   Albumin  3.5 3.5 - 5.0 g/dL   AST 54 (H) 15 - 41 U/L   ALT 28 0 - 44 U/L   Alkaline Phosphatase 229 (H) 38 - 126 U/L   Total Bilirubin 0.5 0.0 - 1.2 mg/dL   GFR, Estimated 39 (L) >60 mL/min   Anion gap 15 5 - 15  CBC with Differential/Platelet     Status: Abnormal   Collection Time: 04/08/24  4:08 AM  Result Value Ref Range   WBC 8.1 4.0 - 10.5 K/uL   RBC 3.19 (L) 4.22 - 5.81 MIL/uL   Hemoglobin 8.7 (L) 13.0 - 17.0 g/dL   HCT 70.9 (L) 60.9 - 47.9 %   MCV 90.9 80.0 - 100.0 fL   MCH 27.3 26.0 - 34.0 pg   MCHC 30.0 30.0 - 36.0 g/dL   RDW 81.4 (H) 88.4 - 84.4 %   Platelets 207 150 - 400 K/uL   nRBC 0.0 0.0 - 0.2 %   Neutrophils Relative % 61 %   Neutro Abs 5.0 1.7 - 7.7  K/uL   Lymphocytes Relative 23 %   Lymphs Abs 1.8 0.7 - 4.0 K/uL   Monocytes Relative 13 %   Monocytes Absolute 1.1 (H) 0.1 - 1.0 K/uL   Eosinophils Relative 1 %   Eosinophils Absolute 0.1 0.0 - 0.5 K/uL   Basophils Relative 1 %   Basophils Absolute 0.1 0.0 - 0.1 K/uL   Immature Granulocytes 1 %   Abs Immature Granulocytes 0.08 (H) 0.00 - 0.07 K/uL  Magnesium  Status: None   Collection Time: 04/08/24  4:08 AM  Result Value Ref Range   Magnesium 2.0 1.7 - 2.4 mg/dL    I have reviewed pertinent nursing notes, vitals, labs, and images as necessary. I have ordered labwork to follow up on as indicated.  I have reviewed the last notes from staff over past 24 hours. I have discussed patient's care plan and test results with nursing staff, CM/SW, and other staff as appropriate.  Time spent: Greater than 50% of the 55 minute visit was spent in counseling/coordination of care for the patient as laid out in the A&P.   LOS: 4 days   Alm Apo, MD [facility] 04/08/2024, 2:56 PM

## 2024-04-08 NOTE — Assessment & Plan Note (Signed)
-   See plan under bacteremia as well; after course completed will continue on suppressive treatment with nitrofurantoin per urology recommendations -Foley catheter to be exchanged on 8/28

## 2024-04-08 NOTE — Progress Notes (Signed)
 Physical Therapy Treatment Patient Details Name: Connor Hansen MRN: [id number] DOB: 12-26-1933 Today's Date: 04/08/2024   History of Present Illness Pt is 89 yo male admitted on 04/04/24 with severe sepsis.  Pt recently admitted 8/10-8/14 with Ecoli UTI, d/c to rehab, and then home on 8/24.  He developed confusion and fever and returned to hospital 8/24. Pt with hx including but not limited to  HTN, HLD, CAD, CKD 3B, GERD, gout, hypothyroidism, osteoarthritis, rheumatoid arthritis    PT Comments  Pt more confused and with decreased cognition today.  Son and daughter in law present and assisted a little with mobilizing.  Pt was agreeable to stand so performed x2 however pt requiring increased assist today (especially compared to previous sessions) and fatigued very quickly.  Family reports needing further discussion regarding goals of care and talking with palliative care team.  Uncertain d/c plan at this time, either SNF with palliative following vs home with hospice pending continued conversations with palliative team.     If plan is discharge home, recommend the following: Two people to help with walking and/or transfers;A lot of help with bathing/dressing/bathroom;Assistance with cooking/housework;Assist for transportation;Help with stairs or ramp for entrance;Supervision due to cognitive status   Can travel by private vehicle        Equipment Recommendations  None recommended by PT    Recommendations for Other Services       Precautions / Restrictions Precautions Precautions: Fall Recall of Precautions/Restrictions: Impaired Precaution/Restrictions Comments: mac degen, Foley     Mobility  Bed Mobility Overal bed mobility: Needs Assistance Bed Mobility: Supine to Sit, Sit to Supine     Supine to sit: Max assist, +2 for safety/equipment Sit to supine: Max assist, +2 for safety/equipment   General bed mobility comments: increased time, multimodal cues required, family also  assisting therapist at times    Transfers Overall transfer level: Needs assistance Equipment used: Rolling walker (2 wheels) Transfers: Sit to/from Stand Sit to Stand: Mod assist, +2 safety/equipment           General transfer comment: multimodal cues for technique, family held down RW, pt requiring assist to rise and stabilize, posterior bias against bed observed today with pt having difficulty finding COG with cues/even physical correction; performed twice; pt fatigued quickly    Ambulation/Gait                   Stairs             Wheelchair Mobility     Tilt Bed    Modified Rankin (Stroke Patients Only)       Balance Overall balance assessment: Needs assistance Sitting-balance support: Bilateral upper extremity supported, Feet supported Sitting balance-Leahy Scale: Zero Sitting balance - Comments: reliant on UE and max external physical support Postural control: Posterior lean Standing balance support: Bilateral upper extremity supported, During functional activity, Reliant on assistive device for balance Standing balance-Leahy Scale: Zero                              Communication Communication Communication: Impaired Factors Affecting Communication: Hearing impaired  Cognition Arousal: Lethargic Behavior During Therapy: Flat affect   PT - Cognitive impairments: Awareness, Memory, Initiation, Attention, Problem solving, Safety/Judgement, Sequencing                       PT - Cognition Comments: cognition appears to have declined since previous session, family present and  also report worse cognition today Following commands: Impaired Following commands impaired: Follows one step commands inconsistently, Follows one step commands with increased time    Cueing Cueing Techniques: Verbal cues, Gestural cues, Tactile cues, Visual cues  Exercises      General Comments        Pertinent Vitals/Pain Pain Assessment Pain  Assessment: Faces Faces Pain Scale: No hurt Pain Intervention(s): Monitored during session, Repositioned    Home Living                          Prior Function            PT Goals (current goals can now be found in the care plan section) Progress towards PT goals: Not progressing toward goals - comment (decreased cognition)    Frequency           PT Plan      Co-evaluation              AM-PAC PT 6 Clicks Mobility   Outcome Measure  Help needed turning from your back to your side while in a flat bed without using bedrails?: Total Help needed moving from lying on your back to sitting on the side of a flat bed without using bedrails?: Total Help needed moving to and from a bed to a chair (including a wheelchair)?: Total Help needed standing up from a chair using your arms (e.g., wheelchair or bedside chair)?: Total Help needed to walk in hospital room?: Total Help needed climbing 3-5 steps with a railing? : Total 6 Click Score: 6    End of Session Equipment Utilized During Treatment: Gait belt Activity Tolerance: Patient limited by fatigue;Patient limited by lethargy Patient left: in bed;with call bell/phone within reach;with bed alarm set   PT Visit Diagnosis: Unsteadiness on feet (R26.81);Muscle weakness (generalized) (M62.81)     Time: 8944-8882 PT Time Calculation (min) (ACUTE ONLY): 22 min  Charges:    $Therapeutic Activity: 8-22 mins PT General Charges $$ ACUTE PT VISIT: 1 Visit                     Connor PT, DPT Physical Therapist Acute Rehabilitation Services Office: [phone]    Connor Hansen 04/08/2024, 3:57 PM

## 2024-04-08 NOTE — Consult Note (Signed)
 Consultation Note Date: 04/08/2024   Patient Name: Connor Hansen  DOB: 07-22-1934  MRN: [id number]  Age / Sex: 89 y.o., male   PCP: Ransom Other, MD Referring Physician: Patsy Lenis, MD  Reason for Consultation: Establishing goals of care     Chief Complaint/History of Present Illness:   Patient is an 89 year old male with a past medical history of hypertension, hyperlipidemia, CAD, CKD stage IIIb, GERD, gout, hypothyroidism, osteoarthritis, rheumatoid arthritis on Enbrel and weekly methotrexate, and recently diagnosed metastatic prostate cancer complicated by bilateral hydronephrosis status post indwelling Foley catheter who was admitted on 04/04/2024 for worsening weakness, confusion, and fever.  Patient had been recently admitted to the hospital from 03/21/2024 through 03/25/2024 with E. coli UTI and bacteremia and had gone to rehab at that time where son reports that patient was doing well and regaining strength.  Unfortunately in the evening after being discharged from rehab earlier that same day, patient developed worsening fever and confusion hence presentation to ER.  During hospitalization patient has received management for severe sepsis secondary to UTI and E. coli bacteremia.  Hospitalization has been complicated by worsening mental status.  Palliative medicine team consulted to assist with complex medical decision making. Of note patient known to palliative medicine provider from recent hospitalization.  Extensive review of EMR including recent documentation from [facility], PT, and hospitalist.  Patient had worsening agitation overnight requiring trial of Seroquel  and use of restraints. Review of recent CMP noting BUN elevated at 28 and creatinine elevated at 1.66 though this is trending down.  Patient's overall GFR estimated at 39. Discussed care with hospitalist and [facility] liaison regarding recent updates.  Family had requested discussion with hospice liaison to learn more about hospice  services.  Noted at this time family undetermined about rehab with palliative versus hospice support, hence palliative medicine consult.  Presented to bedside to see patient.  RN providing care at that time.  Patient has remained confused. Able to speak to patient's son and daughter-in-law separately while RN providing care to confused patient.  Son remembers this provider from prior hospitalization.  Again able to introduce myself as a member of the palliative medicine team and my role in patient's medical journey.  Discussed patient's medical care since last hospitalization.  Patient had been improving at the rehab and regaining strength.  Unfortunately patient deteriorated rapidly on the same day as being discharged from rehab.  Son and daughter-in-law able to describe patient's worsening mental status at this time.  Patient does not appear to be overall improving despite aggressive medical interventions including IV antibiotics for infection. Able to discuss goals for medical care in terms of pathways moving forward.  Family has been considering rehab though very concerned that patient would not tolerate this with worsening medical status and mental status.  Agreed with concerns related to rehab especially since patient was readmitted to the hospital on less than 24 hours from being discharged from rehab last time.  Family noted that priority would be patient's quality of life at this time.  Family does not want patient to suffer in pain.  Acknowledged this.  They noted the patient has stated multiple times he would want to go home and even going to rehab was the goal of getting patient home. At this time patient would still require 5 more days of antibiotics.  Family would like to continue this at this time.  Noted could monitor patient's medical status while required to be here for IV antibiotics to determine about  possibility of patient getting home though with hospice support.  Family familiar with  hospice support though able to discuss philosophy and support it would and would not provide.  Also discussed home hospice versus inpatient hospice and requirements associated with inpatient hospice placement.  Family noting that they have had prior experience with hospice of the [place] and would like to speak with them about possible home hospice support when it is appropriate for patient to discharge from a medical standpoint.  Would pursue this instead of going to rehab as would want patient to have quality time at home.  Acknowledged this. Also discussed that should patient deteriorate during his time here in the hospital while receiving IV antibiotics, may need to consider transition to comfort focused care at that time.  With priority being that patient not suffer, will try to minimize lab work at this time and make sure patient is receiving medications for management of pain and agitation as family would not want patient to hurt himself or be in pain as that was a major fear of the patient.  Spent time answering questions as able.  Noted palliative medicine team will continue to follow along to engage in conversations moving forward.  Able to discuss care with hospitalist, RN, TOC, and hospice of the [facility] liaison to coordinate care.  Primary Diagnoses  Present on Admission:  (Resolved) Severe sepsis (HCC)  Gastro-esophageal reflux disease without esophagitis  HTN (hypertension)  Hyperlipidemia  Hypothyroidism  Rheumatoid arthritis (HCC)  Normocytic anemia   Past Medical History:  Diagnosis Date   CAD (coronary artery disease)    2/10 - BMS to mid LAD, RCA. Anteroapical akinesis - normal EF. 2010   CKD (chronic kidney disease)    Coronary atherosclerosis of native coronary artery    GERD (gastroesophageal reflux disease)    HTN (hypertension)    Hyperlipidemia    Hypothyroidism    Old myocardial infarct    RA (rheumatoid arthritis) (HCC)    Social History   Socioeconomic  History   Marital status: Widowed    Spouse name: Not on file   Number of children: Not on file   Years of education: Not on file   Highest education level: Not on file  Occupational History   Not on file  Tobacco Use   Smoking status: Former    Types: Cigarettes   Smokeless tobacco: Never  Vaping Use   Vaping status: Never Used  Substance and Sexual Activity   Alcohol use: Not Currently   Drug use: No   Sexual activity: Not Currently  Other Topics Concern   Not on file  Social History Narrative   Not on file   Social Drivers of Health   Financial Resource Strain: Not on file  Food Insecurity: No Food Insecurity (03/21/2024)   Hunger Vital Sign    Worried About Running Out of Food in the Last Year: Never true    Ran Out of Food in the Last Year: Never true  Transportation Needs: No Transportation Needs (03/21/2024)   PRAPARE - Administrator, Civil Service (Medical): No    Lack of Transportation (Non-Medical): No  Physical Activity: Not on file  Stress: Not on file  Social Connections: Patient Unable To Answer (04/05/2024)   Social Connection and Isolation Panel    Frequency of Communication with Friends and Family: Patient unable to answer    Frequency of Social Gatherings with Friends and Family: Patient unable to answer    Attends Religious  Services: Patient unable to answer    Active Member of Clubs or Organizations: Patient unable to answer    Attends Club or Organization Meetings: Patient unable to answer    Marital Status: Patient unable to answer   Family History  Problem Relation Age of Onset   Cancer Father    Scheduled Meds:  allopurinol   100 mg Oral QPM   aspirin   81 mg Oral QPM   atorvastatin   80 mg Oral QPM   carvedilol   3.125 mg Oral BID   Chlorhexidine  Gluconate Cloth  6 each Topical Daily   doxazosin   2 mg Oral QHS   enoxaparin  (LOVENOX ) injection  40 mg Subcutaneous Q24H   famotidine   20 mg Oral QHS   feeding supplement  237 mL Oral  BID BM   levothyroxine   112 mcg Oral Q0600   multivitamin  1 tablet Oral BID   oxyCODONE   10 mg Oral Q12H   senna-docusate  1 tablet Oral QHS   Continuous Infusions:  cefTRIAXone  (ROCEPHIN )  IV 2 g (04/07/24 1246)   PRN Meds:.acetaminophen  **OR** acetaminophen , bisacodyl , hydrALAZINE , HYDROmorphone  (DILAUDID ) injection, ondansetron  **OR** ondansetron  (ZOFRAN ) IV, mouth rinse, oxyCODONE , senna-docusate No Known Allergies CBC:    Component Value Date/Time   WBC 8.1 04/08/2024 0408   HGB 8.7 (L) 04/08/2024 0408   HCT 29.0 (L) 04/08/2024 0408   PLT 207 04/08/2024 0408   MCV 90.9 04/08/2024 0408   NEUTROABS 5.0 04/08/2024 0408   LYMPHSABS 1.8 04/08/2024 0408   MONOABS 1.1 (H) 04/08/2024 0408   EOSABS 0.1 04/08/2024 0408   BASOSABS 0.1 04/08/2024 0408   Comprehensive Metabolic Panel:    Component Value Date/Time   NA 142 04/08/2024 0408   NA 142 08/25/2019 1400   K 4.9 04/08/2024 0408   CL 108 04/08/2024 0408   CO2 19 (L) 04/08/2024 0408   BUN 28 (H) 04/08/2024 0408   BUN 14 08/25/2019 1400   CREATININE 1.66 (H) 04/08/2024 0408   GLUCOSE 126 (H) 04/08/2024 0408   CALCIUM  8.9 04/08/2024 0408   AST 54 (H) 04/08/2024 0408   ALT 28 04/08/2024 0408   ALKPHOS 229 (H) 04/08/2024 0408   BILITOT 0.5 04/08/2024 0408   PROT 5.9 (L) 04/08/2024 0408   ALBUMIN  3.5 04/08/2024 0408    Physical Exam: Vital Signs: BP (!) 142/82 (BP Location: Right Arm)   Pulse 88   Temp 98.3 F (36.8 C) (Oral)   Resp (!) 23   Ht 5' 9 (1.753 m)   Wt 80 kg   SpO2 96%   BMI 26.05 kg/m  SpO2: SpO2: 96 % O2 Device: O2 Device: Nasal Cannula O2 Flow Rate: O2 Flow Rate (L/min): 2 L/min Intake/output summary:  Intake/Output Summary (Last 24 hours) at 04/08/2024 1036 Last data filed at 04/08/2024 9057 Gross per 24 hour  Intake 180 ml  Output 1950 ml  Net -1770 ml   LBM: Last BM Date : 04/08/24 Baseline Weight: Weight: 80 kg Most recent weight: Weight: 80 kg  General: Confused, cachectic,  frail Cardiovascular: RRR Respiratory: no increased work of breathing noted, not in respiratory distress Neuro: Confused          Palliative Performance Scale: 30%              Additional Data Reviewed: Recent Labs    04/07/24 0426 04/08/24 0408  WBC 5.6 8.1  HGB 7.9* 8.7*  PLT 178 207  NA 141 142  BUN 28* 28*  CREATININE 1.74* 1.66*    Imaging: CT  ABDOMEN PELVIS WO CONTRAST CLINICAL DATA:  Abdominal/flank pain, stone suspected Eval for suspected renal stone. Metastatic prostate cancer.  EXAM: CT ABDOMEN AND PELVIS WITHOUT CONTRAST  TECHNIQUE: Multidetector CT imaging of the abdomen and pelvis was performed following the standard protocol without IV contrast.  RADIATION DOSE REDUCTION: This exam was performed according to the departmental dose-optimization program which includes automated exposure control, adjustment of the mA and/or kV according to patient size and/or use of iterative reconstruction technique.  COMPARISON:  PET CT 03/17/2024, CT abdomen pelvis 03/21/2024  FINDINGS: Lower chest: Slightly increased bilateral trace pleural effusions, left greater than right. Tiny hiatal hernia.  Hepatobiliary: No focal liver abnormality. Status post cholecystectomy. No biliary dilatation.  Pancreas: Diffusely atrophic. No focal lesion. Otherwise normal pancreatic contour. No surrounding inflammatory changes. No main pancreatic ductal dilatation.  Spleen: Normal in size without focal abnormality.  Adrenals/Urinary Tract:  No adrenal nodule bilaterally.  Bilateral moderate hydroureteronephrosis with urothelial thickening. No ureterolithiasis bilaterally. Punctate right and 4 mm left nephrolithiasis. Fluid dense lesion of the left kidney likely represents a simple renal cyst. Simple renal cysts, in the absence of clinically indicated signs/symptoms, require no independent follow-up.  The urinary bladder is decompressed with a Foley catheter within  its lumen. Circumferential urinary bladder wall thickening likely due to decompression.  Stomach/Bowel: Stomach is within normal limits. No evidence of bowel wall thickening or dilatation. Appendix appears normal.  Vascular/Lymphatic: No abdominal aorta or iliac aneurysm. Severe atherosclerotic plaque of the aorta and its branches. No abdominal, pelvic, or inguinal lymphadenopathy.  Reproductive: Prostate is enlarged measuring up to 5.6 cm.  Other: No intraperitoneal free fluid. No intraperitoneal free gas. No organized fluid collection.  Musculoskeletal:  Small bilateral fat containing inguinal hernias, left greater than right.  No suspicious lytic or blastic osseous lesions. No acute displaced fracture. Diffuse heterogeneous sclerotic lesions of the axial and appendicular skeleton.  IMPRESSION: 1. Bilateral moderate hydronephrosis. This may reflect chronic changes of obstructive uropathy or reflux. Bilateral urothelial thickening and question of mild circumferential urinary bladder wall thickening in the setting of decompression- correlate with urinalysis for infection. 2. Nonobstructive bilateral nephrolithiasis measuring up 4 mm on the left and punctate on the right. 3. Prostatomegaly. 4. Small bilateral fat containing inguinal hernias, left greater than right. 5. Diffuse heterogeneous sclerotic lesions of the axial and appendicular skeleton consistent with osseous metastases. 6. Stable tiny hiatal hernia. 7. Slightly increased bilateral trace pleural effusions, left greater than right. 8.  Aortic Atherosclerosis (ICD10-I70.0).  Electronically Signed   By: Morgane  Naveau M.D.   On: 04/05/2024 17:11    I personally reviewed recent imaging.   Palliative Care Assessment and Plan Summary of Established Goals of Care and Medical Treatment Preferences   Patient is an 89 year old male with a past medical history of hypertension, hyperlipidemia, CAD, CKD stage IIIb,  GERD, gout, hypothyroidism, osteoarthritis, rheumatoid arthritis on Enbrel and weekly methotrexate, and recently diagnosed metastatic prostate cancer complicated by bilateral hydronephrosis status post indwelling Foley catheter who was admitted on 04/04/2024 for worsening weakness, confusion, and fever.  Patient had been recently admitted to the hospital from 03/21/2024 through 03/25/2024 with E. coli UTI and bacteremia and had gone to rehab at that time where son reports that patient was doing well and regaining strength.  Unfortunately in the evening after being discharged from rehab earlier that same day, patient developed worsening fever and confusion hence presentation to ER.  During hospitalization patient has received management for severe sepsis secondary to UTI and E.  coli bacteremia.  Hospitalization has been complicated by worsening mental status.  Palliative medicine team consulted to assist with complex medical decision making. Of note patient known to palliative medicine provider from recent hospitalization.  # Complex medical decision making/goals of care  - Patient unable to participate in complex medical decision making due to medical status.  - Extensive discussion with patient's son and daughter-in-law as detailed above in HPI.  Was informed that patient is not officially married so son is next of kin to assist in decision making.  Discussed possible pathways for medical care moving forward in light of patient's continued deterioration and risk of infection.  Son and daughter-in-law noted that patient's primary goal has always been to get home which is why he even agreed to rehab in the first place.  They would want to continue IV antibiotics at this time for management of patient's infection which is sensitive only to IV antibiotics.  After patient has completed IV antibiotics, they would consider bringing patient home with hospice.  Not planning to pursue rehab at this time.  Should patient  deteriorate before 5 days of antibiotics is completed, would need to discuss transition to comfort focused care fully in the hospital at that time.  Family noted they have dealt with hospice of the [place] previously and request their involvement for potential home hospice support.  Have informed TOC.  Palliative medicine team continuing discussions daily based on patient's continued presentation.    - Of note should patient have issues with agitation or worsening deterioration, family asked to please be called and updated with any changes so they can inform other family about needing to be present.  -  Code Status: Limited: Do not attempt resuscitation (DNR) -DNR-LIMITED -Do Not Intubate/DNI    # Symptom management Patient is receiving these palliative interventions for symptom management with an intent to improve quality of life.   - Patient's family noted that his comfort including management of pain and agitation would be very important to patient at this time.  They are willing to accept patient being sleepy over awake if required for symptom management.  Continuing medications for pain and could consider additional doses of Seroquel  for agitation if needed.  # Psycho-social/Spiritual Support:  - Support System: Son, daughter-in-law  # Discharge Planning:  To Be Determined - At this time not planning to pursue rehab.  Continue IV antibiotics in the hospital to complete course.  Continuing discussions about going home with hospice after completion of IV antibiotics.  Should patient deteriorate during hospitalization, may need to consider transition to comfort focused care at that time.  Thank you for allowing the palliative care team to participate in the care Connor Hansen.  Tinnie Radar, DO Palliative Care Provider PMT # [phone]  If patient remains symptomatic despite maximum doses, please call PMT at [phone] between 0700 and 1900. Outside of these hours, please call attending,  as PMT does not have night coverage.  Personally spent 75 minutes in patient care including extensive chart review (labs, imaging, progress/consult notes, vital signs), medically appropraite exam, discussed with treatment team, education to patient, family, and staff, documenting clinical information, medication review and management, coordination of care, and available advanced directive documents.

## 2024-04-08 NOTE — Progress Notes (Signed)
     Patient Name: DAVION FLANNERY           DOB: [birth year]  MRN: [id number]      Admission Date: 04/04/2024  Attending Provider: Patsy Lenis, MD  Primary Diagnosis: Severe sepsis [facility])   Level of care: Med-Surg   OVERNIGHT EVENT   HPI ARTHER HEISLER, 89 y.o. male, was admitted on 04/04/2024 for Severe sepsis [facility]).  Notified by bedside RN of patient becoming increasingly confused.   Events of Note  Agitated, restless, and confused. Acute metabolic encephalopathy  Mentation waxing and waning.  Started becoming confused earlier in the afternoon. Since then, his behavior has escalated. Currently removing medical equipment, attempting to get out of bed multiple times.  High fall risk.   Unfortunately, he is not following commands despite multiple attempts at redirection by staff.  Unable to be reoriented.   Plan: Will trial Seroquel  for agitation.  Restraint use due to patient's level of agitation and combativeness, he is placing himself and staff at risk.  Restraint to be discontinued as soon as patient is able to safely have them removed.    Eimy Plaza, DNP, ACNPC- AG [facility] [place]

## 2024-04-08 NOTE — TOC Progression Note (Addendum)
 Transition of Care [facility]) - Progression Note    Patient Details  Name: BRINSON TOZZI MRN: [id number] Date of Birth: Sep 29, 1933  Transition of Care [facility]) CM/SW Contact  Tawni CHRISTELLA Eva, LCSW Phone Number: 04/08/2024, 9:25 AM  Clinical Narrative:     Insurance auth pending for SNF placement. IPCM to follow.   ADDEN 12:00pm Peer to Peer was given to MD deadline 4pm . IPCM to follow.   1:00pm Pt is now reccommended for hospice services. CSW has canceled SNF insurance authorization. IP Care management to follow for d/c needs.   Expected Discharge Plan: Skilled Nursing Facility Barriers to Discharge: Continued Medical Work up, SNF Pending bed offer, English as a second language teacher               Expected Discharge Plan and Services In-house Referral: NA Discharge Planning Services: CM Consult Post Acute Care Choice: Home Health Living arrangements for the past 2 months: Single Family Home, Skilled Nursing Facility                 DME Arranged: N/A DME Agency: NA       HH Arranged: OT, PT HH Agency: [facility] Date [facility] Agency Contacted: 04/05/24 Time HH Agency Contacted: 1415 Representative spoke with at [facility] Agency: Cindie Sillmon   Social Drivers of Health (SDOH) Interventions SDOH Screenings   Food Insecurity: No Food Insecurity (03/21/2024)  Housing: Low Risk  (03/21/2024)  Transportation Needs: No Transportation Needs (03/21/2024)  Utilities: Not At Risk (03/21/2024)  Social Connections: Patient Unable To Answer (04/05/2024)  Tobacco Use: Medium Risk (04/04/2024)    Readmission Risk Interventions    04/06/2024   10:58 AM  Readmission Risk Prevention Plan  Transportation Screening Complete  Medication Review (RN Care Manager) Complete  PCP or Specialist appointment within 3-5 days of discharge Complete  HRI or Home Care Consult Complete  SW Recovery Care/Counseling Consult Complete  Palliative Care Screening Not Applicable  Skilled Nursing  Facility Complete

## 2024-04-08 NOTE — Progress Notes (Addendum)
 Occupational Therapy Treatment Patient Details Name: Connor Hansen MRN: [id number] DOB: 10-02-1933 Today's Date: 04/08/2024   History of present illness Pt is 89 yr old male admitted to the hospital on 04/04/24 with severe sepsis.  Pt was recently admitted 8/10-8/14 with Ecoli UTI, discharge to rehab, and then return home on 8/24.  He developed confusion and fever and returned to hospital 8/24. PMH: HTN, HLD, CAD, CKD 3B, GERD, gout, hypothyroidism, osteoarthritis, rheumatoid arthritis   OT comments  The pt was seen for functional strengthening and ADL instruction. His daughter-in-law was present for the duration of the session & was supportive and encouraging throughout. The pt required increased assist, as well as increased time and effort to perform supine to sit. Once seated EOB, he required consistent assist to maintain sitting balance, given generalized weakness, fatigue, and occasional lateral leaning. He was instructed on simple BUE and BLE ROM, requiring intermittent AAROM to perform. He subsequently required min to mod assist to perform simple self-feeding and face washing. Overall, he required increased assistance today, as compared to the prior OT session. He also required increased cues for sequencing, memory/recall, and problem-solving. The pt was assisted back to bed at the end of the session. Continue OT plan of care.       If plan is discharge home, recommend the following:  A lot of help with walking and/or transfers;A lot of help with bathing/dressing/bathroom;Assistance with cooking/housework;Direct supervision/assist for medications management;Direct supervision/assist for financial management;Assist for transportation;Help with stairs or ramp for entrance;Supervision due to cognitive status;Assistance with feeding   Equipment Recommendations  Other (comment) (to be determined)    Recommendations for Other Services      Precautions / Restrictions Precautions Precautions:  Fall Restrictions Weight Bearing Restrictions Per Provider Order: No Other Position/Activity Restrictions: foley       Mobility Bed Mobility Overal bed mobility: Needs Assistance Bed Mobility: Supine to Sit, Sit to Supine     Supine to sit: Max assist, HOB elevated Sit to supine: Max assist   General bed mobility comments:  (required increased time and effort, as well as instruction on sequencing/transfer technique)       Balance     Sitting balance-Leahy Scale: Poor       ADL either performed or assessed with clinical judgement   ADL Overall ADL's : Needs assistance/impaired Eating/Feeding: Minimal assistance;Cueing for sequencing;Sitting Eating/Feeding Details (indicate cue type and reason): He required min hand-over-hand assist, as well as verbal cues for initiation, in order to drink from a cup while seated EOB. Grooming: Moderate assistance;Sitting Grooming Details (indicate cue type and reason): The pt required moderate assist to maintain sitting balance EOB, as well as hand-over-hand assist and cues for initiation and thoroughness with tasks, in order to perform face washing seated EOB.             Lower Body Dressing: Total assistance;Bed level Lower Body Dressing Details (indicate cue type and reason): assist needed to donn socks with the pt in bed                     Vision Baseline Vision/History: 6 Macular Degeneration               Cognition Arousal:  (mild lethargy) Behavior During Therapy: Flat affect Cognition: Cognition impaired   Orientation impairments: Time, Situation, Place Awareness: Online awareness impaired Memory impairment (select all impairments): Short-term memory, Declarative long-term memory, Working memory Attention impairment (select first level of impairment): Alternating attention, Divided attention  Executive functioning impairment (select all impairments): Problem solving, Organization, Sequencing      Following  commands: Impaired Following commands impaired: Follows one step commands inconsistently      Cueing   Cueing Techniques: Verbal cues, Gestural cues, Tactile cues             Pertinent Vitals/ Pain       Pain Assessment Pain Assessment: Faces Pain Score: 3  Pain Location: generalized and intermittent with progressive activity Pain Intervention(s): Repositioned, Monitored during session, Limited activity within patient's tolerance   Frequency  Min 2X/week        Progress Toward Goals  OT Goals(current goals can now be found in the care plan section)     Acute Rehab OT Goals OT Goal Formulation: Patient unable to participate in goal setting Time For Goal Achievement: 04/21/24 Potential to Achieve Goals: Fair  Plan         AM-PAC OT 6 Clicks Daily Activity     Outcome Measure   Help from another person eating meals?: A Little Help from another person taking care of personal grooming?: A Lot Help from another person toileting, which includes using toliet, bedpan, or urinal?: Total Help from another person bathing (including washing, rinsing, drying)?: A Lot Help from another person to put on and taking off regular upper body clothing?: A Lot Help from another person to put on and taking off regular lower body clothing?: Total 6 Click Score: 11    End of Session Equipment Utilized During Treatment: Oxygen  OT Visit Diagnosis: Unsteadiness on feet (R26.81);Other abnormalities of gait and mobility (R26.89);History of falling (Z91.81);Muscle weakness (generalized) (M62.81);Low vision, both eyes (H54.2);Other symptoms and signs involving cognitive function   Activity Tolerance Patient limited by fatigue   Patient Left in bed;with call bell/phone within reach;with chair alarm set;with family/visitor present   Nurse Communication Other (comment)        Time: 8440-8376 OT Time Calculation (min): 24 min  Charges: OT General Charges $OT Visit: 1 Visit OT  Treatments $Self Care/Home Management : 8-22 mins $Therapeutic Activity: 8-22 mins     Delanna JINNY Lesches, OTR/L 04/08/2024, 5:26 PM

## 2024-04-08 NOTE — Progress Notes (Signed)
 [facility] Liasion Note  Per family request, informational visit completed.  Hospital Liaison will continue to follow for any needs.

## 2024-04-08 NOTE — Plan of Care (Signed)

## 2024-04-09 DIAGNOSIS — R6520 Severe sepsis without septic shock: Secondary | ICD-10-CM | POA: Diagnosis not present

## 2024-04-09 DIAGNOSIS — Z515 Encounter for palliative care: Secondary | ICD-10-CM

## 2024-04-09 DIAGNOSIS — Z978 Presence of other specified devices: Secondary | ICD-10-CM | POA: Diagnosis not present

## 2024-04-09 DIAGNOSIS — Z7189 Other specified counseling: Secondary | ICD-10-CM | POA: Diagnosis not present

## 2024-04-09 DIAGNOSIS — G9341 Metabolic encephalopathy: Secondary | ICD-10-CM | POA: Diagnosis not present

## 2024-04-09 DIAGNOSIS — C61 Malignant neoplasm of prostate: Secondary | ICD-10-CM | POA: Diagnosis not present

## 2024-04-09 DIAGNOSIS — R7881 Bacteremia: Secondary | ICD-10-CM | POA: Diagnosis not present

## 2024-04-09 MED ORDER — MAGIC MOUTHWASH
5.0000 mL | Freq: Three times a day (TID) | ORAL | Status: DC
  Administered 2024-04-09 – 2024-04-10 (×6): 5 mL via ORAL
  Filled 2024-04-09 (×7): qty 5

## 2024-04-09 NOTE — Plan of Care (Signed)

## 2024-04-09 NOTE — Progress Notes (Signed)
 Progress Note    Connor Hansen   FMW:[id number]  DOB: July 17, 1934  DOA: 04/04/2024     5 PCP: Connor Other, MD  Initial CC: Fevers, weakness, confusion  Hospital Course: Connor Hansen is an 89 y.o. male with PMH HTN, HLD, CAD, CKD 3B, GERD, gout, hypothyroidism, osteoarthritis, rheumatoid arthritis on Enbrel and weekly methotrexate, recently diagnosed prostate cancer, bilateral hydronephrosis s/p indwelling Foley catheter and a recent admission for sepsis and E. coli bacteremia who returns to the ED for evaluation of weakness, confusion and fever.  Per son, Connor Hansen did very well at rehab and was progressing both physically and neurologically, spent 9 days there and was discharged 8/24.   On the day of admission he suddenly started having fevers as high as 102, chills, weakness and confusion.  Reports Connor Hansen also had an episode of nausea and vomiting at home and continues to endorse mild abdominal discomfort but no headache, dizziness, shortness of breath, chest pain or change in appetite.  Connor Hansen has also had intermittent tremors of the right hand.  He was due to follow-up with Connor Hansen on 04/08/2024. He was admitted to the hospital 03/21/2024 through 03/25/2024 with E. coli UTI and bacteremia and was discharged on Keflex .  CT abdomen at that time showed bilateral hydronephrosis and diffuse bone mets.  He was admitted for repeat infectious workup.  Urine culture from 04/04/2024 growing gram-negative rods and blood cultures from admission again growing E. coli presumed translocation from urinary source once again. Urology was also consulted on admission.  Interval History:  No further agitation but remains confused.  Plan now for finishing out Rocephin  course during hospitalization and then discharge home with hospice on 04/14/2024.   Assessment and Plan: * Severe sepsis (HCC)-resolved as of 04/07/2024 - Febrile, tachycardia, tachypnea, lactic acidosis.  Urinary source -Continues to have chronic  Foley in place - Continue Rocephin  - Finalized blood and urine cultures reviewed  E. coli bacteremia - Blood cultures positive from 04/04/2024; recent infection was similar from 03/21/2024 - Again presumed from urinary translocation - Continue Rocephin ; after discussion with family, plan will be for completing 10-day course during hospitalization; completion date 04/14/2024, then okay for discharge home with hospice  Chronic indwelling Foley catheter - See plan under bacteremia as well; after course completed will continue on suppressive treatment with nitrofurantoin per urology recommendations -Foley catheter exchanged on 8/28  Acute cystitis with hematuria-resolved as of 04/07/2024 - Last Foley exchange reported to be 03/25/2024 -Continue Rocephin ; see bacteremia as well  Goals of care, counseling/discussion - Multiple discussions held bedside with Connor Hansen and family during hospitalization.  Given underlying metastatic prostate cancer and guarded prognosis, palliative care has been consulted for GOC discussions along with hospice per family request - After GOC discussion with palliative care on 8/28, plan is for continuing course of Rocephin  inpatient until complete then bringing Connor Hansen home with hospice  Chronic kidney disease, stage 3b (HCC) - Likely has developed component of CKD due to outlet obstruction from underlying prostate cancer - Now has Foley in place - Creatinine in July noted 1.89 with GFR low 30s -Creatinine has maintained stability with this baseline; did have some prior worsening during last hospitalization but has now improved back to his prior baseline values -Creatinine stable   Acute metabolic encephalopathy - Suspected in setting of underlying UTI and bacteremia - Improved initially but now having hospital-acquired delirium - Haldol  ordered for further agitation PRN  Chronic systolic CHF (congestive heart failure) (HCC) - no s/s exac at  this time - last echo  03/23/24: EF 40 to 45%, presence of RWMA noted; normal diastolic parameters - Troponin 22 initially with some uptrend noted. Denies chest pain; EKG negative for acute changes - suspected demand, no further trop trending   Normocytic anemia - Some worsening from prior baseline, down to 7 g/dL but has since improved naturally -No overt signs of bleeding.  Etiology suspected in setting of underlying acute illness and malignancy - Trending hemoglobin while hospitalized  Prostate cancer metastatic to bone [facility]) - Recent diagnosis and follows with urology - Recent CT A/P on admission, 04/05/2024 shows diffuse heterogenous sclerotic lesions of the axial and appendicular skeleton consistent with osseous metastases  Rheumatoid arthritis (HCC) - Med list being clarified  Hypothyroidism - Continue Synthroid   HTN (hypertension) - Resume Coreg  - Continue doxazosin   Hyperlipidemia - Continue Lipitor   Old records reviewed in assessment of this Connor Hansen  Antimicrobials: Vancomycin  04/05/2024 x 1 Rocephin  04/06/2024 >> current  DVT prophylaxis:  enoxaparin  (LOVENOX ) injection 40 mg Start: 04/08/24 1000   Code Status:   Code Status: Limited: Do not attempt resuscitation (DNR) -DNR-LIMITED -Do Not Intubate/DNI   Mobility Assessment (Last 72 Hours)     Mobility Assessment     Row Name 04/09/24 0742 04/08/24 2248 04/08/24 1723 04/08/24 1552 04/08/24 0807   Does the Connor Hansen have exclusion criteria? No - Perform mobility assessment No - Perform mobility assessment -- -- No - Perform mobility assessment   What is the highest level of mobility based on the mobility assessment? Level 4 (Ambulates with assistance) - Balance while stepping forward/back - Complete Level 1 (Bedfast) - Unable to balance while sitting on edge of bed Level 1 (Bedfast) - Unable to balance while sitting on edge of bed Level 1 (Bedfast) - Unable to balance while sitting on edge of bed Level 4 (Ambulates with assistance) -  Balance while stepping forward/back - Complete   Is the above level different from baseline mobility prior to current illness? Yes - Recommend PT order -- -- -- Yes - Recommend PT order    Row Name 04/07/24 1956 04/07/24 1725 04/07/24 0816 04/06/24 2008 04/06/24 1420   Does the Connor Hansen have exclusion criteria? No - Perform mobility assessment -- No - Perform mobility assessment No - Perform mobility assessment --   What is the highest level of mobility based on the mobility assessment? Level 4 (Ambulates with assistance) - Balance while stepping forward/back - Complete Level 3 (Stands with assistance) - Balance while standing  and cannot march in place Level 4 (Ambulates with assistance) - Balance while stepping forward/back - Complete Level 4 (Ambulates with assistance) - Balance while stepping forward/back - Complete Level 4 (Ambulates with assistance) - Balance while stepping forward/back - Complete   Is the above level different from baseline mobility prior to current illness? Yes - Recommend PT order -- -- Yes - Recommend PT order --      Barriers to discharge: none Disposition Plan:  Home with hospice 9/3 after last dose Rocephin   HH orders placed:  Status is: Inpt  Objective: Blood pressure 127/82, pulse 97, temperature 98.6 F (37 C), temperature source Oral, resp. rate 20, height 5' 9 (1.753 m), weight 80 kg, SpO2 100%.  Examination:  Physical Exam Constitutional:      General: He is not in acute distress.    Appearance: Normal appearance.  HENT:     Head: Normocephalic and atraumatic.     Mouth/Throat:     Mouth: Mucous membranes  are moist.  Eyes:     Extraocular Movements: Extraocular movements intact.  Cardiovascular:     Rate and Rhythm: Normal rate and regular rhythm.  Pulmonary:     Effort: Pulmonary effort is normal. No respiratory distress.     Breath sounds: Normal breath sounds. No wheezing.  Abdominal:     General: Bowel sounds are normal. There is no  distension.     Palpations: Abdomen is soft.     Tenderness: There is no abdominal tenderness.  Genitourinary:    Comments: Foley in place with clear yellow urine in bag Musculoskeletal:        General: Normal range of motion.     Cervical back: Normal range of motion and neck supple.  Skin:    General: Skin is warm and dry.  Neurological:     Mental Status: He is alert. He is disoriented.      Consultants:  Urology  Procedures:    Data Reviewed: No results found for this or any previous visit (from the past 24 hours).   I have reviewed pertinent nursing notes, vitals, labs, and images as necessary. I have ordered labwork to follow up on as indicated.  I have reviewed the last notes from staff over past 24 hours. I have discussed Connor Hansen's care plan and test results with nursing staff, CM/SW, and Hansen staff as appropriate.  Time spent: Greater than 50% of the 55 minute visit was spent in counseling/coordination of care for the Connor Hansen as laid out in the A&P.   LOS: 5 days   Alm Apo, MD [facility] 04/09/2024, 2:03 PM

## 2024-04-09 NOTE — Progress Notes (Signed)
 Daily Progress Note   Patient Name: Connor Hansen       Date: 04/09/2024 DOB: [birth year]  Age: 89 y.o. MRN#: [id number] Attending Physician: Patsy Lenis, MD Primary Care Physician: Ransom Other, MD Admit Date: 04/04/2024 Length of Stay: 5 days  Reason for Consultation/Follow-up: Establishing goals of care  Subjective:   CC: Patient sleeping comfortably when seen.  Following up regarding complex medical decision making.  Subjective:  Reviewed EMR including recent documentation from hospitalist.  At time of EMR review on past 24 hours patient has received as needed IV Dilaudid  0.5 mg x 1 dose and has not received any doses of as needed oxycodone . Review of recent CMP noted creatinine downtrending to 1.66 with GFR 39.  Presented to bedside to see patient.  No visitors present at bedside.  Patient sleeping comfortably so did not awaken.  Discussed care with hospitalist to coordinate care.  Patient would have recommended antibiotic course completed on 05/11/2024.  Plan at this time is for patient to return home with hospitalist after completion of IV antibiotics.  Should patient further deteriorate during hospitalization while receiving IV antibiotics, would need to discuss further with family transition to full comfort care at that time.  Family has already expressed that priority is that patient not be in pain and suffering. Palliative medicine team continuing to follow along to engage in conversations as able and appropriate.  Objective:   Vital Signs:  BP 123/72 (BP Location: Right Arm)   Pulse 94   Temp 98.6 F (37 C) (Oral)   Resp 20   Ht 5' 9 (1.753 m)   Wt 80 kg   SpO2 93%   BMI 26.05 kg/m   Physical Exam: General: Cachectic, frail, elderly, sleeping comfortably Cardiovascular: RRR Respiratory: no increased work of breathing noted, not in respiratory distress Neuro: Sleeping comfortably Assessment & Plan:   Assessment: Patient is an 89 year old male with a past  medical history of hypertension, hyperlipidemia, CAD, CKD stage IIIb, GERD, gout, hypothyroidism, osteoarthritis, rheumatoid arthritis on Enbrel and weekly methotrexate, and recently diagnosed metastatic prostate cancer complicated by bilateral hydronephrosis status post indwelling Foley catheter who was admitted on 04/04/2024 for worsening weakness, confusion, and fever.  Patient had been recently admitted to the hospital from 03/21/2024 through 03/25/2024 with E. coli UTI and bacteremia and had gone to rehab at that time where son reports that patient was doing well and regaining strength.  Unfortunately in the evening after being discharged from rehab earlier that same day, patient developed worsening fever and confusion hence presentation to ER.  During hospitalization patient has received management for severe sepsis secondary to UTI and E. coli bacteremia.  Hospitalization has been complicated by worsening mental status.  Palliative medicine team consulted to assist with complex medical decision making. Of note patient known to palliative medicine provider from recent hospitalization.  Recommendations/Plan: # Complex medical decision making/goals of care:   - Patient unable to participate in complex medical decision making due to medical status.                - Already had extensive discussion with patient's son and daughter-in-law on 04/08/2024.  No visitors present at bedside today at time of visit.  Have been informed that patient is not officially married so son is next of kin to assist in decision making.  Current plan is to continue IV antibiotics at this time for management of patient's infection which is sensitive only to IV antibiotics; hospitalist noted would be completed 04/14/2024.  After patient has completed IV antibiotics, plan to return home with hospice support.  Should patient further deteriorate before IV antibiotic course completed, would need to discuss transition to comfort focused care  fully at that time.  TOC and hospice of the [place] involved to assist with coordination of care.  Palliative medicine team continuing discussions daily based on patient's continued presentation.                                - Of note should patient have issues with agitation or worsening deterioration, family asked to please be called and updated with any changes so they can inform other family about needing to be present.                -  Code Status: Limited: Do not attempt resuscitation (DNR) -DNR-LIMITED -Do Not Intubate/DNI     - Placed signed gold DNR form in patient's room for family who have requested it.  # Symptom management Patient is receiving these palliative interventions for symptom management with an intent to improve quality of life.                 - Patient's family have already noted that his comfort including management of pain and agitation would be very important to patient at this time.  They are willing to accept patient being sleepy over awake if required for symptom management.  Continuing medications for pain and could consider additional doses of Seroquel  for agitation if needed.   # Psycho-social/Spiritual Support:  - Support System: Son, daughter-in-law   # Discharge Planning:  Continue IV antibiotics in the hospital to complete course.  Plan to go home with hospice after completion of IV antibiotics.  Should patient deteriorate during hospitalization, may need to consider transition to comfort focused care at that time.   Thank you for allowing the palliative care team to participate in the care [name].  Tinnie Radar, DO Palliative Care Provider PMT # [phone]  If patient remains symptomatic despite maximum doses, please call PMT at [phone] between 0700 and 1900. Outside of these hours, please call attending, as PMT does not have night coverage.

## 2024-04-10 DIAGNOSIS — Z515 Encounter for palliative care: Secondary | ICD-10-CM | POA: Diagnosis not present

## 2024-04-10 DIAGNOSIS — R4589 Other symptoms and signs involving emotional state: Secondary | ICD-10-CM

## 2024-04-10 DIAGNOSIS — Z978 Presence of other specified devices: Secondary | ICD-10-CM | POA: Diagnosis not present

## 2024-04-10 DIAGNOSIS — R7881 Bacteremia: Secondary | ICD-10-CM | POA: Diagnosis not present

## 2024-04-10 DIAGNOSIS — R6520 Severe sepsis without septic shock: Secondary | ICD-10-CM | POA: Diagnosis not present

## 2024-04-10 DIAGNOSIS — A419 Sepsis, unspecified organism: Secondary | ICD-10-CM | POA: Diagnosis not present

## 2024-04-10 DIAGNOSIS — C61 Malignant neoplasm of prostate: Secondary | ICD-10-CM | POA: Diagnosis not present

## 2024-04-10 LAB — CULTURE, BLOOD (ROUTINE X 2): Culture: NO GROWTH

## 2024-04-10 MED ORDER — POLYETHYLENE GLYCOL 3350 17 G PO PACK
17.0000 g | PACK | Freq: Every day | ORAL | Status: DC
  Administered 2024-04-10: 17 g via ORAL
  Filled 2024-04-10: qty 1

## 2024-04-10 MED ORDER — SENNOSIDES-DOCUSATE SODIUM 8.6-50 MG PO TABS
2.0000 | ORAL_TABLET | Freq: Two times a day (BID) | ORAL | Status: DC
  Administered 2024-04-10 (×2): 2 via ORAL
  Filled 2024-04-10 (×2): qty 2

## 2024-04-10 NOTE — Plan of Care (Signed)
  Problem: Health Behavior/Discharge Planning: Goal: Ability to manage health-related needs will improve 04/10/2024 2312 by Waneta Marylynn SAUNDERS, RN Outcome: Progressing 04/10/2024 2235 by Waneta Marylynn SAUNDERS, RN Outcome: Progressing   Problem: Clinical Measurements: Goal: Ability to maintain clinical measurements within normal limits will improve 04/10/2024 2312 by Waneta Marylynn SAUNDERS, RN Outcome: Progressing 04/10/2024 2235 by Waneta Marylynn SAUNDERS, RN Outcome: Progressing Goal: Will remain free from infection 04/10/2024 2312 by Waneta Marylynn SAUNDERS, RN Outcome: Progressing 04/10/2024 2235 by Waneta Marylynn SAUNDERS, RN Outcome: Progressing Goal: Diagnostic test results will improve Outcome: Progressing   Problem: Health Behavior/Discharge Planning: Goal: Ability to manage health-related needs will improve 04/10/2024 2312 by Waneta Marylynn SAUNDERS, RN Outcome: Progressing 04/10/2024 2235 by Waneta Marylynn SAUNDERS, RN Outcome: Progressing   Problem: Clinical Measurements: Goal: Ability to maintain clinical measurements within normal limits will improve 04/10/2024 2312 by Waneta Marylynn SAUNDERS, RN Outcome: Progressing 04/10/2024 2235 by Waneta Marylynn SAUNDERS, RN Outcome: Progressing Goal: Will remain free from infection 04/10/2024 2312 by Waneta Marylynn SAUNDERS, RN Outcome: Progressing 04/10/2024 2235 by Waneta Marylynn SAUNDERS, RN Outcome: Progressing Goal: Diagnostic test results will improve Outcome: Progressing   Problem: Health Behavior/Discharge Planning: Goal: Ability to manage health-related needs will improve 04/10/2024 2312 by Waneta Marylynn SAUNDERS, RN Outcome: Progressing 04/10/2024 2235 by Waneta Marylynn SAUNDERS, RN Outcome: Progressing   Problem: Clinical Measurements: Goal: Ability to maintain clinical measurements within normal limits will improve 04/10/2024 2312 by Waneta Marylynn SAUNDERS, RN Outcome: Progressing 04/10/2024 2235 by Waneta Marylynn SAUNDERS, RN Outcome: Progressing Goal: Will remain free from infection 04/10/2024  2312 by Waneta Marylynn SAUNDERS, RN Outcome: Progressing 04/10/2024 2235 by Waneta Marylynn SAUNDERS, RN Outcome: Progressing Goal: Diagnostic test results will improve Outcome: Progressing

## 2024-04-10 NOTE — Progress Notes (Signed)
 Daily Progress Note   Patient Name: Connor Hansen       Date: 04/10/2024 DOB: 11-24-1933  Age: 89 y.o. MRN#: [id number] Attending Physician: Christobal Guadalajara, MD Primary Care Physician: Ransom Other, MD Admit Date: 04/04/2024 Length of Stay: 6 days  Reason for Consultation/Follow-up: Establishing goals of care  Subjective:   CC: Patient sitting up in chair at bedside.  Following up regarding complex medical decision making.  Subjective:  Reviewed EMR including recent documentation from hospitalist.  At time of EMR review in past 24 hours patient has received as needed p.o. oxycodone  5 mg x 2 doses and as needed IV Dilaudid  0.5 mg x 2 doses.  Presented to bedside to see patient.  Patient seen sitting up in chair at bedside.  No visitors present at bedside.  Able to discuss care with RN and hospitalist who were present to coordinate care.  Patient awake and interactive.  Plan is for patient to finish IV antibiotics 04/14/2024 and then would be able to discharge home with hospice.  Patient is very much looking forward to getting home when able.  Spent time providing emotional support for active listening.  All questions answered at that time.  Noted bowel medicine team continue to follow patient up regarding  Objective:   Vital Signs:  BP 138/73 (BP Location: Right Arm)   Pulse 92   Temp 99.4 F (37.4 C) (Oral)   Resp 20   Ht 5' 9 (1.753 m)   Wt 80 kg   SpO2 (!) 82%   BMI 26.05 kg/m   Physical Exam: General: Cachectic, frail, elderly, sitting up in chair at bedside Cardiovascular: RRR Respiratory: no increased work of breathing noted, not in respiratory distress Neuro: Awake, interactive, pleasant  Assessment & Plan:   Assessment: Patient is an 89 year old male with a past medical history of hypertension, hyperlipidemia, CAD, CKD stage IIIb, GERD, gout, hypothyroidism, osteoarthritis, rheumatoid arthritis on Enbrel and weekly methotrexate, and recently diagnosed metastatic prostate  cancer complicated by bilateral hydronephrosis status post indwelling Foley catheter who was admitted on 04/04/2024 for worsening weakness, confusion, and fever.  Patient had been recently admitted to the hospital from 03/21/2024 through 03/25/2024 with E. coli UTI and bacteremia and had gone to rehab at that time where son reports that patient was doing well and regaining strength.  Unfortunately in the evening after being discharged from rehab earlier that same day, patient developed worsening fever and confusion hence presentation to ER.  During hospitalization patient has received management for severe sepsis secondary to UTI and E. coli bacteremia.  Hospitalization has been complicated by worsening mental status.  Palliative medicine team consulted to assist with complex medical decision making. Of note patient known to palliative medicine provider from recent hospitalization.  Recommendations/Plan: # Complex medical decision making/goals of care:   - Patient unable to participate in complex medical decision making due to medical status.                - Already had extensive discussion with patient's son and daughter-in-law on 04/08/2024.  No visitors present at bedside today at time of visit.  Have been informed that patient is not officially married so son is next of kin to assist in decision making.  Current plan is to continue IV antibiotics at this time for management of patient's infection which is sensitive only to IV antibiotics; hospitalist noted would be completed 04/14/2024.  After patient has completed IV antibiotics, plan to return home with hospice support.  Should  patient further deteriorate before IV antibiotic course completed, would need to discuss transition to comfort focused care fully at that time.  TOC and hospice of the [place] involved to assist with coordination of care.  Palliative medicine team continuing discussions daily based on patient's continued presentation.                                 - Of note should patient have issues with agitation or worsening deterioration, family asked to please be called and updated with any changes so they can inform other family about needing to be present.                -  Code Status: Limited: Do not attempt resuscitation (DNR) -DNR-LIMITED -Do Not Intubate/DNI     - Already placed signed gold DNR form in patient's room for family who have requested it.  # Symptom management Patient is receiving these palliative interventions for symptom management with an intent to improve quality of life.                 - Patient's family have already noted that his comfort including management of pain and agitation would be very important to patient at this time.  They are willing to accept patient being sleepy over awake if required for symptom management.  Continuing medications for pain and could consider additional doses of Seroquel  for agitation if needed.   # Psycho-social/Spiritual Support:  - Support System: Son, daughter-in-law   # Discharge Planning:  Continue IV antibiotics in the hospital to complete course which will be finished on 04/14/2024.  Plan to go home with hospice after completion of IV antibiotics.  Should patient deteriorate during hospitalization, may need to consider transition to comfort focused care at that time.   Thank you for allowing the palliative care team to participate in the care Connor Hansen.  Tinnie Radar, DO Palliative Care Provider PMT # [phone]  If patient remains symptomatic despite maximum doses, please call PMT at [phone] between 0700 and 1900. Outside of these hours, please call attending, as PMT does not have night coverage.

## 2024-04-10 NOTE — Plan of Care (Signed)
   Problem: Health Behavior/Discharge Planning: Goal: Ability to manage health-related needs will improve Outcome: Progressing   Problem: Clinical Measurements: Goal: Ability to maintain clinical measurements within normal limits will improve Outcome: Progressing Goal: Will remain free from infection Outcome: Progressing Goal: Diagnostic test results will improve Outcome: Progressing

## 2024-04-10 NOTE — Progress Notes (Signed)
 PROGRESS NOTE DERIN GRANQUIST  FMW:[id number] DOB: [birth year] DOA: 04/04/2024 PCP: Ransom Other, MD  Brief Narrative/Hospital Course: 59 yom w/ HTN, HLD, CAD, CKD 3B, GERD, gout, hypothyroidism, osteoarthritis, rheumatoid arthritis on Enbrel and weekly methotrexate, recently diagnosed prostate cancer, bilateral hydronephrosis s/p indwelling Foley catheter and a recent admission for sepsis and E. coli bacteremia-03/21/2024 through 03/25/2024 with E. coli UTI and bacteremia and was discharged on Keflex ,CT abdomen at that time showed bilateral hydronephrosis and diffuse bone mets, returned to the ED for evaluation of weakness, confusion and fever.  Per son, patient did very well at rehab and was progressing both physically and neurologically, spent 9 days there and was discharged 8/24. On the day of admission he suddenly started having fevers as high as 102, chills, weakness and confusion.  Reports patient also had an episode of nausea and vomiting at home and continues to endorse mild abdominal discomfort but no headache, dizziness, shortness of breath, chest pain or change in appetite.  Patient has also had intermittent tremors of the right hand.  He was due to follow-up with Dr. Alvaro on 04/08/2024. He was admitted to the hospital  He was admitted for repeat infectious workup.  Urine culture from 04/04/2024 growing gram-negative rods and blood cultures from admission again growing E. coli presumed translocation from urinary source once again. Urology was also consulted on admission. Seen by palliative care  Subjective: Seen and examined today Overnight afebrile BP stable He is on the bedside chair appears pleasant alert awake interactive [facility] medicine also arrived Labs reviewed from 8/28 creatinine slightly improving hemoglobin fair at 8.7  Assessment and plan:  Severe sepsis POA 2.2 E coli bacteremia Acute cystitis with hematuria: Blood cultures positive from 04/04/2024; recent infection was similar  from 03/21/2024-suspecting urinary source  Sepsis physiology has resolved..  Final urine and blood culture reviewed continue ceftriaxone  X 10 days during hospitalization;  EOT 04/14/2024, then okay for discharge home with hospice   Chronic indwelling Foley catheter After completion of antibiotics, continue on suppressive treatment with nitrofurantoin per urology recommendations Foley catheter exchanged on 8/28    Goals of care, counseling/discussion Multiple discussions held bedside with patient and family during hospitalization.  Given underlying metastatic prostate cancer and guarded prognosis, palliative care has been consulted for GOC discussions along with hospice per family request After GOC discussion with palliative care on 8/28, plan is for continuing course of Rocephin  inpatient until complete then bringing patient home with hospice   CKD 3b  Likely has developed component of CKD due to outlet obstruction from underlying prostate cancer Continue Foley, renal fun ok   Acute metabolic encephalopathy Suspected in setting of underlying UTI and bacteremia, continue delirium and fall precaution.   Chronic systolic CHF Stable. last echo 03/23/24: EF 40 to 45%, presence of RWMA noted; normal diastolic parameters Troponin 22 initially with some uptrend noted. Denies chest pain; EKG negative for acute changes suspected demand, no further trop trending    Normocytic anemia Stable/   Prostate cancer metastatic to bone  Recent diagnosis and follows with urology Recent CT A/P on admission, 04/05/2024 shows diffuse heterogenous sclerotic lesions of the axial and appendicular skeleton consistent with osseous metastases.  Oxycodone  for pain management   Rheumatoid arthritis Med list being clarified   Hypothyroidism Continue Synthroid    HTN:  BP well-controlled on Coreg  low-dose, Cardura     Hyperlipidemia Continue Lipitor  Mobility: PT Orders: Active PT Follow up Rec: Skilled  Nursing-Short Term Rehab (<3 Hours/Day) (Current Plan Is Snf Palliative  Vs Home With Hospice)04/08/2024 1552   DVT prophylaxis: enoxaparin  (LOVENOX ) injection 40 mg Start: 04/08/24 1000 Code Status:   Code Status: Limited: Do not attempt resuscitation (DNR) -DNR-LIMITED -Do Not Intubate/DNI  Family Communication: plan of care discussed with patient/ at bedside. Patient status is: Remains hospitalized because of severity of illness Level of care: Med-Surg   Dispo: The patient is from: home            Anticipated disposition:  home with hospice after completion of antibiotics Objective: Vitals last 24 hrs: Vitals:   04/09/24 1300 04/09/24 2031 04/09/24 2032 04/10/24 0636  BP: 127/82 124/66 124/66 138/73  Pulse: 97 95 95 92  Resp: 20 19  20   Temp:  99 F (37.2 C)  99.4 F (37.4 C)  TempSrc: Axillary Oral  Oral  SpO2: 100% 94%  (!) 82%  Weight:      Height:        Physical Examination: General exam: alert awake, pleasant. HEENT:Oral mucosa moist, Ear/Nose WNL grossly Respiratory system: Bilaterally clear BS,no use of accessory muscle Cardiovascular system: S1 & S2 +, No JVD. Gastrointestinal system: Abdomen soft,NT,ND, BS+ Nervous System: Alert, awake, moving all extremities,and following commands. Extremities: LE edema neg, distal extremities warm.  Skin: No rashes,no icterus. MSK: Normal muscle bulk,tone, power   Medications reviewed:  Scheduled Meds:  allopurinol   100 mg Oral QPM   aspirin   81 mg Oral QPM   atorvastatin   80 mg Oral QPM   carvedilol   3.125 mg Oral BID   Chlorhexidine  Gluconate Cloth  6 each Topical Daily   doxazosin   2 mg Oral QHS   enoxaparin  (LOVENOX ) injection  40 mg Subcutaneous Q24H   famotidine   20 mg Oral QHS   feeding supplement  237 mL Oral BID BM   levothyroxine   112 mcg Oral Q0600   magic mouthwash  5 mL Oral TID   multivitamin  1 tablet Oral BID   oxyCODONE   10 mg Oral Q12H   polyethylene glycol  17 g Oral Daily   senna-docusate  2 tablet  Oral BID   Continuous Infusions:  cefTRIAXone  (ROCEPHIN )  IV 2 g (04/10/24 1106)   Diet: Diet Order             Diet regular Room service appropriate? Yes; Fluid consistency: Thin  Diet effective now                    Data Reviewed: I have personally reviewed following labs and imaging studies ( see epic result tab) CBC: Recent Labs  Lab 04/04/24 2210 04/05/24 0209 04/05/24 2047 04/06/24 0320 04/07/24 0426 04/08/24 0408  WBC 9.5 11.6*  --  6.8 5.6 8.1  NEUTROABS 7.6  --   --   --  3.6 5.0  HGB 7.6* 7.6* 7.9* 7.0* 7.9* 8.7*  HCT 25.1* 24.0* 23.4* 23.6* 24.9* 29.0*  MCV 93.0 91.6  --  93.7 90.2 90.9  PLT 275 220  --  178 178 207   CMP: Recent Labs  Lab 04/04/24 2210 04/05/24 0209 04/06/24 0320 04/07/24 0426 04/08/24 0408  NA 135 138 139 141 142  K 4.2 4.2 3.9 4.1 4.9  CL 106 110 109 107 108  CO2 19* 20* 20* 20* 19*  GLUCOSE 114* 105* 104* 105* 126*  BUN 28* 26* 29* 28* 28*  CREATININE 1.66* 1.64* 1.86* 1.74* 1.66*  CALCIUM  8.4* 8.2* 8.3* 8.8* 8.9  MG  --   --   --  1.9 2.0  GFR: Estimated Creatinine Clearance: 30.8 mL/min (A) (by C-G formula based on SCr of 1.66 mg/dL (H)). Recent Labs  Lab 04/06/24 0320 04/07/24 0426 04/08/24 0408  AST 26 35 54*  ALT 17 19 28   ALKPHOS 185* 226* 229*  BILITOT 0.9 0.5 0.5  PROT 5.7* 5.8* 5.9*  ALBUMIN  3.3* 3.6 3.5   No results for input(s): LIPASE, AMYLASE in the last 168 hours. No results for input(s): AMMONIA in the last 168 hours. Coagulation Profile:  Recent Labs  Lab 04/04/24 2210  INR 1.1   Unresulted Labs (From admission, onward)     Start     Ordered   04/04/24 2355  MRSA Next Gen by PCR, Nasal  Once,   R        04/04/24 2354           Antimicrobials/Microbiology: Anti-infectives (From admission, onward)    Start     Dose/Rate Route Frequency Ordered Stop   04/06/24 1200  cefTRIAXone  (ROCEPHIN ) 2 g in sodium chloride  0.9 % 100 mL IVPB        2 g 200 mL/hr over 30 Minutes Intravenous  Every 24 hours 04/05/24 1242     04/05/24 1330  cefTRIAXone  (ROCEPHIN ) 1 g in sodium chloride  0.9 % 100 mL IVPB        1 g 200 mL/hr over 30 Minutes Intravenous  Once 04/05/24 1242 04/05/24 1359   04/05/24 1200  cefTRIAXone  (ROCEPHIN ) 1 g in sodium chloride  0.9 % 100 mL IVPB  Status:  Discontinued        1 g 200 mL/hr over 30 Minutes Intravenous Every 24 hours 04/05/24 0147 04/05/24 1242   04/04/24 2245  vancomycin  (VANCOREADY) IVPB 1500 mg/300 mL        1,500 mg 150 mL/hr over 120 Minutes Intravenous  Once 04/04/24 2231 04/05/24 0309   04/04/24 2245  piperacillin -tazobactam (ZOSYN ) IVPB 3.375 g        3.375 g 100 mL/hr over 30 Minutes Intravenous  Once 04/04/24 2231 04/05/24 9662         Component Value Date/Time   SDES  04/04/2024 2308    BLOOD BLOOD LEFT HAND Performed at [facility], [address] [place], [place] [address]    SPECREQUEST  04/04/2024 2308    BOTTLES DRAWN AEROBIC AND ANAEROBIC Blood Culture results may not be optimal due to an inadequate volume of blood received in culture bottles Performed at [facility], [address], [place], [place] [address]    CULT ESCHERICHIA COLI (A) 04/04/2024 2308   REPTSTATUS 04/07/2024 FINAL 04/04/2024 2308   Procedures:  Mennie LAMY, MD [facility] 04/10/2024, 11:40 AM

## 2024-04-10 NOTE — Plan of Care (Signed)

## 2024-04-12 NOTE — Progress Notes (Signed)
 Called [facility] to report pt death. Spoke with Celena Courco, attendant. Pt does not meet organ donor criteria. Reference number [id number].

## 2024-04-12 NOTE — Progress Notes (Signed)
 Rounding on pt at 3:05 am, pt noted without pulse or respiration, CPR initiated, the stopped pt is DNR/DNI code status. Immediately called CN and Rapid Response nurse. NP extender called and at beside with with. Pt family called spoke with daughter Karim Aiello. Family coming to hospital. SRP, RN

## 2024-04-12 NOTE — Progress Notes (Signed)
 Patient Placement updated. SRP, RN

## 2024-04-12 NOTE — Progress Notes (Signed)
 Family arrived 0530, and picked up all patient care belongings, glasses, wedding ring, clothing. Pt dentures left in pt mouth. Pt leaving for home and [facility] information submitted: [facility]. [place], [place]. SRP, RN

## 2024-04-12 NOTE — Progress Notes (Addendum)
    OVERNIGHT PROGRESS REPORT  Notified by Rapid response RN for request to bedside and that patient is possibly deceased when RN on rounds.  Patient is Deceased as of as of 0329 hrs   Patient was DNR/DNI followed by Palliative   2 RN/NP verified.  Family is notified and en-route.    Lynwood Kipper MSNA MSN ACNPC-AG Acute Care Nurse Practitioner [facility]

## 2024-04-12 NOTE — Death Summary Note (Signed)
 DEATH SUMMARY   Patient Details  Name: CHADD TOLLISON MRN: [id number] DOB: [birth year] ERE:Yldjpw, Ardell, MD Admission/Discharge Information   Admit Date:  Apr 14, 2024  Date of Death: Date of Death: 04/21/24  Time of Death: Time of Death: 0329  Length of Stay: 7   Principle Cause of death: severe sepsis in the setting of metastatic prostate cancer  Hospital Diagnoses: Active Problems:   E. coli bacteremia   Chronic indwelling Foley catheter   Acute metabolic encephalopathy   Chronic kidney disease, stage 3b (HCC)   Goals of care, counseling/discussion   Normocytic anemia   Chronic systolic CHF (congestive heart failure) (HCC)   Prostate cancer metastatic to bone (HCC)   Hyperlipidemia   HTN (hypertension)   Gastro-esophageal reflux disease without esophagitis   Hypothyroidism   Rheumatoid arthritis (HCC)   Acute cystitis without hematuria   Sepsis (HCC)   Palliative care by specialist   Need for emotional support   Hospital Course: 55 yom w/ HTN, HLD, CAD, CKD 3B, GERD, gout, hypothyroidism, osteoarthritis, rheumatoid arthritis on Enbrel and weekly methotrexate, recently diagnosed prostate cancer, bilateral hydronephrosis s/p indwelling Foley catheter and a recent admission for sepsis and E. coli bacteremia-03/21/2024 through 03/25/2024 with E. coli UTI and bacteremia and was discharged on Keflex ,CT abdomen at that time showed bilateral hydronephrosis and diffuse bone mets, returned to the ED for evaluation of weakness, confusion and fever.  Per son, patient did very well at rehab and was progressing both physically and neurologically, spent 9 days there and was discharged 04-14-2024. On the day of admission he suddenly started having fevers as high as 102, chills, weakness and confusion.  Reports patient also had an episode of nausea and vomiting at home and continues to endorse mild abdominal discomfort but no headache, dizziness, shortness of breath, chest pain or change in  appetite.  Patient has also had intermittent tremors of the right hand.  He was due to follow-up with Dr. Alvaro on 04/08/2024. He was admitted to the hospital  He was admitted for repeat infectious workup.  Urine culture from 04-14-2024 growing gram-negative rods and blood cultures from admission again growing E. coli presumed translocation from urinary source once again. Seen by Urology palliative care. Given metastatic prostate cancer chronic urinary retention with Foley in place, and with combination of immunosuppression and catheter necessity, urology failed patient at risk for more frequent and severe infection. After extensive family meeting plan was to complete IV antibiotics in the hospital and discharge him with home with hospice. 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 Rheumatoid arthritis on immunosuppression Hypothyroidism HTN Hyperlipidemia Anemia of malignancy/coronary disease Hypoalbuminemia CAD/aortic atherosclerosis  Given metastatic prostate cancer chronic urinary retention with Foley  in place, and with combination of immunosuppression and catheter necessity, urology failed patient at risk for  more frequent and severe infection. After extensive family meeting plan was to complete IV antibiotics in the hospital and discharge him with home with hospice. If patient were to deteriorate plan was to discuss for transition to comfort measures  Patient unfortunately passed away during night with nurse at bedside, Family was informed   Consultations:  Palliative care  Urology   The results of significant diagnostics from this hospitalization (including imaging, microbiology, ancillary and laboratory) are listed below for reference.   Significant Diagnostic Studies: CT ABDOMEN PELVIS WO CONTRAST Result Date: 04/05/2024 CLINICAL DATA:  Abdominal/flank pain, stone suspected Eval for suspected renal stone. Metastatic prostate cancer. EXAM: CT ABDOMEN AND PELVIS WITHOUT CONTRAST TECHNIQUE: Multidetector CT imaging of the abdomen and pelvis was performed following the standard protocol without IV contrast. RADIATION DOSE REDUCTION: This exam was performed according to the departmental dose-optimization program which includes automated exposure control, adjustment of the mA and/or kV according to patient size and/or use of iterative reconstruction technique. COMPARISON:  PET CT 03/17/2024, CT abdomen pelvis 03/21/2024 FINDINGS: Lower chest: Slightly increased bilateral trace pleural effusions, left greater than right. Tiny hiatal hernia. Hepatobiliary: No focal liver abnormality. Status post cholecystectomy. No biliary dilatation. Pancreas: Diffusely atrophic. No focal lesion. Otherwise normal pancreatic contour. No surrounding inflammatory changes. No main pancreatic ductal dilatation. Spleen: Normal in size without focal abnormality. Adrenals/Urinary Tract: No adrenal nodule bilaterally. Bilateral moderate hydroureteronephrosis with urothelial thickening. No ureterolithiasis bilaterally. Punctate  right and 4 mm left nephrolithiasis. Fluid dense lesion of the left kidney likely represents a simple renal cyst. Simple renal cysts, in the absence of clinically indicated signs/symptoms, require no independent follow-up. The urinary bladder is decompressed with a Foley catheter within its lumen. Circumferential urinary bladder wall thickening likely due to decompression. Stomach/Bowel: Stomach is within normal limits. No evidence of bowel wall thickening or dilatation. Appendix appears normal. Vascular/Lymphatic: No abdominal aorta or iliac aneurysm. Severe atherosclerotic plaque of the aorta and its branches. No abdominal, pelvic, or inguinal lymphadenopathy. Reproductive: Prostate is enlarged measuring up to 5.6 cm. Other: No intraperitoneal free fluid. No intraperitoneal free gas. No organized fluid collection. Musculoskeletal: Small bilateral fat containing inguinal hernias, left greater than right. No suspicious lytic or blastic osseous lesions. No acute displaced fracture. Diffuse heterogeneous sclerotic lesions of the axial and appendicular skeleton. IMPRESSION: 1. Bilateral moderate hydronephrosis. This may reflect chronic changes of obstructive uropathy or reflux. Bilateral urothelial thickening and question of mild circumferential urinary bladder wall thickening in the setting of decompression- correlate with urinalysis for infection. 2. Nonobstructive bilateral nephrolithiasis measuring up 4 mm on the left and punctate on the right. 3. Prostatomegaly. 4. Small bilateral fat containing inguinal hernias, left greater than right. 5. Diffuse heterogeneous sclerotic lesions of the axial and appendicular skeleton consistent with osseous metastases. 6. Stable tiny hiatal hernia. 7. Slightly increased bilateral trace pleural effusions, left greater than right. 8.  Aortic Atherosclerosis (ICD10-I70.0). Electronically Signed   By: Morgane  Naveau M.D.   On: 04/05/2024 17:11   DG Chest Port 1 View Result Date:  04/04/2024 CLINICAL DATA:  Possible sepsis and weakness EXAM: PORTABLE CHEST 1 VIEW COMPARISON:  03/21/2024 FINDINGS: Cardiac shadow is within normal limits. Lungs are well aerated bilaterally. No focal infiltrate or effusion is seen. No bony abnormality is noted. IMPRESSION: No acute abnormality seen. Electronically Signed   By: Oneil Devonshire M.D.   On: 04/04/2024 22:25   ECHOCARDIOGRAM COMPLETE Result Date: 03/23/2024    ECHOCARDIOGRAM REPORT   Patient Name:   YON SCHIFFMAN  Beichner Date of Exam: 03/23/2024 Medical Rec #:  [id number]      Height:       69.0 in Accession #:    [id number]     Weight:       177.2 lb Date of Birth:  [birth year]      BSA:          1.963 m Patient Age:    89 years       BP:           140/73 mmHg Patient Gender: M              HR:           63 bpm. Exam Location:  Inpatient Procedure: 2D Echo, 3D Echo, Cardiac Doppler and Color Doppler (Both Spectral            and Color Flow Doppler were utilized during procedure). Indications:    Abnormal ECG  History:        Patient has prior history of Echocardiogram examinations, most                 recent 04/27/2019. CAD; Risk Factors:Dyslipidemia and                 Hypertension. CKD Stage 3.  Sonographer:    Philomena Daring Referring Phys: [id number] BINAYA DAHAL IMPRESSIONS  1. Left ventricular ejection fraction, by estimation, is 40 to 45%. The left ventricle has mildly decreased function. The left ventricle demonstrates regional wall motion abnormalities (see scoring diagram/findings for description). Left ventricular diastolic parameters were normal.  2. Right ventricular systolic function is normal. The right ventricular size is normal. There is normal pulmonary artery systolic pressure. The estimated right ventricular systolic pressure is 15.4 mmHg.  3. The mitral valve is grossly normal. Trivial mitral valve regurgitation. No evidence of mitral stenosis.  4. The aortic valve is tricuspid. There is mild calcification of the aortic valve. Aortic valve  regurgitation is not visualized. Aortic valve sclerosis/calcification is present, without any evidence of aortic stenosis.  5. The inferior vena cava is normal in size with greater than 50% respiratory variability, suggesting right atrial pressure of 3 mmHg. Comparison(s): No significant change from prior study. Conclusion(s)/Recommendation(s): Findings consistent with ischemic cardiomyopathy. FINDINGS  Left Ventricle: Left ventricular ejection fraction, by estimation, is 40 to 45%. The left ventricle has mildly decreased function. The left ventricle demonstrates regional wall motion abnormalities. The left ventricular internal cavity size was normal in size. There is no left ventricular hypertrophy. Left ventricular diastolic parameters were normal.  LV Wall Scoring: The mid and distal anterior septum, apical anterior segment, apical inferior segment, and apex are akinetic. Right Ventricle: The right ventricular size is normal. No increase in right ventricular wall thickness. Right ventricular systolic function is normal. There is normal pulmonary artery systolic pressure. The tricuspid regurgitant velocity is 1.76 m/s, and  with an assumed right atrial pressure of 3 mmHg, the estimated right ventricular systolic pressure is 15.4 mmHg. Left Atrium: Left atrial size was normal in size. Right Atrium: Right atrial size was normal in size. Pericardium: Trivial pericardial effusion is present. Mitral Valve: The mitral valve is grossly normal. Trivial mitral valve regurgitation. No evidence of mitral valve stenosis. Tricuspid Valve: The tricuspid valve is grossly normal. Tricuspid valve regurgitation is trivial. No evidence of tricuspid stenosis. Aortic Valve: The aortic valve is tricuspid. There is mild calcification of the aortic valve. Aortic valve regurgitation is not visualized. Aortic valve sclerosis/calcification is present, without  any evidence of aortic stenosis. Aortic valve mean gradient measures 7.5 mmHg.  Aortic valve peak gradient measures 13.8 mmHg. Aortic valve area, by VTI measures 2.19 cm. Pulmonic Valve: The pulmonic valve was grossly normal. Pulmonic valve regurgitation is not visualized. No evidence of pulmonic stenosis. Aorta: The aortic root and ascending aorta are structurally normal, with no evidence of dilitation. Venous: The inferior vena cava is normal in size with greater than 50% respiratory variability, suggesting right atrial pressure of 3 mmHg. IAS/Shunts: The atrial septum is grossly normal.  LEFT VENTRICLE PLAX 2D LVIDd:         4.70 cm     Diastology LVIDs:         3.30 cm     LV e' medial:    8.59 cm/s LV PW:         1.00 cm     LV E/e' medial:  11.8 LV IVS:        1.00 cm     LV e' lateral:   10.70 cm/s LVOT diam:     2.10 cm     LV E/e' lateral: 9.4 LV SV:         91 LV SV Index:   46 LVOT Area:     3.46 cm  LV Volumes (MOD) LV vol d, MOD A2C: 91.0 ml LV vol d, MOD A4C: 99.9 ml LV vol s, MOD A2C: 29.9 ml LV vol s, MOD A4C: 34.5 ml LV SV MOD A2C:     61.1 ml LV SV MOD A4C:     99.9 ml LV SV MOD BP:      65.0 ml RIGHT VENTRICLE             IVC RV S prime:     13.70 cm/s  IVC diam: 1.60 cm TAPSE (M-mode): 2.3 cm LEFT ATRIUM           Index        RIGHT ATRIUM           Index LA diam:      3.40 cm 1.73 cm/m   RA Area:     11.10 cm LA Vol (A2C): 48.6 ml 24.76 ml/m  RA Volume:   20.40 ml  10.39 ml/m LA Vol (A4C): 49.0 ml 24.97 ml/m  AORTIC VALVE AV Area (Vmax):    2.25 cm AV Area (Vmean):   2.24 cm AV Area (VTI):     2.19 cm AV Vmax:           186.00 cm/s AV Vmean:          127.000 cm/s AV VTI:            0.415 m AV Peak Grad:      13.8 mmHg AV Mean Grad:      7.5 mmHg LVOT Vmax:         121.00 cm/s LVOT Vmean:        82.000 cm/s LVOT VTI:          0.262 m LVOT/AV VTI ratio: 0.63  AORTA Ao Root diam: 2.50 cm Ao Asc diam:  3.30 cm MITRAL VALVE                TRICUSPID VALVE MV Area (PHT): 3.74 cm     TR Peak grad:   12.4 mmHg MV Decel Time: 203 msec     TR Vmax:        176.00 cm/s MV E  velocity: 101.00 cm/s MV A velocity: 117.00  cm/s  SHUNTS MV E/A ratio:  0.86         Systemic VTI:  0.26 m                             Systemic Diam: 2.10 cm Darryle Decent MD Electronically signed by Darryle Decent MD Signature Date/Time: 03/23/2024/3:10:51 PM    Final    CT ABDOMEN PELVIS WO CONTRAST Result Date: 03/21/2024 CLINICAL DATA:  Urosepsis. Metastatic prostate carcinoma. * Tracking Code: BO * EXAM: CT ABDOMEN AND PELVIS WITHOUT CONTRAST TECHNIQUE: Multidetector CT imaging of the abdomen and pelvis was performed following the standard protocol without IV contrast. RADIATION DOSE REDUCTION: This exam was performed according to the departmental dose-optimization program which includes automated exposure control, adjustment of the mA and/or kV according to patient size and/or use of iterative reconstruction technique. COMPARISON:  03/02/2024 FINDINGS: Lower chest: New tiny left pleural effusion fall. Hepatobiliary: No mass visualized on this unenhanced exam. Prior cholecystectomy. No evidence of biliary obstruction. Pancreas: No mass or inflammatory process visualized on this unenhanced exam. Spleen:  Within normal limits in size. Adrenals/Urinary tract: Moderate bilateral hydroureteronephrosis is stable, and no ureteral calculi identified. Foley catheter is seen within the bladder which is decompressed. Stomach/Bowel: No evidence of obstruction, inflammatory process, or abnormal fluid collections. Small left inguinal hernia again seen containing a loop of sigmoid colon. Vascular/Lymphatic: No pathologically enlarged lymph nodes identified. No evidence of abdominal aortic aneurysm. Reproductive:  Stable mildly enlarged prostate. Other:  None. Musculoskeletal: Diffuse sclerotic bone metastases are again seen, without significant change. IMPRESSION: Stable moderate bilateral hydroureteronephrosis. No ureteral calculi or other obstructing etiology apparent by CT. Stable mildly enlarged prostate. Stable diffuse  sclerotic bone metastases. New tiny left pleural effusion. Stable small left inguinal hernia. Electronically Signed   By: Norleen DELENA Kil M.D.   On: 03/21/2024 11:01   CT Head Wo Contrast Result Date: 03/21/2024 CLINICAL DATA:  Recent fall.  Confusion.  Altered mental status. EXAM: CT HEAD WITHOUT CONTRAST TECHNIQUE: Contiguous axial images were obtained from the base of the skull through the vertex without intravenous contrast. RADIATION DOSE REDUCTION: This exam was performed according to the departmental dose-optimization program which includes automated exposure control, adjustment of the mA and/or kV according to patient size and/or use of iterative reconstruction technique. COMPARISON:  None Available. FINDINGS: Brain: No evidence of intracranial hemorrhage, acute infarction, hydrocephalus, extra-axial collection, or mass lesion/mass effect. Mild-to-moderate diffuse cerebral atrophy and chronic small vessel disease noted. Vascular:  No hyperdense vessel or other acute findings. Skull: No evidence of fracture or other significant bone abnormality. Sinuses/Orbits:  No acute findings. Other: None. IMPRESSION: No acute intracranial abnormality. Cerebral atrophy and chronic small vessel disease. Electronically Signed   By: Norleen DELENA Kil M.D.   On: 03/21/2024 10:49   DG Chest Port 1 View Result Date: 03/21/2024 EXAM: 1 VIEW XRAY OF THE CHEST 03/21/2024 09:12:59 AM COMPARISON: 2 view chest x-ray 06/17/2018 and CT of the chest 03/02/2024. CLINICAL HISTORY: Questionable sepsis - evaluate for abnormality. Weakness after being diagnosed with UTI two days ago, taking antibiotics for treatment. Diagnosed with pancreatic cancer recently. FINDINGS: LUNGS AND PLEURA: Scarring at the left base is stable. Lung volumes are low. No focal pulmonary opacity. No pulmonary edema. No pleural effusion. No pneumothorax. HEART AND MEDIASTINUM: No acute abnormality of the cardiac and mediastinal silhouettes. BONES AND SOFT TISSUES: No  acute osseous abnormality. IMPRESSION: 1. No acute cardiopulmonary abnormality. Electronically signed by: Lonni Necessary  MD 03/21/2024 09:26 AM EDT RP Workstation: [id number]   NM PET (PSMA) SKULL TO MID THIGH Result Date: 03/18/2024 CLINICAL DATA:  Diffuse osseous sclerosis on CT. Family history of prostate cancer. EXAM: NUCLEAR MEDICINE PET SKULL BASE TO THIGH TECHNIQUE: 8.4 mCi Flotufolastat (Posluma ) was injected intravenously. Full-ring PET imaging was performed from the skull base to thigh after the radiotracer. CT data was obtained and used for attenuation correction and anatomic localization. COMPARISON:  03/02/2024 abdominopelvic CT. FINDINGS: NECK Diminutive low right posterior triangle node with tracer affinity at a S.U.V. max of 3.4 on 42/4. Incidental CT finding: No cervical adenopathy. Carotid atherosclerosis. CHEST No pulmonary parenchymal tracer affinity. Small mediastinal tracer avid nodes. Right infrahilar index node measures a S.U.V. max of 6.3. Incidental CT finding: Aortic and coronary artery calcification. Calcified thoracic nodes are likely related to old granulomatous disease. Tiny bilateral pleural effusions may be physiologic. Subpleural interstitial thickening is mild and could represent interstitial lung disease but is nonspecific in this age group. ABDOMEN/PELVIS Prostate: Eccentric left prostatic tracer affinity, base to apex and large volume. Example at a S.U.V. max of 18.0. Lymph nodes: Abdominal retroperitoneal tracer avid nodes. Example aortocaval node of 6 mm and a S.U.V. max of 9.5 on 133/4. Small tracer avid pelvic nodes including a right external iliac node of 5 mm and a S.U.V. max of 6.8 on 180/4. Liver: No evidence of liver metastasis. Incidental CT finding: Normal adrenal glands. Cholecystectomy. Aortic atherosclerosis. Dominant left renal cyst of 3.3 cm . In the absence of clinically indicated signs/symptoms require(s) no independent follow-up. Moderate bilateral  hydroureteronephrosis is relatively similar. At the level of the pelvic brim, the ureters undergo a relatively gradual transition to decompressed. When compared to the prior contrast-enhanced CT, there is subtle bilateral urothelial wall thickening and/or hyperenhancement in this region (example 56/2 of that exam). Foley catheter within the urinary bladder. Nondependent air is likely secondary. SKELETON Diffuse osseous metastasis. Example left posterior iliac ill-defined sclerotic lesion at a S.U.V. max of 12.4 on 163/4. IMPRESSION: 1. Findings consistent with widespread metastatic prostate cancer, including to bones, nodes of the low neck, chest, abdomen, and pelvis. 2. Similar moderate bilateral hydronephrosis and proximal hydroureter. Transition to normal caliber distal ureters, likely secondary to mid ureteric metastasis versus less likely synchronous urothelial carcinomas. Consider retrograde studies. 3. Tiny bilateral pleural effusions 4. Incidental findings, including: Coronary artery atherosclerosis. Aortic Atherosclerosis (ICD10-I70.0). Electronically Signed   By: Rockey Kilts M.D.   On: 03/18/2024 09:16    Microbiology: Recent Results (from the past 240 hours)  Culture, blood (routine x 2)     Status: None   Collection Time: 04/04/24  4:35 AM   Specimen: BLOOD  Result Value Ref Range Status   Specimen Description   Final    BLOOD SITE NOT SPECIFIED Performed at [facility], [address], [place], [place] [address]    Special Requests   Final    BOTTLES DRAWN AEROBIC AND ANAEROBIC Blood Culture results may not be optimal due to an inadequate volume of blood received in culture bottles Performed at [facility], [address] [place], [place] [address]    Culture   Final    NO GROWTH 5 DAYS Performed at [facility], [address], [place], [place] [address]    Report Status 04/10/2024 FINAL  Final  Resp panel by RT-PCR (RSV, Flu A&B,  Covid) Anterior Nasal Swab     Status: None   Collection Time: 04/04/24 10:28  PM   Specimen: Anterior Nasal Swab  Result Value Ref Range Status   SARS Coronavirus 2 by RT PCR NEGATIVE NEGATIVE Final    Comment: (NOTE) SARS-CoV-2 target nucleic acids are NOT DETECTED.  The SARS-CoV-2 RNA is generally detectable in upper respiratory specimens during the acute phase of infection. The lowest concentration of SARS-CoV-2 viral copies this assay can detect is 138 copies/mL. A negative result does not preclude SARS-Cov-2 infection and should not be used as the sole basis for treatment or other patient management decisions. A negative result may occur with  improper specimen collection/handling, submission of specimen other than nasopharyngeal swab, presence of viral mutation(s) within the areas targeted by this assay, and inadequate number of viral copies(<138 copies/mL). A negative result must be combined with clinical observations, patient history, and epidemiological information. The expected result is Negative.  Fact Sheet for Patients:  BloggerCourse.com  Fact Sheet for Healthcare Providers:  SeriousBroker.it  This test is no t yet approved or cleared by the [place]  FDA and  has been authorized for detection and/or diagnosis of SARS-CoV-2 by FDA under an Emergency Use Authorization (EUA). This EUA will remain  in effect (meaning this test can be used) for the duration of the COVID-19 declaration under Section 564(b)(1) of the Act, 21 U.S.C.section 360bbb-3(b)(1), unless the authorization is terminated  or revoked sooner.       Influenza A by PCR NEGATIVE NEGATIVE Final   Influenza B by PCR NEGATIVE NEGATIVE Final    Comment: (NOTE) The Xpert Xpress SARS-CoV-2/FLU/RSV plus assay is intended as an aid in the diagnosis of influenza from Nasopharyngeal swab specimens and should not be used as a sole basis for treatment. Nasal  washings and aspirates are unacceptable for Xpert Xpress SARS-CoV-2/FLU/RSV testing.  Fact Sheet for Patients: BloggerCourse.com  Fact Sheet for Healthcare Providers: SeriousBroker.it  This test is not yet approved or cleared by the [place]  FDA and has been authorized for detection and/or diagnosis of SARS-CoV-2 by FDA under an Emergency Use Authorization (EUA). This EUA will remain in effect (meaning this test can be used) for the duration of the COVID-19 declaration under Section 564(b)(1) of the Act, 21 U.S.C. section 360bbb-3(b)(1), unless the authorization is terminated or revoked.     Resp Syncytial Virus by PCR NEGATIVE NEGATIVE Final    Comment: (NOTE) Fact Sheet for Patients: [email or web address]  Fact Sheet for Healthcare Providers: SeriousBroker.it  This test is not yet approved or cleared by the [place]  FDA and has been authorized for detection and/or diagnosis of SARS-CoV-2 by FDA under an Emergency Use Authorization (EUA). This EUA will remain in effect (meaning this test can be used) for the duration of the COVID-19 declaration under Section 564(b)(1) of the Act, 21 U.S.C. section 360bbb-3(b)(1), unless the authorization is terminated or revoked.  Performed at [facility], [address], [place], [place] [address]   Urine Culture     Status: Abnormal   Collection Time: 04/04/24 10:29 PM   Specimen: Urine, Random  Result Value Ref Range Status   Specimen Description   Final    URINE, RANDOM Performed at [facility], [address], [place], [place] [address]    Special Requests   Final    NONE Reflexed from [id number] Performed at [facility], [address], [place], [place] [address]    Culture >=100,000 COLONIES/mL ESCHERICHIA COLI (A)  Final   Report Status 04/07/2024 FINAL  Final    Organism ID, Bacteria  ESCHERICHIA COLI (A)  Final      Susceptibility   Escherichia coli - MIC*    AMPICILLIN >=32 RESISTANT Resistant     CEFAZOLIN (URINE) Value in next row Sensitive      4 SENSITIVEThis is a modified FDA-approved test that has been validated and its performance characteristics determined by the reporting laboratory.  This laboratory is certified under the Clinical Laboratory Improvement Amendments CLIA as qualified to perform high complexity clinical laboratory testing.    CEFEPIME Value in next row Sensitive      4 SENSITIVEThis is a modified FDA-approved test that has been validated and its performance characteristics determined by the reporting laboratory.  This laboratory is certified under the Clinical Laboratory Improvement Amendments CLIA as qualified to perform high complexity clinical laboratory testing.    ERTAPENEM Value in next row Sensitive      4 SENSITIVEThis is a modified FDA-approved test that has been validated and its performance characteristics determined by the reporting laboratory.  This laboratory is certified under the Clinical Laboratory Improvement Amendments CLIA as qualified to perform high complexity clinical laboratory testing.    CEFTRIAXONE  Value in next row Sensitive      4 SENSITIVEThis is a modified FDA-approved test that has been validated and its performance characteristics determined by the reporting laboratory.  This laboratory is certified under the Clinical Laboratory Improvement Amendments CLIA as qualified to perform high complexity clinical laboratory testing.    CIPROFLOXACIN Value in next row Resistant      4 SENSITIVEThis is a modified FDA-approved test that has been validated and its performance characteristics determined by the reporting laboratory.  This laboratory is certified under the Clinical Laboratory Improvement Amendments CLIA as qualified to perform high complexity clinical laboratory testing.    GENTAMICIN Value in next row  Sensitive      4 SENSITIVEThis is a modified FDA-approved test that has been validated and its performance characteristics determined by the reporting laboratory.  This laboratory is certified under the Clinical Laboratory Improvement Amendments CLIA as qualified to perform high complexity clinical laboratory testing.    NITROFURANTOIN Value in next row Sensitive      4 SENSITIVEThis is a modified FDA-approved test that has been validated and its performance characteristics determined by the reporting laboratory.  This laboratory is certified under the Clinical Laboratory Improvement Amendments CLIA as qualified to perform high complexity clinical laboratory testing.    TRIMETH/SULFA Value in next row Resistant      4 SENSITIVEThis is a modified FDA-approved test that has been validated and its performance characteristics determined by the reporting laboratory.  This laboratory is certified under the Clinical Laboratory Improvement Amendments CLIA as qualified to perform high complexity clinical laboratory testing.    AMPICILLIN/SULBACTAM Value in next row Sensitive      4 SENSITIVEThis is a modified FDA-approved test that has been validated and its performance characteristics determined by the reporting laboratory.  This laboratory is certified under the Clinical Laboratory Improvement Amendments CLIA as qualified to perform high complexity clinical laboratory testing.    PIP/TAZO Value in next row Sensitive ug/mL     <=4 SENSITIVEThis is a modified FDA-approved test that has been validated and its performance characteristics determined by the reporting laboratory.  This laboratory is certified under the Clinical Laboratory Improvement Amendments CLIA as qualified to perform high complexity clinical laboratory testing.    MEROPENEM Value in next row Sensitive      <=4 SENSITIVEThis is a modified FDA-approved test that has  been validated and its performance characteristics determined by the reporting  laboratory.  This laboratory is certified under the Clinical Laboratory Improvement Amendments CLIA as qualified to perform high complexity clinical laboratory testing.    * >=100,000 COLONIES/mL ESCHERICHIA COLI  Culture, blood (routine x 2)     Status: Abnormal   Collection Time: 04/04/24 11:08 PM   Specimen: BLOOD  Result Value Ref Range Status   Specimen Description   Final    BLOOD BLOOD LEFT HAND Performed at [facility], [address] [place], [place] [address]    Special Requests   Final    BOTTLES DRAWN AEROBIC AND ANAEROBIC Blood Culture results may not be optimal due to an inadequate volume of blood received in culture bottles Performed at [facility], [address] [place], [place] [address]    Culture  Setup Time   Final    GRAM NEGATIVE RODS ANAEROBIC BOTTLE ONLY CRITICAL RESULT CALLED TO, READ BACK BY AND VERIFIED WITH: PHARMD NICK GLOGOVAC ON 04/05/24 @ 1230 BY DRT  CRITICAL RESULT CALLED TO, READ BACK BY AND VERIFIED WITH: PHARMD SYDNEY DAVIS ON 04/05/24 @ 1417 BY DRT Performed at [facility], [address], [place], [place] [address]    Culture ESCHERICHIA COLI (A)  Final   Report Status 04/07/2024 FINAL  Final   Organism ID, Bacteria ESCHERICHIA COLI  Final      Susceptibility   Escherichia coli - MIC*    AMPICILLIN >=32 RESISTANT Resistant     CEFAZOLIN (NON-URINE) 8 RESISTANT Resistant     CEFEPIME <=0.12 SENSITIVE Sensitive     ERTAPENEM <=0.12 SENSITIVE Sensitive     CEFTRIAXONE  <=0.25 SENSITIVE Sensitive     CIPROFLOXACIN >=4 RESISTANT Resistant     GENTAMICIN <=1 SENSITIVE Sensitive     MEROPENEM <=0.25 SENSITIVE Sensitive     TRIMETH/SULFA >=320 RESISTANT Resistant     AMPICILLIN/SULBACTAM 8 SENSITIVE Sensitive     PIP/TAZO Value in next row Sensitive ug/mL     <=4 SENSITIVEThis is a modified FDA-approved test that has been validated and its performance characteristics determined by the reporting  laboratory.  This laboratory is certified under the Clinical Laboratory Improvement Amendments CLIA as qualified to perform high complexity clinical laboratory testing.    * ESCHERICHIA COLI  Blood Culture ID Panel (Reflexed)     Status: Abnormal   Collection Time: 04/04/24 11:08 PM  Result Value Ref Range Status   Enterococcus faecalis NOT DETECTED NOT DETECTED Final   Enterococcus Faecium NOT DETECTED NOT DETECTED Final   Listeria monocytogenes NOT DETECTED NOT DETECTED Final   Staphylococcus species NOT DETECTED NOT DETECTED Final   Staphylococcus aureus (BCID) NOT DETECTED NOT DETECTED Final   Staphylococcus epidermidis NOT DETECTED NOT DETECTED Final   Staphylococcus lugdunensis NOT DETECTED NOT DETECTED Final   Streptococcus species NOT DETECTED NOT DETECTED Final   Streptococcus agalactiae NOT DETECTED NOT DETECTED Final   Streptococcus pneumoniae NOT DETECTED NOT DETECTED Final   Streptococcus pyogenes NOT DETECTED NOT DETECTED Final   A.calcoaceticus-baumannii NOT DETECTED NOT DETECTED Final   Bacteroides fragilis NOT DETECTED NOT DETECTED Final   Enterobacterales DETECTED (A) NOT DETECTED Final    Comment: Enterobacterales represent a large order of gram negative bacteria, not a single organism. CRITICAL RESULT CALLED TO, READ BACK BY AND VERIFIED WITH: PHARMD SYDNEY DAVIS ON 04/05/24 @ 1418 BY DRT    Enterobacter cloacae complex NOT DETECTED NOT DETECTED Final   Escherichia coli DETECTED (A) NOT  DETECTED Final    Comment: CRITICAL RESULT CALLED TO, READ BACK BY AND VERIFIED WITH: PHARMD SYDNEY DAVIS ON 04/05/24 @ 1417 BY DRT    Klebsiella aerogenes NOT DETECTED NOT DETECTED Final   Klebsiella oxytoca NOT DETECTED NOT DETECTED Final   Klebsiella pneumoniae NOT DETECTED NOT DETECTED Final   Proteus species NOT DETECTED NOT DETECTED Final   Salmonella species NOT DETECTED NOT DETECTED Final   Serratia marcescens NOT DETECTED NOT DETECTED Final   Haemophilus influenzae NOT  DETECTED NOT DETECTED Final   Neisseria meningitidis NOT DETECTED NOT DETECTED Final   Pseudomonas aeruginosa NOT DETECTED NOT DETECTED Final   Stenotrophomonas maltophilia NOT DETECTED NOT DETECTED Final   Candida albicans NOT DETECTED NOT DETECTED Final   Candida auris NOT DETECTED NOT DETECTED Final   Candida glabrata NOT DETECTED NOT DETECTED Final   Candida krusei NOT DETECTED NOT DETECTED Final   Candida parapsilosis NOT DETECTED NOT DETECTED Final   Candida tropicalis NOT DETECTED NOT DETECTED Final   Cryptococcus neoformans/gattii NOT DETECTED NOT DETECTED Final   CTX-M ESBL NOT DETECTED NOT DETECTED Final   Carbapenem resistance IMP NOT DETECTED NOT DETECTED Final   Carbapenem resistance KPC NOT DETECTED NOT DETECTED Final   Carbapenem resistance NDM NOT DETECTED NOT DETECTED Final   Carbapenem resist OXA 48 LIKE NOT DETECTED NOT DETECTED Final   Carbapenem resistance VIM NOT DETECTED NOT DETECTED Final    Comment: Performed at [facility], [address], [place], [place] [address]  MRSA Next Gen by PCR, Nasal     Status: None   Collection Time: 04/05/24 12:55 AM   Specimen: Nasal Mucosa; Nasal Swab  Result Value Ref Range Status   MRSA by PCR Next Gen NOT DETECTED NOT DETECTED Final    Comment: (NOTE) The GeneXpert MRSA Assay (FDA approved for NASAL specimens only), is one component of a comprehensive MRSA colonization surveillance program. It is not intended to diagnose MRSA infection nor to guide or monitor treatment for MRSA infections. Test performance is not FDA approved in patients less than 23 years old. Performed at [facility], [address] [place], [place] [address]    Time spent: 0 minutes  Signed: Mennie LAMY, MD 04-13-2024

## 2024-04-12 NOTE — Progress Notes (Signed)
 Pt taken to morgue. SRP,RN

## 2024-04-12 DEATH — deceased

## 2024-04-14 ENCOUNTER — Ambulatory Visit: Admitting: Cardiology
